# Patient Record
Sex: Female | Born: 1955
Health system: Southern US, Community
[De-identification: ages and names within clinical notes are randomized; demographics above are authoritative.]

## PROBLEM LIST (undated history)

## (undated) ENCOUNTER — Emergency Department (HOSPITAL_BASED_OUTPATIENT_CLINIC_OR_DEPARTMENT_OTHER): Admission: EM | Payer: Medicare Other | Source: Home / Self Care

## (undated) DIAGNOSIS — F32A Depression, unspecified: Secondary | ICD-10-CM

## (undated) DIAGNOSIS — F329 Major depressive disorder, single episode, unspecified: Secondary | ICD-10-CM

## (undated) DIAGNOSIS — E785 Hyperlipidemia, unspecified: Secondary | ICD-10-CM

## (undated) DIAGNOSIS — I509 Heart failure, unspecified: Secondary | ICD-10-CM

## (undated) DIAGNOSIS — I219 Acute myocardial infarction, unspecified: Secondary | ICD-10-CM

## (undated) HISTORY — DX: Acute myocardial infarction, unspecified: I21.9

## (undated) HISTORY — DX: Hyperlipidemia, unspecified: E78.5

## (undated) HISTORY — DX: Major depressive disorder, single episode, unspecified: F32.9

## (undated) HISTORY — DX: Depression, unspecified: F32.A

---

## 2002-03-24 ENCOUNTER — Inpatient Hospital Stay (HOSPITAL_COMMUNITY): Admission: AD | Admit: 2002-03-24 | Discharge: 2002-04-07 | Payer: Self-pay | Admitting: *Deleted

## 2002-03-24 ENCOUNTER — Encounter: Payer: Self-pay | Admitting: *Deleted

## 2002-03-25 ENCOUNTER — Encounter: Payer: Self-pay | Admitting: Cardiothoracic Surgery

## 2002-03-28 ENCOUNTER — Encounter: Payer: Self-pay | Admitting: Cardiothoracic Surgery

## 2002-03-29 ENCOUNTER — Encounter: Payer: Self-pay | Admitting: Cardiothoracic Surgery

## 2002-03-30 ENCOUNTER — Encounter: Payer: Self-pay | Admitting: Cardiothoracic Surgery

## 2002-03-31 ENCOUNTER — Encounter: Payer: Self-pay | Admitting: Cardiothoracic Surgery

## 2002-04-01 ENCOUNTER — Encounter: Payer: Self-pay | Admitting: Cardiothoracic Surgery

## 2002-04-03 ENCOUNTER — Encounter: Payer: Self-pay | Admitting: Cardiothoracic Surgery

## 2002-04-04 ENCOUNTER — Encounter: Payer: Self-pay | Admitting: Pulmonary Disease

## 2002-04-05 ENCOUNTER — Encounter: Payer: Self-pay | Admitting: Cardiothoracic Surgery

## 2002-05-12 ENCOUNTER — Encounter: Admission: RE | Admit: 2002-05-12 | Discharge: 2002-05-12 | Payer: Self-pay | Admitting: Cardiothoracic Surgery

## 2002-05-12 ENCOUNTER — Encounter: Payer: Self-pay | Admitting: Cardiothoracic Surgery

## 2002-08-22 ENCOUNTER — Emergency Department (HOSPITAL_COMMUNITY): Admission: EM | Admit: 2002-08-22 | Discharge: 2002-08-22 | Payer: Self-pay | Admitting: Emergency Medicine

## 2011-12-24 DIAGNOSIS — F322 Major depressive disorder, single episode, severe without psychotic features: Secondary | ICD-10-CM | POA: Diagnosis not present

## 2012-02-04 DIAGNOSIS — H1030 Unspecified acute conjunctivitis, unspecified eye: Secondary | ICD-10-CM | POA: Diagnosis not present

## 2012-03-08 DIAGNOSIS — Z954 Presence of other heart-valve replacement: Secondary | ICD-10-CM | POA: Diagnosis not present

## 2012-03-08 DIAGNOSIS — E785 Hyperlipidemia, unspecified: Secondary | ICD-10-CM | POA: Diagnosis not present

## 2012-03-08 DIAGNOSIS — J209 Acute bronchitis, unspecified: Secondary | ICD-10-CM | POA: Diagnosis not present

## 2012-03-08 DIAGNOSIS — M549 Dorsalgia, unspecified: Secondary | ICD-10-CM | POA: Diagnosis not present

## 2012-03-17 DIAGNOSIS — F322 Major depressive disorder, single episode, severe without psychotic features: Secondary | ICD-10-CM | POA: Diagnosis not present

## 2012-03-28 DIAGNOSIS — Z954 Presence of other heart-valve replacement: Secondary | ICD-10-CM | POA: Diagnosis not present

## 2012-03-28 DIAGNOSIS — I2510 Atherosclerotic heart disease of native coronary artery without angina pectoris: Secondary | ICD-10-CM | POA: Diagnosis not present

## 2012-03-28 DIAGNOSIS — J449 Chronic obstructive pulmonary disease, unspecified: Secondary | ICD-10-CM | POA: Diagnosis not present

## 2012-03-28 DIAGNOSIS — Z951 Presence of aortocoronary bypass graft: Secondary | ICD-10-CM | POA: Diagnosis not present

## 2012-03-28 DIAGNOSIS — Z9861 Coronary angioplasty status: Secondary | ICD-10-CM | POA: Diagnosis not present

## 2012-06-22 DIAGNOSIS — E785 Hyperlipidemia, unspecified: Secondary | ICD-10-CM | POA: Diagnosis not present

## 2012-06-22 DIAGNOSIS — Z954 Presence of other heart-valve replacement: Secondary | ICD-10-CM | POA: Diagnosis not present

## 2012-06-22 DIAGNOSIS — I2510 Atherosclerotic heart disease of native coronary artery without angina pectoris: Secondary | ICD-10-CM | POA: Diagnosis not present

## 2012-06-22 DIAGNOSIS — Z7901 Long term (current) use of anticoagulants: Secondary | ICD-10-CM | POA: Diagnosis not present

## 2012-06-22 DIAGNOSIS — I4891 Unspecified atrial fibrillation: Secondary | ICD-10-CM | POA: Diagnosis not present

## 2012-06-22 LAB — CBC AND DIFFERENTIAL
Platelets: 204 10*3/uL (ref 150–399)
WBC: 6.6 10^3/mL

## 2012-06-22 LAB — HEPATIC FUNCTION PANEL: AST: 29 U/L (ref 13–35)

## 2012-06-22 LAB — LIPID PANEL: Cholesterol: 149 mg/dL (ref 0–200)

## 2012-08-12 DIAGNOSIS — Z7901 Long term (current) use of anticoagulants: Secondary | ICD-10-CM | POA: Diagnosis not present

## 2012-08-12 DIAGNOSIS — Z954 Presence of other heart-valve replacement: Secondary | ICD-10-CM | POA: Diagnosis not present

## 2012-08-26 DIAGNOSIS — J069 Acute upper respiratory infection, unspecified: Secondary | ICD-10-CM | POA: Diagnosis not present

## 2012-08-29 DIAGNOSIS — Z124 Encounter for screening for malignant neoplasm of cervix: Secondary | ICD-10-CM | POA: Diagnosis not present

## 2012-08-29 DIAGNOSIS — Z01419 Encounter for gynecological examination (general) (routine) without abnormal findings: Secondary | ICD-10-CM | POA: Diagnosis not present

## 2012-09-08 DIAGNOSIS — F322 Major depressive disorder, single episode, severe without psychotic features: Secondary | ICD-10-CM | POA: Diagnosis not present

## 2012-09-21 DIAGNOSIS — Z1231 Encounter for screening mammogram for malignant neoplasm of breast: Secondary | ICD-10-CM | POA: Diagnosis not present

## 2012-10-12 DIAGNOSIS — Z7901 Long term (current) use of anticoagulants: Secondary | ICD-10-CM | POA: Diagnosis not present

## 2012-10-15 DIAGNOSIS — K219 Gastro-esophageal reflux disease without esophagitis: Secondary | ICD-10-CM | POA: Diagnosis not present

## 2012-10-15 DIAGNOSIS — F411 Generalized anxiety disorder: Secondary | ICD-10-CM | POA: Diagnosis not present

## 2012-10-15 DIAGNOSIS — Z7901 Long term (current) use of anticoagulants: Secondary | ICD-10-CM | POA: Diagnosis not present

## 2012-10-15 DIAGNOSIS — Z79899 Other long term (current) drug therapy: Secondary | ICD-10-CM | POA: Diagnosis not present

## 2012-10-15 DIAGNOSIS — R112 Nausea with vomiting, unspecified: Secondary | ICD-10-CM | POA: Diagnosis not present

## 2012-10-15 DIAGNOSIS — I25810 Atherosclerosis of coronary artery bypass graft(s) without angina pectoris: Secondary | ICD-10-CM | POA: Diagnosis not present

## 2012-10-15 DIAGNOSIS — R109 Unspecified abdominal pain: Secondary | ICD-10-CM | POA: Diagnosis not present

## 2012-10-15 DIAGNOSIS — K5289 Other specified noninfective gastroenteritis and colitis: Secondary | ICD-10-CM | POA: Diagnosis not present

## 2012-10-15 DIAGNOSIS — F17200 Nicotine dependence, unspecified, uncomplicated: Secondary | ICD-10-CM | POA: Diagnosis not present

## 2012-10-15 DIAGNOSIS — R1084 Generalized abdominal pain: Secondary | ICD-10-CM | POA: Diagnosis not present

## 2012-10-15 DIAGNOSIS — Z885 Allergy status to narcotic agent status: Secondary | ICD-10-CM | POA: Diagnosis not present

## 2012-10-15 DIAGNOSIS — Z881 Allergy status to other antibiotic agents status: Secondary | ICD-10-CM | POA: Diagnosis not present

## 2012-10-15 DIAGNOSIS — F329 Major depressive disorder, single episode, unspecified: Secondary | ICD-10-CM | POA: Diagnosis not present

## 2012-10-15 DIAGNOSIS — R197 Diarrhea, unspecified: Secondary | ICD-10-CM | POA: Diagnosis not present

## 2012-10-15 DIAGNOSIS — K2990 Gastroduodenitis, unspecified, without bleeding: Secondary | ICD-10-CM | POA: Diagnosis not present

## 2012-10-15 DIAGNOSIS — K2970 Gastritis, unspecified, without bleeding: Secondary | ICD-10-CM | POA: Diagnosis not present

## 2012-10-26 DIAGNOSIS — M549 Dorsalgia, unspecified: Secondary | ICD-10-CM | POA: Diagnosis not present

## 2012-10-26 DIAGNOSIS — R110 Nausea: Secondary | ICD-10-CM | POA: Diagnosis not present

## 2012-11-04 DIAGNOSIS — Z902 Acquired absence of lung [part of]: Secondary | ICD-10-CM | POA: Diagnosis not present

## 2012-11-04 DIAGNOSIS — E876 Hypokalemia: Secondary | ICD-10-CM | POA: Diagnosis not present

## 2012-11-04 DIAGNOSIS — I079 Rheumatic tricuspid valve disease, unspecified: Secondary | ICD-10-CM | POA: Diagnosis present

## 2012-11-04 DIAGNOSIS — Z9089 Acquired absence of other organs: Secondary | ICD-10-CM | POA: Diagnosis not present

## 2012-11-04 DIAGNOSIS — R110 Nausea: Secondary | ICD-10-CM | POA: Diagnosis not present

## 2012-11-04 DIAGNOSIS — R1084 Generalized abdominal pain: Secondary | ICD-10-CM | POA: Diagnosis not present

## 2012-11-04 DIAGNOSIS — J45909 Unspecified asthma, uncomplicated: Secondary | ICD-10-CM | POA: Diagnosis present

## 2012-11-04 DIAGNOSIS — F329 Major depressive disorder, single episode, unspecified: Secondary | ICD-10-CM | POA: Diagnosis present

## 2012-11-04 DIAGNOSIS — I10 Essential (primary) hypertension: Secondary | ICD-10-CM | POA: Diagnosis present

## 2012-11-04 DIAGNOSIS — R791 Abnormal coagulation profile: Secondary | ICD-10-CM | POA: Diagnosis present

## 2012-11-04 DIAGNOSIS — F411 Generalized anxiety disorder: Secondary | ICD-10-CM | POA: Diagnosis present

## 2012-11-04 DIAGNOSIS — M2550 Pain in unspecified joint: Secondary | ICD-10-CM | POA: Diagnosis not present

## 2012-11-04 DIAGNOSIS — E785 Hyperlipidemia, unspecified: Secondary | ICD-10-CM | POA: Diagnosis present

## 2012-11-04 DIAGNOSIS — M6789 Other specified disorders of synovium and tendon, multiple sites: Secondary | ICD-10-CM | POA: Diagnosis not present

## 2012-11-04 DIAGNOSIS — R112 Nausea with vomiting, unspecified: Secondary | ICD-10-CM | POA: Diagnosis not present

## 2012-11-04 DIAGNOSIS — Z7982 Long term (current) use of aspirin: Secondary | ICD-10-CM | POA: Diagnosis not present

## 2012-11-04 DIAGNOSIS — Z951 Presence of aortocoronary bypass graft: Secondary | ICD-10-CM | POA: Diagnosis not present

## 2012-11-04 DIAGNOSIS — J438 Other emphysema: Secondary | ICD-10-CM | POA: Diagnosis present

## 2012-11-04 DIAGNOSIS — R109 Unspecified abdominal pain: Secondary | ICD-10-CM | POA: Diagnosis not present

## 2012-11-04 DIAGNOSIS — I2789 Other specified pulmonary heart diseases: Secondary | ICD-10-CM | POA: Diagnosis not present

## 2012-11-04 DIAGNOSIS — T45511A Poisoning by anticoagulants, accidental (unintentional), initial encounter: Secondary | ICD-10-CM | POA: Diagnosis not present

## 2012-11-04 DIAGNOSIS — R197 Diarrhea, unspecified: Secondary | ICD-10-CM | POA: Diagnosis not present

## 2012-11-04 DIAGNOSIS — I2510 Atherosclerotic heart disease of native coronary artery without angina pectoris: Secondary | ICD-10-CM | POA: Diagnosis present

## 2012-11-04 DIAGNOSIS — Z952 Presence of prosthetic heart valve: Secondary | ICD-10-CM | POA: Diagnosis not present

## 2012-11-04 DIAGNOSIS — I2589 Other forms of chronic ischemic heart disease: Secondary | ICD-10-CM | POA: Diagnosis present

## 2012-11-04 DIAGNOSIS — R82998 Other abnormal findings in urine: Secondary | ICD-10-CM | POA: Diagnosis not present

## 2012-11-04 DIAGNOSIS — R233 Spontaneous ecchymoses: Secondary | ICD-10-CM | POA: Diagnosis not present

## 2012-11-04 DIAGNOSIS — M76899 Other specified enthesopathies of unspecified lower limb, excluding foot: Secondary | ICD-10-CM | POA: Diagnosis present

## 2012-11-04 DIAGNOSIS — I059 Rheumatic mitral valve disease, unspecified: Secondary | ICD-10-CM | POA: Diagnosis present

## 2012-11-04 DIAGNOSIS — N10 Acute tubulo-interstitial nephritis: Secondary | ICD-10-CM | POA: Diagnosis not present

## 2012-11-04 DIAGNOSIS — M25559 Pain in unspecified hip: Secondary | ICD-10-CM | POA: Diagnosis not present

## 2012-11-04 DIAGNOSIS — R935 Abnormal findings on diagnostic imaging of other abdominal regions, including retroperitoneum: Secondary | ICD-10-CM | POA: Diagnosis not present

## 2012-11-04 DIAGNOSIS — F17200 Nicotine dependence, unspecified, uncomplicated: Secondary | ICD-10-CM | POA: Diagnosis not present

## 2012-11-04 DIAGNOSIS — N12 Tubulo-interstitial nephritis, not specified as acute or chronic: Secondary | ICD-10-CM | POA: Diagnosis not present

## 2012-11-04 DIAGNOSIS — R310 Gross hematuria: Secondary | ICD-10-CM | POA: Diagnosis present

## 2012-11-04 DIAGNOSIS — R1032 Left lower quadrant pain: Secondary | ICD-10-CM | POA: Diagnosis not present

## 2012-11-04 DIAGNOSIS — N23 Unspecified renal colic: Secondary | ICD-10-CM | POA: Diagnosis not present

## 2012-11-04 DIAGNOSIS — R1110 Vomiting, unspecified: Secondary | ICD-10-CM | POA: Diagnosis not present

## 2012-11-14 DIAGNOSIS — R319 Hematuria, unspecified: Secondary | ICD-10-CM | POA: Diagnosis not present

## 2012-11-14 DIAGNOSIS — Z954 Presence of other heart-valve replacement: Secondary | ICD-10-CM | POA: Diagnosis not present

## 2012-11-14 DIAGNOSIS — Z7901 Long term (current) use of anticoagulants: Secondary | ICD-10-CM | POA: Diagnosis not present

## 2012-11-21 DIAGNOSIS — Z954 Presence of other heart-valve replacement: Secondary | ICD-10-CM | POA: Diagnosis not present

## 2012-11-21 DIAGNOSIS — Z7901 Long term (current) use of anticoagulants: Secondary | ICD-10-CM | POA: Diagnosis not present

## 2012-12-08 DIAGNOSIS — Z954 Presence of other heart-valve replacement: Secondary | ICD-10-CM | POA: Diagnosis not present

## 2012-12-08 DIAGNOSIS — Z7901 Long term (current) use of anticoagulants: Secondary | ICD-10-CM | POA: Diagnosis not present

## 2012-12-09 DIAGNOSIS — R109 Unspecified abdominal pain: Secondary | ICD-10-CM | POA: Diagnosis not present

## 2012-12-09 DIAGNOSIS — R110 Nausea: Secondary | ICD-10-CM | POA: Diagnosis not present

## 2012-12-23 DIAGNOSIS — K209 Esophagitis, unspecified without bleeding: Secondary | ICD-10-CM | POA: Diagnosis not present

## 2012-12-23 DIAGNOSIS — R1013 Epigastric pain: Secondary | ICD-10-CM | POA: Diagnosis not present

## 2012-12-23 DIAGNOSIS — K259 Gastric ulcer, unspecified as acute or chronic, without hemorrhage or perforation: Secondary | ICD-10-CM | POA: Diagnosis not present

## 2012-12-23 DIAGNOSIS — R110 Nausea: Secondary | ICD-10-CM | POA: Diagnosis not present

## 2012-12-23 DIAGNOSIS — K3189 Other diseases of stomach and duodenum: Secondary | ICD-10-CM | POA: Diagnosis not present

## 2012-12-23 DIAGNOSIS — K2960 Other gastritis without bleeding: Secondary | ICD-10-CM | POA: Diagnosis not present

## 2012-12-27 DIAGNOSIS — Z7901 Long term (current) use of anticoagulants: Secondary | ICD-10-CM | POA: Diagnosis not present

## 2012-12-27 DIAGNOSIS — Z954 Presence of other heart-valve replacement: Secondary | ICD-10-CM | POA: Diagnosis not present

## 2013-01-16 DIAGNOSIS — Z7901 Long term (current) use of anticoagulants: Secondary | ICD-10-CM | POA: Diagnosis not present

## 2013-01-16 DIAGNOSIS — Z954 Presence of other heart-valve replacement: Secondary | ICD-10-CM | POA: Diagnosis not present

## 2013-01-18 DIAGNOSIS — R319 Hematuria, unspecified: Secondary | ICD-10-CM | POA: Diagnosis not present

## 2013-01-18 DIAGNOSIS — N329 Bladder disorder, unspecified: Secondary | ICD-10-CM | POA: Diagnosis not present

## 2013-01-24 DIAGNOSIS — N281 Cyst of kidney, acquired: Secondary | ICD-10-CM | POA: Diagnosis not present

## 2013-01-24 DIAGNOSIS — R319 Hematuria, unspecified: Secondary | ICD-10-CM | POA: Diagnosis not present

## 2013-01-31 ENCOUNTER — Encounter: Payer: Self-pay | Admitting: Family Medicine

## 2013-01-31 ENCOUNTER — Ambulatory Visit (INDEPENDENT_AMBULATORY_CARE_PROVIDER_SITE_OTHER): Payer: Self-pay | Admitting: Family Medicine

## 2013-01-31 VITALS — BP 113/64 | HR 71 | Ht 63.25 in | Wt 164.0 lb

## 2013-01-31 DIAGNOSIS — E876 Hypokalemia: Secondary | ICD-10-CM | POA: Diagnosis not present

## 2013-01-31 DIAGNOSIS — K219 Gastro-esophageal reflux disease without esophagitis: Secondary | ICD-10-CM | POA: Insufficient documentation

## 2013-01-31 DIAGNOSIS — M549 Dorsalgia, unspecified: Secondary | ICD-10-CM | POA: Insufficient documentation

## 2013-01-31 DIAGNOSIS — E785 Hyperlipidemia, unspecified: Secondary | ICD-10-CM

## 2013-01-31 DIAGNOSIS — Z9889 Other specified postprocedural states: Secondary | ICD-10-CM

## 2013-01-31 DIAGNOSIS — Z7901 Long term (current) use of anticoagulants: Secondary | ICD-10-CM

## 2013-01-31 DIAGNOSIS — F419 Anxiety disorder, unspecified: Secondary | ICD-10-CM

## 2013-01-31 DIAGNOSIS — Z954 Presence of other heart-valve replacement: Secondary | ICD-10-CM

## 2013-01-31 DIAGNOSIS — Z952 Presence of prosthetic heart valve: Secondary | ICD-10-CM | POA: Insufficient documentation

## 2013-01-31 DIAGNOSIS — Z131 Encounter for screening for diabetes mellitus: Secondary | ICD-10-CM | POA: Diagnosis not present

## 2013-01-31 DIAGNOSIS — M25569 Pain in unspecified knee: Secondary | ICD-10-CM

## 2013-01-31 DIAGNOSIS — F17200 Nicotine dependence, unspecified, uncomplicated: Secondary | ICD-10-CM

## 2013-01-31 DIAGNOSIS — Z951 Presence of aortocoronary bypass graft: Secondary | ICD-10-CM | POA: Insufficient documentation

## 2013-01-31 DIAGNOSIS — G8929 Other chronic pain: Secondary | ICD-10-CM | POA: Insufficient documentation

## 2013-01-31 DIAGNOSIS — F32A Depression, unspecified: Secondary | ICD-10-CM

## 2013-01-31 DIAGNOSIS — M25561 Pain in right knee: Secondary | ICD-10-CM | POA: Insufficient documentation

## 2013-01-31 DIAGNOSIS — F341 Dysthymic disorder: Secondary | ICD-10-CM

## 2013-01-31 DIAGNOSIS — K589 Irritable bowel syndrome without diarrhea: Secondary | ICD-10-CM | POA: Insufficient documentation

## 2013-01-31 DIAGNOSIS — F329 Major depressive disorder, single episode, unspecified: Secondary | ICD-10-CM

## 2013-01-31 DIAGNOSIS — I2510 Atherosclerotic heart disease of native coronary artery without angina pectoris: Secondary | ICD-10-CM

## 2013-01-31 LAB — POCT INR: INR: 3.1

## 2013-01-31 NOTE — Progress Notes (Signed)
CC: Janet Joyce is a 57 y.o. female is here for Establish Care   Subjective: HPI:  Patient presents to establish care with no acute complaints  Chronic back pain: She describes chronic axial low back pain that she's been receiving hydrocodone and oxycodone from her prior provider. She tells me she has a pain management clinic appointment in early March oh prefer to have a PCP managing her narcotics. She denies change in the severity or character of the pain over the last year. She denies motor sensory disturbances, bowel or bladder dysfunction, nor saddle paresthesia. She denies a chronic knee and shoulder pain. She describes the pain as moderate in severity and worse with inactivity, slightly improves with activity. Denies weakness. Admits to remote history of giving way.  Reports a history of coronary artery disease currently seeing Dr. Leeann Must. She states she has angina on a weekly basis and has not changed in character or severity. She tells me that she sees Dr. Leeann Must frequently and they're currently managing this medically. She is taking and/or and a beta blocker. She takes aspirin daily. She is on statin   History of hypercholesterolemia: She is unsure what her most recent LDL was. She's pretty sure that prior lipid panels were not done fasting. She is taking Crestor without right upper quadrant pain, mild as, nor skin or scleral discoloration. She denies limb claudication  History of aortic valve replacement. She's requesting Coumadin management to be performed by our office. She tells me that about a month ago her INR was 20 and she was bleeding from the eyes. She was hospitalized but is unsure what happened during this hospitalization. Most recent INR was 4.9 on the third of this month. She has been taking 2.5 mg of Coumadin on a daily basis except for Sunday for the past week and a half. She denies bleeding or bruisability for the past 2 weeks.  History of anxiety and depression  which she states is fantastic and well controlled currently being managed by her psychiatrist. Denies mental disturbance currently  History of hypokalemia: She is taking potassium on a daily basis but only taking Lasix intermittently for peripheral edema. She's unsure when her potassium level was checked last. She denies regular heartbeat, also spasm, muscle cramps, weakness, nor confusion.  Review of Systems - General ROS: negative for - chills, fever, night sweats, weight gain or weight loss Ophthalmic ROS: negative for - decreased vision Psychological ROS: negative for - worsening anxiety or depression ENT ROS: negative for - hearing change, nasal congestion, tinnitus or allergies Hematological and Lymphatic ROS: negative for - bleeding problems, bruising or swollen lymph nodes other than that described above Breast ROS: negative Respiratory ROS: no cough, shortness of breath, or wheezing Cardiovascular ROS: No worsening angina Gastrointestinal ROS: no abdominal pain, change in bowel habits, or black or bloody stools Genito-Urinary ROS: negative for - genital discharge, genital ulcers, incontinence or abnormal bleeding from genitals Musculoskeletal ROS: negative for - joint pain or muscle pain other than that described above Neurological ROS: negative for - headaches or memory loss Dermatological ROS: negative for lumps, mole changes, rash and skin lesion changes  Past Medical History  Diagnosis Date  . Heart attack   . Hyperlipidemia   . Depression      Family History  Problem Relation Age of Onset  . Diabetes Father     other than father with history of type 2 diabetes  History  Substance Use Topics  . Smoking status: Current Every Day  Smoker -- 0.50 packs/day    Types: Cigarettes  . Smokeless tobacco: Not on file  . Alcohol Use: No     Objective: Filed Vitals:   01/31/13 1401  BP: 113/64  Pulse: 71    General: Alert and Oriented, No Acute Distress HEENT: Pupils  equal, round, reactive to light. Conjunctivae clear.  External ears unremarkable, canals clear with intact TMs with appropriate landmarks.  Middle ear appears open without effusion. Pink inferior turbinates.  Moist mucous membranes, pharynx without inflammation nor lesions.  Neck supple without palpable lymphadenopathy nor abnormal masses. Lungs: Clear to auscultation bilaterally, no wheezing/ronchi/rales.  Comfortable work of breathing. Good air movement. Cardiac: Regular rate and rhythm.  Hyperplastic S1 normal S2.  No murmurs, rubs, nor gallops.   Abdomen: Normal bowel sounds, soft and non tender without palpable masses. Extremities: No peripheral edema.  Strong peripheral pulses.  Mental Status: No depression, anxiety, nor agitation. Skin: Warm and dry.  Assessment & Plan: Deborha was seen today for establish care.  Diagnoses and associated orders for this visit:  H/O aortic valve replacement - POCT INR  CAD (coronary artery disease)  Hyperlipidemia LDL goal <70 - Lipid panel - COMPLETE METABOLIC PANEL WITH GFR  Anxiety and depression  Hypokalemia - COMPLETE METABOLIC PANEL WITH GFR  Diabetes mellitus screening - COMPLETE METABOLIC PANEL WITH GFR  Long-term (current) use of anticoagulants - CBC  Status post lung surgery  Tobacco use disorder  History of aortic valve replacement: Stable: INR today 3.1 at goal. Continue Coumadin 2.5 mg return in one week for recheck. Coronary artery disease: Stable, agree with medication regimen of beta blocker, statin, aspirin, nitrates, continue care with Dr. Leeann Must. Hyperlipidemia: Obtaining fasting lipid panel tomorrow Anxiety and depression: Stable, we'll defer care to her psychiatrist Hypokalemia: Will check potassium since she does not take potassium only with Lasix. Diabetes screening due Chronic back pain: Discussed that I prefer axial back pain to be managed by pain management clinic I be happy to help her with nonnarcotic  interventions Bilateral knee pain: Asked her to return 1 week and we can consider giving steroid injection to one of the knees.  45 minutes spent face-to-face during visit today of which at least 50% was counseling or coordinating care regarding chronic back pain: Bilateral knee pain, hypokalemia, anxiety depression, hyperlipidemia, coronary artery disease, history of aortic valve replacement requiring anticoagulation.    Return in about 1 week (around 02/07/2013).

## 2013-02-03 DIAGNOSIS — E785 Hyperlipidemia, unspecified: Secondary | ICD-10-CM | POA: Diagnosis not present

## 2013-02-03 LAB — COMPLETE METABOLIC PANEL WITHOUT GFR
ALT: 18 U/L (ref 0–35)
AST: 29 U/L (ref 0–37)
Albumin: 4.1 g/dL (ref 3.5–5.2)
Alkaline Phosphatase: 71 U/L (ref 39–117)
BUN: 9 mg/dL (ref 6–23)
CO2: 26 meq/L (ref 19–32)
Calcium: 9.4 mg/dL (ref 8.4–10.5)
Chloride: 105 meq/L (ref 96–112)
Creat: 0.75 mg/dL (ref 0.50–1.10)
GFR, Est African American: >89 mL/min
GFR, Est Non African American: >89 mL/min
Glucose, Bld: 104 mg/dL — ABNORMAL HIGH (ref 70–99)
Potassium: 4.2 meq/L (ref 3.5–5.3)
Sodium: 139 meq/L (ref 135–145)
Total Bilirubin: 0.4 mg/dL (ref 0.3–1.2)
Total Protein: 6.5 g/dL (ref 6.0–8.3)

## 2013-02-03 LAB — CBC
HCT: 43.8 % (ref 36.0–46.0)
Hemoglobin: 14.2 g/dL (ref 12.0–15.0)
MCH: 26.3 pg (ref 26.0–34.0)
RBC: 5.39 MIL/uL — ABNORMAL HIGH (ref 3.87–5.11)

## 2013-02-03 LAB — LIPID PANEL
Cholesterol: 146 mg/dL (ref 0–200)
HDL: 37 mg/dL — ABNORMAL LOW
LDL Cholesterol: 62 mg/dL (ref 0–99)
Total CHOL/HDL Ratio: 3.9 ratio
Triglycerides: 234 mg/dL — ABNORMAL HIGH
VLDL: 47 mg/dL — ABNORMAL HIGH (ref 0–40)

## 2013-02-06 ENCOUNTER — Encounter: Payer: Self-pay | Admitting: Family Medicine

## 2013-02-06 ENCOUNTER — Telehealth: Payer: Self-pay | Admitting: Family Medicine

## 2013-02-06 MED ORDER — NIACIN ER (ANTIHYPERLIPIDEMIC) 1000 MG PO TBCR: 1000.0000 mg | EXTENDED_RELEASE_TABLET | Freq: Every day | ORAL | Status: DC

## 2013-02-06 NOTE — Telephone Encounter (Signed)
Left message on vm

## 2013-02-06 NOTE — Telephone Encounter (Signed)
Sue Lush, Will you please let mrs. Cronic know that her LDL-cholesterol looks fantastic, well below a goal of 70.  Her triglycerides are elevated but she's likely getting some benefit from the cholesterol medication she's currently on.  I'd like her to adjust her niacin to an extended release formulation with a little higher dose to help with the triglycerides.  I have sent an Rx to her [facility], this new rx will take the place of the niacin she's been taking.

## 2013-02-07 ENCOUNTER — Ambulatory Visit (INDEPENDENT_AMBULATORY_CARE_PROVIDER_SITE_OTHER): Payer: Medicare Other | Admitting: Family Medicine

## 2013-02-07 ENCOUNTER — Encounter: Payer: Self-pay | Admitting: Family Medicine

## 2013-02-07 VITALS — BP 132/74 | HR 67 | Wt 163.0 lb

## 2013-02-07 DIAGNOSIS — M76899 Other specified enthesopathies of unspecified lower limb, excluding foot: Secondary | ICD-10-CM | POA: Diagnosis not present

## 2013-02-07 DIAGNOSIS — M7061 Trochanteric bursitis, right hip: Secondary | ICD-10-CM

## 2013-02-07 DIAGNOSIS — Z952 Presence of prosthetic heart valve: Secondary | ICD-10-CM

## 2013-02-07 DIAGNOSIS — E781 Pure hyperglyceridemia: Secondary | ICD-10-CM

## 2013-02-07 DIAGNOSIS — R7301 Impaired fasting glucose: Secondary | ICD-10-CM | POA: Diagnosis not present

## 2013-02-07 MED ORDER — WARFARIN SODIUM 2 MG PO TABS: ORAL_TABLET | ORAL | Status: DC

## 2013-02-07 NOTE — Patient Instructions (Addendum)
Current Coumadin Dosing Instructions: Alternate Coumadin 2.5mg  dose with 2.0mg  dose (called in to [facility]) every other day.  Repeat INR in 1 week with nurse visit.  New triglyceride medication: Niacin 1000mg  CR taken every evening before bed.  If this causes flushing take your daily aspirin along with this medication.

## 2013-02-07 NOTE — Progress Notes (Signed)
CC: Janet Joyce is a 57 y.o. female is here for Knee Pain and Coagulation Disorder   Subjective: HPI:  Aortic Valve Replacement: INR 3.1 last week, today 3.6 while taking Coumadin 2.5mg  daily. Denies missed doses, bleeding, bruising, nor new motor or sensory disturbances. Denies shortness of breath or chest pain.  At her last visit she was found to have triglycerides in the mid 200s while on statin, her LDL is below goal of 70. Med list reflected that she was on niacin however during discussion today she is pretty certain that she is never taken this medication before. She tries to watch her she eats, no formal exercise program. No recent elevated LFTs nor known history of fatty liver disease. Denies epigastric pain nor right upper quadrant pain, no skin or scleral discoloration.  At her last visit fasting glucose was just above 100. She does not believe that her glucoses never been elevated before. Family history significant for father with type 2 diabetes. She denies polyphagia polyuria nor polydipsia. She denies poorly healing wounds nor tingling or numbness of the extremities.  She complains of bilateral hip pain. Moderate in severity, worse after walking long distances or immediately when rolling over in her sleep. Pain is localized on the lateral aspects of her hips bilaterally the upper femur and she denies any groin pain or buttock pain. Pain often radiates down the lateral side of her femur. She admits that she does have a snapping sensation occasionally with the pain while walking. This pain has been present for months to years and she believes she may have had a steroid injection, she's not sure, but is sure it was over a year ago. She denies motor or sensory disturbances in the lower extremities. She denies any recent or remote trauma to the lower extremities or pelvis.  Review Of Systems Outlined In HPI  Past Medical History  Diagnosis Date  . Heart attack   . Hyperlipidemia   .  Depression      Family History  Problem Relation Age of Onset  . Diabetes Father      History  Substance Use Topics  . Smoking status: Current Every Day Smoker -- 0.50 packs/day    Types: Cigarettes  . Smokeless tobacco: Not on file  . Alcohol Use: No     Objective: Filed Vitals:   02/07/13 1142  BP: 132/74  Pulse: 67    General: Alert and Oriented, No Acute Distress HEENT: Pupils equal, round, reactive to light. Conjunctivae clear.  Moist mucous membranes, pharynx clear Lungs: Clear to auscultation bilaterally, no wheezing/ronchi/rales.  Comfortable work of breathing. Good air movement. Cardiac: Regular rate and rhythm. Mechanical S2.  No murmurs, rubs, nor gallops.   Abdomen: Obese soft nontender Extremities: No peripheral edema.  Strong peripheral pulses. Right lower extremity: FABER and FADIR negative, Straight leg raise negative, no pain with internal or external rotation of the femur, she has full range of motion strength in the right lower extremity with symmetric internal and external rotation range of motion laterally, L4-S1 DTRs in the right lower extremities two over four. Pain is reproduced with palpation of greater trochanter on the right. Mental Status: No depression, anxiety, nor agitation. Skin: Warm and dry.  Assessment & Plan: Janet Joyce was seen today for knee pain and coagulation disorder.  Diagnoses and associated orders for this visit:  H/O aortic valve replacement - warfarin (COUMADIN) 2 MG tablet; Alternate one 2mg  tab with one 2.5mg  dose every other day.  Hypertriglyceridemia  Elevated  fasting blood sugar - HgB A1c  Greater trochanteric bursitis, right    Anticoagulation for aortic valve replacement: Supratherapeutic at 3.6 with goal INR 2.5-3.5. Lowering Coumadin regimen to 2 mg and 2.5 mg alternating every other day. Repeat INR one week Hypertriglyceridemia: Uncontrolled, Start nightly niacin Elevated fasting blood sugar: Uncontrolled,  Obtaining hemoglobin A1c to rule out type 2 diabetes Right greater trochanteric bursitis: Patient is open to the idea of steroid injection, please see procedure note below.    Return in about 1 week (around 02/14/2013).   Bursa Aspiration with Injection Procedure Note  Pre-operative Diagnosis: right Trochanteric bursitis  Post-operative Diagnosis: same  Indications: Diagnosis and treatment of symptomatic bursal effusion  Anesthesia: cold topical spray   Procedure Details   After a discussion of the risks and benefits with the patient (including the possibility that any manipulation of the bursa could introduce infection, worsening the current situation significantly), verbal consent was obtained for the procedure. The joint was prepped with alcohol and aaerosol cold spray was applied. An 21 gauge needle was introduced into the fluid collection and 1 ml of serosanguinous fluid was removed and discarded. After the fluid was removed, 2 ml of triamcinolone (KENALOG) 40mg /ml was injected into the bursa with 3mL 2% Lidocaine. The injection site was cleansed with topical isopropyl alcohol and a dressing was applied.  Complications:  None; patient tolerated the procedure well.

## 2013-02-13 ENCOUNTER — Telehealth: Payer: Self-pay | Admitting: *Deleted

## 2013-02-13 DIAGNOSIS — E781 Pure hyperglyceridemia: Secondary | ICD-10-CM

## 2013-02-13 MED ORDER — FENOFIBRATE 48 MG PO TABS: 48.0000 mg | ORAL_TABLET | Freq: Every day | ORAL | Status: DC

## 2013-02-13 NOTE — Telephone Encounter (Signed)
Pt.notified

## 2013-02-13 NOTE — Telephone Encounter (Signed)
Patient calls and states you put her on Niaspan and has been having night sweats, SOB, muscle pains, abdominal cramping ever since and can not take this. Please advise

## 2013-02-13 NOTE — Telephone Encounter (Signed)
Sue Lush, Will you please let Mrs. Jaskot know that she can stop the niaspan immediately if she'd like and I'm sorry it caused her such significant side effects.  I've sent in an alternative medication called Tricor/fenofibrate that works differently than Niaspan but will still address her triglycerides.

## 2013-02-15 ENCOUNTER — Telehealth: Payer: Self-pay | Admitting: *Deleted

## 2013-02-15 DIAGNOSIS — G894 Chronic pain syndrome: Secondary | ICD-10-CM | POA: Diagnosis not present

## 2013-02-15 DIAGNOSIS — R0789 Other chest pain: Secondary | ICD-10-CM | POA: Diagnosis not present

## 2013-02-15 NOTE — Telephone Encounter (Signed)
Patient calls and wanted to know why her Klonopin was denied. She states that when in for appt that you said you would refill it. She does see a psychiatrist but states that he does not prescribe this for her

## 2013-02-16 MED ORDER — CLONAZEPAM 0.5 MG PO TABS: ORAL_TABLET | ORAL | Status: DC

## 2013-02-16 NOTE — Telephone Encounter (Signed)
Pt.notified

## 2013-02-16 NOTE — Telephone Encounter (Signed)
Sue Lush, Will you let Mrs. Fanara know that I can only assume that our system assumed her psych was prescribing this and caused it to get blocked.  I've printed an rx and placed in your box to be sent or picke dup.

## 2013-02-20 ENCOUNTER — Encounter: Payer: Self-pay | Admitting: Family Medicine

## 2013-02-20 ENCOUNTER — Ambulatory Visit (INDEPENDENT_AMBULATORY_CARE_PROVIDER_SITE_OTHER): Payer: Medicare Other | Admitting: Family Medicine

## 2013-02-20 VITALS — BP 129/73 | HR 62 | Wt 158.0 lb

## 2013-02-20 DIAGNOSIS — E781 Pure hyperglyceridemia: Secondary | ICD-10-CM

## 2013-02-20 DIAGNOSIS — R7301 Impaired fasting glucose: Secondary | ICD-10-CM | POA: Diagnosis not present

## 2013-02-20 DIAGNOSIS — G8929 Other chronic pain: Secondary | ICD-10-CM

## 2013-02-20 DIAGNOSIS — Z7901 Long term (current) use of anticoagulants: Secondary | ICD-10-CM

## 2013-02-20 DIAGNOSIS — Z954 Presence of other heart-valve replacement: Secondary | ICD-10-CM

## 2013-02-20 DIAGNOSIS — G894 Chronic pain syndrome: Secondary | ICD-10-CM

## 2013-02-20 DIAGNOSIS — Z952 Presence of prosthetic heart valve: Secondary | ICD-10-CM

## 2013-02-20 DIAGNOSIS — I4891 Unspecified atrial fibrillation: Secondary | ICD-10-CM | POA: Insufficient documentation

## 2013-02-20 LAB — HEMOGLOBIN A1C: Mean Plasma Glucose: 126 mg/dL — ABNORMAL HIGH (ref <117)

## 2013-02-20 MED ORDER — WARFARIN SODIUM 5 MG PO TABS: 2.5000 mg | ORAL_TABLET | Freq: Every day | ORAL | Status: DC

## 2013-02-20 MED ORDER — OXYCODONE-ACETAMINOPHEN 10-325 MG PO TABS: 1.0000 | ORAL_TABLET | Freq: Four times a day (QID) | ORAL | Status: DC | PRN

## 2013-02-20 NOTE — Progress Notes (Signed)
CC: Janet Joyce is a 57 y.o. female is here for Coagulation Disorder and Follow-up   Subjective: HPI:  Followup hypertriglyceridemia: Patient reports niacin caused flushing and nausea which was intolerable. She has been on fenofibrate for 4 days now without any side effects. She denies right upper quadrant pain, skin or scleral discoloration, nor myalgias.  Followup hyperglycemia: She has yet to have an A1c drawn. Reviewed outside records revealing glucose values in the mid 100s however uncertain whether or not she was fasting for these episodes. She denies polyuria polyphasia or polydipsia. Denies vision loss, poorly healing wounds, nor motor or sensory disturbances.  Followup history of aortic valve replacement: She has been taking 2 mg and 2.5 mg of Coumadin on alternating every other day basis. No other medications have been adjusted other than starting fenofibrate. She denies dietary changes. Denies missed doses. Denies confusion, nor motor or sensory disturbances ear denies bleeding or easy bruisability.  Followup chronic pain: She reports being seen at a local pain management clinic and per her report she was told that they would not be able to help her until she "fixed" her pain medication regimen. She is pretty confused as to what exactly they expected from her prior to her next visit. She plans on discontinuing going to their office. She reports continued diffuse joint pain has not changed in character or severity in years. She's been out of opiates since the weekend.     Review Of Systems Outlined In HPI  Past Medical History  Diagnosis Date  . Heart attack   . Hyperlipidemia   . Depression      Family History  Problem Relation Age of Onset  . Diabetes Father      History  Substance Use Topics  . Smoking status: Current Every Day Smoker -- 0.50 packs/day    Types: Cigarettes  . Smokeless tobacco: Not on file  . Alcohol Use: No     Objective: Filed Vitals:   02/20/13 1123  BP: 129/73  Pulse: 62    General: Alert and Oriented, No Acute Distress HEENT: Pupils equal, round, reactive to light. Conjunctivae clear.  Moist membranes Lungs: Clear to auscultation bilaterally, no wheezing/ronchi/rales.  Comfortable work of breathing. Good air movement. Cardiac: Regular rate and rhythm. Mechanical S2.  No murmurs, rubs, nor gallops.   Extremities: No peripheral edema.  Strong peripheral pulses. Neuro: Cranial nerves II through XII grossly intact  Mental Status: No depression, anxiety, nor agitation. Skin: Warm and dry.  Assessment & Plan: Janet Joyce was seen today for coagulation disorder and follow-up.  Diagnoses and associated orders for this visit:  H/O aortic valve replacement - POCT INR - warfarin (COUMADIN) 5 MG tablet; Take 0.5 tablets (2.5 mg total) by mouth daily.  Elevated fasting blood sugar  Chronic pain syndrome  Hypertriglyceridemia  Other Orders - oxyCODONE-acetaminophen (PERCOCET) 10-325 MG per tablet; Take 1 tablet by mouth every 6 (six) hours as needed for pain.    History aortic valve replacement: Uncontrolled anticoagulation,Subtherapeutic INR today therefore increasing daily Coumadin to 2.5 mg. Repeat INR one week Elevated fasting blood sugar: She will have an A1c obtained today to determine if she has type 2 diabetes Chronic pain syndrome: Controlled and stable, Discussed my disagreement with her prior providers regimen of hydrocodone and oxycodone. We'll be willing to help wean her down using oxycodone/acetaminophen. Hypertriglyceridemia: Clinically stable, continue fenofibrate will recheck lipids 2 months.   Return in about 1 week (around 02/27/2013) for INR.

## 2013-02-21 ENCOUNTER — Encounter: Payer: Self-pay | Admitting: Family Medicine

## 2013-02-21 DIAGNOSIS — R7303 Prediabetes: Secondary | ICD-10-CM | POA: Insufficient documentation

## 2013-02-24 ENCOUNTER — Encounter: Payer: Self-pay | Admitting: *Deleted

## 2013-02-27 ENCOUNTER — Ambulatory Visit: Payer: Medicare Other

## 2013-03-01 ENCOUNTER — Telehealth: Payer: Self-pay | Admitting: *Deleted

## 2013-03-01 ENCOUNTER — Ambulatory Visit (INDEPENDENT_AMBULATORY_CARE_PROVIDER_SITE_OTHER): Payer: Medicare Other | Admitting: Family Medicine

## 2013-03-01 VITALS — BP 92/65 | HR 69

## 2013-03-01 DIAGNOSIS — I4891 Unspecified atrial fibrillation: Secondary | ICD-10-CM | POA: Diagnosis not present

## 2013-03-01 MED ORDER — WARFARIN SODIUM 3 MG PO TABS: 3.0000 mg | ORAL_TABLET | Freq: Every day | ORAL | Status: DC

## 2013-03-01 MED ORDER — CARVEDILOL 6.25 MG PO TABS: 3.1250 mg | ORAL_TABLET | Freq: Two times a day (BID) | ORAL | Status: DC

## 2013-03-01 NOTE — Progress Notes (Signed)
  Subjective:    Patient ID: Janet Joyce, female    DOB: 11/28/1956, 57 y.o.   MRN: [id number]  HPI    Review of Systems     Objective:   Physical Exam        Assessment & Plan:  Patient states she has had a H/A x 4 days and has dizziness upon standing

## 2013-03-01 NOTE — Patient Instructions (Signed)
Increase coumadin regimen to 3.0mg  daily.

## 2013-03-01 NOTE — Telephone Encounter (Signed)
Pt was notified about INR results and new medication instructions

## 2013-03-01 NOTE — Progress Notes (Signed)
Sue Lush, Will you please let Janet Joyce know that her INR was 1.8 compared to 1.7 last week.  I would encouarge her to increase her daily coumadin regimen to 3mg  daily and have sent in a new Rx to her [facility], a recheck will be needed in one week. Also, her blood pressure was quite low today which may explain her headaches and dizziness.  I would like her to cut her coreg/carvedilol in half, taking half a tablet twice a day.  Her [facility] was updated about this adjustment. F/U with me in one week.

## 2013-03-02 DIAGNOSIS — F322 Major depressive disorder, single episode, severe without psychotic features: Secondary | ICD-10-CM | POA: Diagnosis not present

## 2013-03-10 ENCOUNTER — Ambulatory Visit (INDEPENDENT_AMBULATORY_CARE_PROVIDER_SITE_OTHER): Payer: Medicare Other | Admitting: Family Medicine

## 2013-03-10 ENCOUNTER — Encounter: Payer: Self-pay | Admitting: Family Medicine

## 2013-03-10 VITALS — BP 125/73 | HR 65 | Wt 160.0 lb

## 2013-03-10 DIAGNOSIS — Z954 Presence of other heart-valve replacement: Secondary | ICD-10-CM

## 2013-03-10 DIAGNOSIS — Z7901 Long term (current) use of anticoagulants: Secondary | ICD-10-CM

## 2013-03-10 DIAGNOSIS — Z952 Presence of prosthetic heart valve: Secondary | ICD-10-CM

## 2013-03-10 DIAGNOSIS — Z7185 Encounter for immunization safety counseling: Secondary | ICD-10-CM

## 2013-03-10 DIAGNOSIS — Z7189 Other specified counseling: Secondary | ICD-10-CM

## 2013-03-10 LAB — POCT INR: INR: 3.1

## 2013-03-10 NOTE — Progress Notes (Signed)
CC: Janet Joyce is a 57 y.o. female is here for Coagulation Disorder   Subjective: HPI:  Followup Coumadin monitoring for history of aortic valve replacement. Patient reports taking 3 mg Coumadin daily basis without easy bruisability, nor bleeding issues. Denies chest pain, motor or sensory disturbances, shortness of breath, orthopnea, peripheral edema, hematuria, or bloody stools.   Review Of Systems Outlined In HPI  Past Medical History  Diagnosis Date  . Heart attack   . Hyperlipidemia   . Depression      Family History  Problem Relation Age of Onset  . Diabetes Father      History  Substance Use Topics  . Smoking status: Current Every Day Smoker -- 0.50 packs/day    Types: Cigarettes  . Smokeless tobacco: Not on file  . Alcohol Use: No     Objective: Filed Vitals:   03/10/13 1131  BP: 125/73  Pulse: 65    Vital signs reviewed. General: Alert and Oriented, No Acute Distress HEENT: Pupils equal, round, reactive to light. Conjunctivae clear.  External ears unremarkable.  Moist mucous membranes. Lungs: Clear and comfortable work of breathing, speaking in full sentences without accessory muscle use. Cardiac: Regular rate and rhythm. Mechanical S2 Neuro: CN II-XII grossly intact, gait normal. Extremities: No peripheral edema.  Strong peripheral pulses.  Mental Status: No depression, anxiety, nor agitation. Logical though process. Skin: Warm and dry.  Assessment & Plan: Janet Joyce was seen today for coagulation disorder.  Diagnoses and associated orders for this visit:  H/O aortic valve replacement - POCT INR  Immunization counseling  Encounter for monitoring coumadin therapy    Coumadin therapy monitoring: INR of 3.1 is at goal between 2.5-3.5. Continue 3 mg Coumadin daily and repeat INR one week We discussed benefits of colonoscopy for colon cancer screening patient declines We discussed benefits of tetanus booster as she does not know when her last one  was, she declines today.  Return in about 1 week (around 03/17/2013).

## 2013-03-17 DIAGNOSIS — N329 Bladder disorder, unspecified: Secondary | ICD-10-CM | POA: Diagnosis not present

## 2013-03-17 DIAGNOSIS — R319 Hematuria, unspecified: Secondary | ICD-10-CM | POA: Diagnosis not present

## 2013-03-24 ENCOUNTER — Ambulatory Visit (INDEPENDENT_AMBULATORY_CARE_PROVIDER_SITE_OTHER): Payer: Medicare Other | Admitting: Family Medicine

## 2013-03-24 ENCOUNTER — Encounter: Payer: Self-pay | Admitting: Family Medicine

## 2013-03-24 VITALS — BP 112/69 | HR 61 | Ht 63.25 in | Wt 160.0 lb

## 2013-03-24 DIAGNOSIS — G8929 Other chronic pain: Secondary | ICD-10-CM

## 2013-03-24 DIAGNOSIS — M549 Dorsalgia, unspecified: Secondary | ICD-10-CM

## 2013-03-24 DIAGNOSIS — R51 Headache: Secondary | ICD-10-CM | POA: Diagnosis not present

## 2013-03-24 DIAGNOSIS — Z952 Presence of prosthetic heart valve: Secondary | ICD-10-CM

## 2013-03-24 DIAGNOSIS — I4891 Unspecified atrial fibrillation: Secondary | ICD-10-CM

## 2013-03-24 LAB — POCT INR: INR: 3.2

## 2013-03-24 MED ORDER — OXYCODONE-ACETAMINOPHEN 10-325 MG PO TABS: 1.0000 | ORAL_TABLET | Freq: Four times a day (QID) | ORAL | Status: DC | PRN

## 2013-03-24 NOTE — Patient Instructions (Signed)
In decreasing order of effectiveness for sinus allergies: Zyrtec (generic), loratadine, fexofenadine

## 2013-03-24 NOTE — Progress Notes (Signed)
CC: Janet Joyce is a 57 y.o. female is here for Coagulation Disorder and medication managment   Subjective: HPI:  Followup aortic valve replacement with history of atrial fibrillation: Has been taking Coumadin 3 mg daily without missed doses. Denies bleeding abnormalities, blood in stool, tar-like stool, bruising issues. Denies motor or sensory disturbances, shortness of breath, palpitations, orthopnea, irregular heartbeat, nor chest pain.  Followup chronic back pain: Has been taking Percocet 4 times daily. Denies deterioration in pain severity or character or frequency since last visit. States the pain is not currently interfering with quality of life on current Percocet regimen. Denies motor or sensory disturbances in the extremities, bowel or bladder dysfunction, nor saddle paresthesia.  Patient complains of a headache. Localized on the front of her face, has been present for a little over a month, occurred soon after starting TriCor. Nothing seems to make better or worse. It is mild in severity, lasts for hours, occurs 3 or 4 times a week. Pain often radiates to the back of the head and neck. She's never had this before. Denies worse headache of life, not waking from sleep. Denies confusion, coordination difficulty or memory loss. Admits to stuffy nose and nasal congestion  Review Of Systems Outlined In HPI  Past Medical History  Diagnosis Date  . Heart attack   . Hyperlipidemia   . Depression      Family History  Problem Relation Age of Onset  . Diabetes Father      History  Substance Use Topics  . Smoking status: Current Every Day Smoker -- 0.50 packs/day    Types: Cigarettes  . Smokeless tobacco: Not on file  . Alcohol Use: No     Objective: Filed Vitals:   03/24/13 1125  BP: 112/69  Pulse: 61    General: Alert and Oriented, No Acute Distress HEENT: Pupils equal, round, reactive to light. Conjunctivae clear.  External ears unremarkable, canals clear with intact  TMs with appropriate landmarks.  Middle ear appears open without effusion. Boggy erythematous inferior turbinates with moderate mucoid discharge.  Moist mucous membranes, pharynx without inflammation nor lesions.  Neck supple without palpable lymphadenopathy nor abnormal masses. Lungs: Clear to auscultation bilaterally, no wheezing/ronchi/rales.  Comfortable work of breathing. Good air movement. Cardiac: Regular rate and rhythm. Mechanical S2.  No murmurs, rubs, nor gallops.   Extremities: No peripheral edema.  Strong peripheral pulses.  Mental Status: No depression, anxiety, nor agitation. Skin: Warm and dry.  Assessment & Plan: Bernette was seen today for coagulation disorder and medication managment.  Diagnoses and associated orders for this visit:  H/O aortic valve replacement - POCT INR  Chronic back pain - oxyCODONE-acetaminophen (PERCOCET) 10-325 MG per tablet; Take 1 tablet by mouth every 6 (six) hours as needed for pain.  Atrial fibrillation  Headache    History ordered valve replacement with atrial fibrillation: Controlled, regular rate and rhythm today, INR therapeutic 2 consecutive weeks therefore will space out for monthly. Chronic back pain: Controlled, continue Percocet regimen Headache: Suspicion of sinus headache due to allergies, start one of 3 options antihistamines written patient instructions section, if not improved in one to 2 weeks may consider stopping TriCor temporarily.  Return in about 4 weeks (around 04/21/2013) for INR Check.

## 2013-04-21 ENCOUNTER — Ambulatory Visit (INDEPENDENT_AMBULATORY_CARE_PROVIDER_SITE_OTHER): Payer: Medicare Other | Admitting: Family Medicine

## 2013-04-21 ENCOUNTER — Encounter: Payer: Self-pay | Admitting: Family Medicine

## 2013-04-21 VITALS — BP 98/63 | HR 68 | Wt 160.0 lb

## 2013-04-21 DIAGNOSIS — N318 Other neuromuscular dysfunction of bladder: Secondary | ICD-10-CM

## 2013-04-21 DIAGNOSIS — G8929 Other chronic pain: Secondary | ICD-10-CM | POA: Diagnosis not present

## 2013-04-21 DIAGNOSIS — Z7901 Long term (current) use of anticoagulants: Secondary | ICD-10-CM | POA: Diagnosis not present

## 2013-04-21 DIAGNOSIS — R32 Unspecified urinary incontinence: Secondary | ICD-10-CM | POA: Insufficient documentation

## 2013-04-21 DIAGNOSIS — Z954 Presence of other heart-valve replacement: Secondary | ICD-10-CM | POA: Diagnosis not present

## 2013-04-21 DIAGNOSIS — M549 Dorsalgia, unspecified: Secondary | ICD-10-CM | POA: Diagnosis not present

## 2013-04-21 DIAGNOSIS — Z952 Presence of prosthetic heart valve: Secondary | ICD-10-CM

## 2013-04-21 DIAGNOSIS — N3281 Overactive bladder: Secondary | ICD-10-CM | POA: Insufficient documentation

## 2013-04-21 LAB — POCT INR: INR: 4.7

## 2013-04-21 MED ORDER — SOLIFENACIN SUCCINATE 5 MG PO TABS: 5.0000 mg | ORAL_TABLET | Freq: Every day | ORAL | Status: DC

## 2013-04-21 MED ORDER — OXYCODONE-ACETAMINOPHEN 10-325 MG PO TABS: 1.0000 | ORAL_TABLET | Freq: Four times a day (QID) | ORAL | Status: DC | PRN

## 2013-04-21 MED ORDER — WARFARIN SODIUM 2.5 MG PO TABS: 2.5000 mg | ORAL_TABLET | Freq: Every day | ORAL | Status: DC

## 2013-04-21 NOTE — Progress Notes (Signed)
CC: Janet Joyce is a 57 y.o. female is here for Coagulation Disorder and Medication Refill   Subjective: HPI:   Followup aortic valve replacement on chronic anticoagulation: Has been taking 3 mg Coumadin on daily basis denies changes to diet over-the-counter meds or supplements. Denies missed doses of Coumadin. Has noticed easy bruisability over the past week without blood in bowel movements nor bloody gums brushing teeth. Denies motor or sensory disturbances, chest pain, shortness of breath, nor limb claudication.   Followup chronic back pain: Denies change in character or severity of her back pain. Still gets good resolution of pain with current regimen of Percocet.  Tells me she's been seeing urology these past months for urinary incontinence. She's had a CT scan and cystoscopy with urodynamic studies and tells me she's been told everything looks normal. She still has daily urinary incontinence with sneezing or coughing but also at rest even when sleeping. She has never been on any medications for this.  Symptoms are moderate in severity nothing else makes better or worse than above. Denies dysuria, sensation of incomplete voiding, pelvic pain or flank pain   Review Of Systems Outlined In HPI  Past Medical History  Diagnosis Date  . Heart attack   . Hyperlipidemia   . Depression      Family History  Problem Relation Age of Onset  . Diabetes Father      History  Substance Use Topics  . Smoking status: Current Every Day Smoker -- 0.50 packs/day    Types: Cigarettes  . Smokeless tobacco: Not on file  . Alcohol Use: No     Objective: Filed Vitals:   04/21/13 1114  BP: 98/63  Pulse: 68    General: Alert and Oriented, No Acute Distress HEENT: Pupils equal, round, reactive to light. Conjunctivae clear.  Moist mucous membranes Lungs: Clear to auscultation bilaterally, no wheezing/ronchi/rales.  Comfortable work of breathing. Good air movement. Cardiac: Regular rate and  rhythm. Mechanical S2.  No murmurs, rubs, nor gallops.   Abdomen: soft nontender Extremities: No peripheral edema.  Strong peripheral pulses.  Mental Status: No depression, anxiety, nor agitation. Skin: Warm and dry.  Assessment & Plan: Emeli was seen today for coagulation disorder and medication refill.  Diagnoses and associated orders for this visit:  H/O aortic valve replacement - POCT INR - Cancel: Protime-INR - warfarin (COUMADIN) 2.5 MG tablet; Take 1 tablet (2.5 mg total) by mouth daily. - INR/PT  Chronic back pain - oxyCODONE-acetaminophen (PERCOCET) 10-325 MG per tablet; Take 1 tablet by mouth every 6 (six) hours as needed for pain.  Urinary incontinence - solifenacin (VESICARE) 5 MG tablet; Take 1 tablet (5 mg total) by mouth daily.  Overactive bladder    Aortic valve replacement chronic anticoagulation: INR 4.7 supratherapeutic therefore do not take tomorrow's dose of Coumadin and start on Sunday with 2.5 mg daily repeat on Friday this coming week. Chronic back pain: Controlled, continue Percocet regimen Overactive bladder with urinary incontinence: Uncontrolled, given samples 28 days given for trial  Return in about 1 week (around 04/28/2013).

## 2013-04-28 ENCOUNTER — Ambulatory Visit (INDEPENDENT_AMBULATORY_CARE_PROVIDER_SITE_OTHER): Payer: Medicare Other | Admitting: Family Medicine

## 2013-04-28 ENCOUNTER — Encounter: Payer: Self-pay | Admitting: Family Medicine

## 2013-04-28 VITALS — BP 111/68 | HR 73 | Wt 159.0 lb

## 2013-04-28 DIAGNOSIS — Z954 Presence of other heart-valve replacement: Secondary | ICD-10-CM | POA: Diagnosis not present

## 2013-04-28 DIAGNOSIS — Z7901 Long term (current) use of anticoagulants: Secondary | ICD-10-CM

## 2013-04-28 DIAGNOSIS — J309 Allergic rhinitis, unspecified: Secondary | ICD-10-CM | POA: Diagnosis not present

## 2013-04-28 DIAGNOSIS — Z952 Presence of prosthetic heart valve: Secondary | ICD-10-CM

## 2013-04-28 DIAGNOSIS — J302 Other seasonal allergic rhinitis: Secondary | ICD-10-CM

## 2013-04-28 LAB — POCT INR: INR: 2.3

## 2013-04-28 MED ORDER — MOMETASONE FUROATE 50 MCG/ACT NA SUSP: NASAL | Status: DC

## 2013-04-28 MED ORDER — WARFARIN SODIUM 3 MG PO TABS: 3.0000 mg | ORAL_TABLET | Freq: Every day | ORAL | Status: DC

## 2013-04-28 NOTE — Progress Notes (Signed)
CC: Janet Joyce is a 57 y.o. female is here for Coagulation Disorder   Subjective: HPI:  Follow Coumadin monitoring: Was supratherapeutic last visit, new regimen 2.5 mg Coumadin everyday without missed doses. Denies bleeding, bruising, nor motor or sensory disturbances.    Complains of nasal congestion ear congestion and sore throat. Present for one week. No interventions as of yet. Mild to moderate severity. Nothing particularly makes better or worse. Present all hours of the day. Denies fevers, chills, chest pain, shortness of breath, cough, wheezing   Review Of Systems Outlined In HPI  Past Medical History  Diagnosis Date  . Heart attack   . Hyperlipidemia   . Depression      Family History  Problem Relation Age of Onset  . Diabetes Father      History  Substance Use Topics  . Smoking status: Current Every Day Smoker -- 0.50 packs/day    Types: Cigarettes  . Smokeless tobacco: Not on file  . Alcohol Use: No     Objective: Filed Vitals:   04/28/13 1347  BP: 111/68  Pulse: 73    General: Alert and Oriented, No Acute Distress HEENT: Pupils equal, round, reactive to light. Conjunctivae clear.  External ears unremarkable, canals clear with intact TMs with appropriate landmarks.  Middle ear appears open without effusion. Pink inferior turbinates.  Moist mucous membranes, pharynx without inflammation nor lesions.  Neck supple without palpable lymphadenopathy nor abnormal masses. Lungs: Clear to auscultation bilaterally, no wheezing/ronchi/rales.  Comfortable work of breathing. Good air movement. Cardiac: Regular rate and rhythm. Mechanical S2.  No murmurs, rubs, nor gallops.   Extremities: No peripheral edema.  Strong peripheral pulses.  Mental Status: No depression, anxiety, nor agitation. Skin: Warm and dry.  Assessment & Plan: Janet Joyce was seen today for coagulation disorder.  Diagnoses and associated orders for this visit:  H/O aortic valve replacement - POCT  INR - warfarin (COUMADIN) 3 MG tablet; Take 1 tablet (3 mg total) by mouth daily.  Seasonal allergies - mometasone (NASONEX) 50 MCG/ACT nasal spray; Two sprays each nostril daily.    Aortic valve replacement on chronic Coumadin: Uncontrolled, INR 2.3 subtherapeutic, start new regimen of Coumadin Coumadin 2.5mg  Monday, Wednesday, Friday Coumadin 3.0mg  Saturday, Sunday, Tuesday, Thursday. Seasonal allergies: Start Nasonex, call if good response and would like formal prescription sample given today  Return 1 week for INR only   Return in about 1 week (around 05/05/2013).

## 2013-04-28 NOTE — Patient Instructions (Addendum)
Coumadin 2.5mg  Monday, Wednesday, Friday Coumadin 3.0mg  Saturday, Sunday, Tuesday, Thursday

## 2013-05-05 ENCOUNTER — Other Ambulatory Visit: Payer: Self-pay | Admitting: *Deleted

## 2013-05-05 MED ORDER — TRAMADOL HCL 50 MG PO TABS: 50.0000 mg | ORAL_TABLET | Freq: Three times a day (TID) | ORAL | Status: DC | PRN

## 2013-05-09 ENCOUNTER — Telehealth: Payer: Self-pay | Admitting: *Deleted

## 2013-05-09 ENCOUNTER — Ambulatory Visit (INDEPENDENT_AMBULATORY_CARE_PROVIDER_SITE_OTHER): Payer: Medicare Other | Admitting: Family Medicine

## 2013-05-09 VITALS — BP 125/68 | HR 66 | Wt 159.0 lb

## 2013-05-09 DIAGNOSIS — I4891 Unspecified atrial fibrillation: Secondary | ICD-10-CM

## 2013-05-09 LAB — POCT INR: INR: 2.2

## 2013-05-09 NOTE — Progress Notes (Signed)
  Subjective:    Patient ID: Janet Joyce, female    DOB: 1956/02/11, 57 y.o.   MRN: [id number] Patient's INR today is 2.2.  She states that she takes 2.5mg  & 3mg , alternating every other day. HPI    Review of Systems     Objective:   Physical Exam        Assessment & Plan:

## 2013-05-09 NOTE — Progress Notes (Signed)
Sue Lush, Will you please let Mrs. Castello know about the new coumadin regimen I've proposed.  Thank you.

## 2013-05-09 NOTE — Telephone Encounter (Signed)
Pt notified of coumadin instructions.

## 2013-05-12 ENCOUNTER — Telehealth: Payer: Self-pay | Admitting: Family Medicine

## 2013-05-12 DIAGNOSIS — E876 Hypokalemia: Secondary | ICD-10-CM

## 2013-05-12 MED ORDER — POTASSIUM CHLORIDE CRYS ER 20 MEQ PO TBCR: 40.0000 meq | EXTENDED_RELEASE_TABLET | Freq: Every day | ORAL | Status: DC

## 2013-05-12 NOTE — Telephone Encounter (Signed)
Potassium refill

## 2013-05-15 ENCOUNTER — Telehealth: Payer: Self-pay | Admitting: Family Medicine

## 2013-05-15 DIAGNOSIS — M545 Low back pain, unspecified: Secondary | ICD-10-CM

## 2013-05-15 NOTE — Telephone Encounter (Signed)
Janet Joyce, At you rconvienece will you please ask Janet Joyce if she truly is requesting a Lumbar Back Brace through SMS.  We often get fake requests like this targeting medicare patients for things they never actually requested.  If she did request this, I'd be happy to fill out the paperwork.

## 2013-05-16 ENCOUNTER — Ambulatory Visit (INDEPENDENT_AMBULATORY_CARE_PROVIDER_SITE_OTHER): Payer: Medicare Other | Admitting: Family Medicine

## 2013-05-16 VITALS — BP 112/64 | HR 62 | Wt 159.0 lb

## 2013-05-16 DIAGNOSIS — I4891 Unspecified atrial fibrillation: Secondary | ICD-10-CM

## 2013-05-16 DIAGNOSIS — M545 Low back pain, unspecified: Secondary | ICD-10-CM | POA: Insufficient documentation

## 2013-05-16 NOTE — Progress Notes (Signed)
  Subjective:    Patient ID: Janet Joyce, female    DOB: 1956-08-27, 57 y.o.   MRN: [id number]  HPI    Review of Systems     Objective:   Physical Exam        Assessment & Plan:  Pt notified of new Coumadin instructions per Dr. Ivan Anchors. Verbalized understanding. Barry Dienes, LPN

## 2013-05-16 NOTE — Telephone Encounter (Signed)
[facility] paperwork request completed, chronic back pain and Lumbago

## 2013-05-16 NOTE — Telephone Encounter (Signed)
Left message on vm

## 2013-05-16 NOTE — Progress Notes (Signed)
Janet Joyce, Will you please let [name] know the new regimen outlined in anticoag track.

## 2013-05-23 ENCOUNTER — Encounter: Payer: Self-pay | Admitting: Family Medicine

## 2013-05-23 ENCOUNTER — Ambulatory Visit (INDEPENDENT_AMBULATORY_CARE_PROVIDER_SITE_OTHER): Payer: Medicare Other | Admitting: Family Medicine

## 2013-05-23 VITALS — BP 106/68 | HR 66 | Wt 158.0 lb

## 2013-05-23 DIAGNOSIS — Z79899 Other long term (current) drug therapy: Secondary | ICD-10-CM | POA: Diagnosis not present

## 2013-05-23 DIAGNOSIS — M545 Low back pain, unspecified: Secondary | ICD-10-CM | POA: Diagnosis not present

## 2013-05-23 DIAGNOSIS — Z5181 Encounter for therapeutic drug level monitoring: Secondary | ICD-10-CM

## 2013-05-23 DIAGNOSIS — R0609 Other forms of dyspnea: Secondary | ICD-10-CM

## 2013-05-23 DIAGNOSIS — Z954 Presence of other heart-valve replacement: Secondary | ICD-10-CM

## 2013-05-23 DIAGNOSIS — R7309 Other abnormal glucose: Secondary | ICD-10-CM

## 2013-05-23 DIAGNOSIS — R0989 Other specified symptoms and signs involving the circulatory and respiratory systems: Secondary | ICD-10-CM

## 2013-05-23 DIAGNOSIS — Z952 Presence of prosthetic heart valve: Secondary | ICD-10-CM

## 2013-05-23 DIAGNOSIS — G8929 Other chronic pain: Secondary | ICD-10-CM

## 2013-05-23 DIAGNOSIS — E781 Pure hyperglyceridemia: Secondary | ICD-10-CM | POA: Diagnosis not present

## 2013-05-23 DIAGNOSIS — R0600 Dyspnea, unspecified: Secondary | ICD-10-CM

## 2013-05-23 DIAGNOSIS — R7303 Prediabetes: Secondary | ICD-10-CM

## 2013-05-23 LAB — POCT INR: INR: 5.6

## 2013-05-23 MED ORDER — WARFARIN SODIUM 2.5 MG PO TABS: 2.5000 mg | ORAL_TABLET | Freq: Every day | ORAL | Status: DC

## 2013-05-23 MED ORDER — OXYCODONE-ACETAMINOPHEN 10-325 MG PO TABS: 1.0000 | ORAL_TABLET | Freq: Four times a day (QID) | ORAL | Status: DC | PRN

## 2013-05-23 NOTE — Progress Notes (Signed)
CC: Janet Joyce is a 57 y.o. female is here for Coagulation Disorder   Subjective: HPI:  Followup hypertriglyceridemia: 3 months ago triglycerides were elevated with perfect LDL. She started TriCor has been taking it daily without side effects. She has cut out processed sugars switching for fruit no other dietary or exercise interventions. Denies chest pain, right upper quadrant pain, epigastric pain  Followup prediabetes: Interventions above no antihyperglycemic medication. She denies polyuria polyphagia polydipsia poorly healing wounds, motor or sensory disturbances. She's lost about 2-3 pounds of weight intentionally. Denies vision loss  Followup aortic valve replacement on chronic Coumadin therapy: Subtherapeutic last INR increased total weekly dose of Coumadin by 1 mg she's been taking 3 mg on a daily basis she reports some easy bruisability nothing abnormal subjectively. Denies blood in stool or bleeding gums nor bleeding elsewhere.   She complains of approximately one week of shortness of breath after falling asleep and lying down flat. This occurs one to 2 times every night she feels that dyspnea on exertion maybe getting more noticeable, now to the point where she is walking across a store. She denies cough, wheezing, chest pain, dizziness, lightheadedness.   Review Of Systems Outlined In HPI  Past Medical History  Diagnosis Date  . Heart attack   . Hyperlipidemia   . Depression      Family History  Problem Relation Age of Onset  . Diabetes Father      History  Substance Use Topics  . Smoking status: Current Every Day Smoker -- 0.50 packs/day    Types: Cigarettes  . Smokeless tobacco: Not on file  . Alcohol Use: No     Objective: Filed Vitals:   05/23/13 1310  BP: 106/68  Pulse: 66    General: Alert and Oriented, No Acute Distress HEENT: Pupils equal, round, reactive to light. Conjunctivae clear.   Moist mucous membranes, pharynx without inflammation nor  lesions.  Neck supple without palpable lymphadenopathy nor abnormal masses. Lungs: Clear to auscultation bilaterally, no wheezing/ronchi/rales.  Comfortable work of breathing. Good air movement. Cardiac: Regular rate and rhythm. Normal mechanical S2.  No murmurs, rubs, nor gallops.   Abdomen: Obese soft nontender Extremities: No peripheral edema.  Strong peripheral pulses.  Mental Status: No depression, anxiety, nor agitation. Skin: Warm and dry.  Assessment & Plan: Janet Joyce was seen today for coagulation disorder.  Diagnoses and associated orders for this visit:  H/O aortic valve replacement - INR/PT - warfarin (COUMADIN) 2.5 MG tablet; Take 1 tablet (2.5 mg total) by mouth daily. Take total 5mg  every Sunday - POCT INR  Hypertriglyceridemia - Lipid panel  Prediabetes - Hemoglobin A1c - warfarin (COUMADIN) 2.5 MG tablet; Take 1 tablet (2.5 mg total) by mouth daily. Take total 5mg  every Sunday  Chronic back pain - oxyCODONE-acetaminophen (PERCOCET) 10-325 MG per tablet; Take 1 tablet by mouth every 6 (six) hours as needed for pain.  Lumbago  Encounter for monitoring statin therapy - COMPLETE METABOLIC PANEL WITH GFR  PND (paroxysmal nocturnal dyspnea) - 2D Echocardiogram without contrast; Future    Hypertriglyceridemia: Due for triglyceride recheck and liver enzymes given Crestor use Prediabetes: Clinically controlled, due for A1c congratulated her fruit intervention Chronic back pain: Stable, due for monthly refill Percocet PND: She has an appointment with Dr. Leeann Joyce in 2 weeks we will order an echocardiogram for my review and for his review to look into congestive heart failure. Chronic anticoagulation: Supratherapeutic do not feel she needs vitamin K at this time, she reports success with a  regimen of Coumadin 5 mg Sunday 2.5 mg all other days which will be 1 mg less than her current weekly regimen, needs repeat one week  Return in about 1 week (around 05/30/2013) for INR  only check.

## 2013-05-24 LAB — LIPID PANEL
Cholesterol: 151 mg/dL (ref 0–200)
Total CHOL/HDL Ratio: 4.1 Ratio
Triglycerides: 205 mg/dL — ABNORMAL HIGH (ref <150)

## 2013-05-24 LAB — HEMOGLOBIN A1C: Hgb A1c MFr Bld: 5.7 % — ABNORMAL HIGH (ref <5.7)

## 2013-05-24 LAB — PROTIME-INR
INR: 4.69 — ABNORMAL HIGH (ref <1.50)
Prothrombin Time: 41.5 seconds — ABNORMAL HIGH (ref 11.6–15.2)

## 2013-05-24 LAB — COMPLETE METABOLIC PANEL WITH GFR
Albumin: 3.8 g/dL (ref 3.5–5.2)
BUN: 10 mg/dL (ref 6–23)
Calcium: 9.2 mg/dL (ref 8.4–10.5)
Chloride: 103 mEq/L (ref 96–112)
GFR, Est Non African American: 71 mL/min
Glucose, Bld: 95 mg/dL (ref 70–99)
Potassium: 4 mEq/L (ref 3.5–5.3)

## 2013-06-02 ENCOUNTER — Encounter: Payer: Self-pay | Admitting: Family Medicine

## 2013-06-02 ENCOUNTER — Ambulatory Visit (INDEPENDENT_AMBULATORY_CARE_PROVIDER_SITE_OTHER): Payer: Medicare Other | Admitting: Family Medicine

## 2013-06-02 VITALS — BP 120/72 | HR 59 | Wt 158.0 lb

## 2013-06-02 DIAGNOSIS — E781 Pure hyperglyceridemia: Secondary | ICD-10-CM

## 2013-06-02 DIAGNOSIS — I4891 Unspecified atrial fibrillation: Secondary | ICD-10-CM | POA: Diagnosis not present

## 2013-06-02 DIAGNOSIS — R7309 Other abnormal glucose: Secondary | ICD-10-CM

## 2013-06-02 DIAGNOSIS — Z952 Presence of prosthetic heart valve: Secondary | ICD-10-CM

## 2013-06-02 DIAGNOSIS — I951 Orthostatic hypotension: Secondary | ICD-10-CM | POA: Diagnosis not present

## 2013-06-02 DIAGNOSIS — R7303 Prediabetes: Secondary | ICD-10-CM

## 2013-06-02 NOTE — Progress Notes (Signed)
CC: Janet Joyce is a 57 y.o. female is here for discuss lab results and Coagulation Disorder   Subjective: HPI:  Followup anticoagulation: The last visit she was supratherapeutic, she's been taking Coumadin 2.5 mg daily and 5 mg on Sunday. She denies bleeding, bruising, nor motor or sensory disturbances. Denies shortness of breath or chest pain. She denies change in diet with respect to vitamin K intake  She has questions regarding labs obtained at last visit. Specifically A1c which is improving but still in a prediabetic range, hypertriglyceridemia, and what the values of metabolic panel are significant  She complains of lightheadedness upon standing that has been present for the last week. It is mild in severity it lasts for 2-3 seconds it is improved greatly with no intervention. This only occurs when she stands up quickly. It can occur anytime of the day it has not been getting better or worse since onset. She denies headache, concentration difficulty, coordination difficulty nor memory loss   Review Of Systems Outlined In HPI  Past Medical History  Diagnosis Date  . Heart attack   . Hyperlipidemia   . Depression      Family History  Problem Relation Age of Onset  . Diabetes Father      History  Substance Use Topics  . Smoking status: Current Every Day Smoker -- 0.50 packs/day    Types: Cigarettes  . Smokeless tobacco: Not on file  . Alcohol Use: No     Objective: Filed Vitals:   06/02/13 1323  BP: 120/72  Pulse: 59    General: Alert and Oriented, No Acute Distress HEENT: Pupils equal, round, reactive to light. Conjunctivae clear.  Moist mucous membranes pharynx unremarkable Lungs: Clear to auscultation bilaterally, no wheezing/ronchi/rales.  Comfortable work of breathing. Good air movement. Cardiac: Regular rate and rhythm. Normal S1 mechanical S2.  No murmurs, rubs, nor gallops.   Extremities: No peripheral edema.  Strong peripheral pulses.  Mental Status: No  depression, anxiety, nor agitation. Skin: Warm and dry.  Assessment & Plan: Janet Joyce was seen today for discuss lab results and coagulation disorder.  Diagnoses and associated orders for this visit:  H/O aortic valve replacement - POCT INR - INR/PT - warfarin (COUMADIN) 2.5 MG tablet; Take 1 tablet (2.5 mg total) by mouth daily.  Atrial fibrillation - INR/PT  Orthostatic hypotension  Prediabetes - warfarin (COUMADIN) 2.5 MG tablet; Take 1 tablet (2.5 mg total) by mouth daily.  Hypertriglyceridemia    Anticoagulation for aortic valve: Supratherapeutic no indication for vitamin K currently, decreased daily Coumadin dose to 2.5 mg on a daily basis. Orthostatic hypotension: Discussed slowly getting up in the future and may add a mild amount of salt in her diet. We cannot stop her beta blocker or Imdur because any decrease of this will cause return of chest pain Time was spent addressing her labs and answering all of her questions providing feedback for the following Prediabetes: Controlled chronic condition, continue diet and exercise interventions Hypertriglyceridemia: Uncontrolled chronic condition, she would like to work on diet and exercise interventions instead of medications  Return in about 1 week (around 06/09/2013).

## 2013-06-06 DIAGNOSIS — J449 Chronic obstructive pulmonary disease, unspecified: Secondary | ICD-10-CM | POA: Diagnosis not present

## 2013-06-06 DIAGNOSIS — I2510 Atherosclerotic heart disease of native coronary artery without angina pectoris: Secondary | ICD-10-CM | POA: Diagnosis not present

## 2013-06-06 DIAGNOSIS — Z954 Presence of other heart-valve replacement: Secondary | ICD-10-CM | POA: Diagnosis not present

## 2013-06-06 DIAGNOSIS — J4489 Other specified chronic obstructive pulmonary disease: Secondary | ICD-10-CM | POA: Diagnosis not present

## 2013-06-06 DIAGNOSIS — Z9861 Coronary angioplasty status: Secondary | ICD-10-CM | POA: Diagnosis not present

## 2013-06-06 DIAGNOSIS — Z951 Presence of aortocoronary bypass graft: Secondary | ICD-10-CM | POA: Diagnosis not present

## 2013-06-06 DIAGNOSIS — R079 Chest pain, unspecified: Secondary | ICD-10-CM | POA: Diagnosis not present

## 2013-06-09 ENCOUNTER — Ambulatory Visit (INDEPENDENT_AMBULATORY_CARE_PROVIDER_SITE_OTHER): Payer: Medicare Other | Admitting: Family Medicine

## 2013-06-09 ENCOUNTER — Encounter: Payer: Self-pay | Admitting: *Deleted

## 2013-06-09 VITALS — BP 111/72 | HR 64

## 2013-06-09 DIAGNOSIS — Z954 Presence of other heart-valve replacement: Secondary | ICD-10-CM | POA: Diagnosis not present

## 2013-06-09 DIAGNOSIS — I4891 Unspecified atrial fibrillation: Secondary | ICD-10-CM | POA: Diagnosis not present

## 2013-06-09 DIAGNOSIS — Z952 Presence of prosthetic heart valve: Secondary | ICD-10-CM

## 2013-06-09 MED ORDER — WARFARIN SODIUM 2 MG PO TABS: 2.0000 mg | ORAL_TABLET | Freq: Every day | ORAL | Status: DC

## 2013-06-09 NOTE — Progress Notes (Signed)
Selena Batten has left a detailed message on VM stating holding coumadin for two days then start new reigimen of 2mg  daily repeat ~1 week.

## 2013-06-13 ENCOUNTER — Telehealth: Payer: Self-pay | Admitting: Family Medicine

## 2013-06-13 MED ORDER — CHLORDIAZEPOXIDE-AMITRIPTYLINE 5-12.5 MG PO TABS: ORAL_TABLET | ORAL | Status: DC

## 2013-06-13 NOTE — Telephone Encounter (Signed)
Pt request

## 2013-06-19 ENCOUNTER — Ambulatory Visit (INDEPENDENT_AMBULATORY_CARE_PROVIDER_SITE_OTHER): Payer: Medicare Other | Admitting: Sports Medicine

## 2013-06-19 VITALS — BP 118/78 | HR 61 | Wt 157.0 lb

## 2013-06-19 DIAGNOSIS — I4891 Unspecified atrial fibrillation: Secondary | ICD-10-CM | POA: Diagnosis not present

## 2013-06-19 LAB — POCT INR: INR: 4.7

## 2013-06-19 NOTE — Progress Notes (Signed)
I was present for all essential parts of this visit and procedure.  Thomas J. Thekkekandam, M.D.   

## 2013-06-19 NOTE — Progress Notes (Signed)
No missed doses, or changes in diet. No bleeding or bruising.Janet Joyce [place]

## 2013-06-22 ENCOUNTER — Other Ambulatory Visit: Payer: Self-pay | Admitting: *Deleted

## 2013-06-22 DIAGNOSIS — M549 Dorsalgia, unspecified: Secondary | ICD-10-CM

## 2013-06-22 DIAGNOSIS — G8929 Other chronic pain: Secondary | ICD-10-CM

## 2013-06-22 MED ORDER — OXYCODONE-ACETAMINOPHEN 10-325 MG PO TABS: 1.0000 | ORAL_TABLET | Freq: Four times a day (QID) | ORAL | Status: DC | PRN

## 2013-06-27 ENCOUNTER — Ambulatory Visit: Payer: Medicare Other

## 2013-06-27 ENCOUNTER — Ambulatory Visit (INDEPENDENT_AMBULATORY_CARE_PROVIDER_SITE_OTHER): Payer: Medicare Other | Admitting: Family Medicine

## 2013-06-27 DIAGNOSIS — I4891 Unspecified atrial fibrillation: Secondary | ICD-10-CM

## 2013-06-27 LAB — POCT INR: INR: 2.9

## 2013-06-27 NOTE — Progress Notes (Signed)
Pt.notified

## 2013-06-27 NOTE — Progress Notes (Signed)
Sue Lush, Will you please relay she's therapeutic, f/u in 1 week and if again therapeutic can f/u for INRs monthly.

## 2013-06-29 ENCOUNTER — Telehealth: Payer: Self-pay | Admitting: *Deleted

## 2013-06-29 NOTE — Telephone Encounter (Signed)
Pt called and states she has been dizzy for 3 weeks every time she stands she feels tingly in her head. Pt states when she gets up she feel " like she wants to walk to the right." I asked pt if she has weakness on the right side and pt states she does. Pt did schedule appt for tomorrow but I advised her to go to the ER.Pt voiced understanding

## 2013-06-30 ENCOUNTER — Ambulatory Visit: Payer: Medicare Other | Admitting: Family Medicine

## 2013-06-30 DIAGNOSIS — Z0289 Encounter for other administrative examinations: Secondary | ICD-10-CM

## 2013-07-04 ENCOUNTER — Encounter: Payer: Self-pay | Admitting: Family Medicine

## 2013-07-04 ENCOUNTER — Ambulatory Visit (INDEPENDENT_AMBULATORY_CARE_PROVIDER_SITE_OTHER): Payer: Medicare Other | Admitting: Family Medicine

## 2013-07-04 VITALS — BP 123/71 | HR 62 | Wt 152.0 lb

## 2013-07-04 DIAGNOSIS — H547 Unspecified visual loss: Secondary | ICD-10-CM | POA: Diagnosis not present

## 2013-07-04 DIAGNOSIS — H60399 Other infective otitis externa, unspecified ear: Secondary | ICD-10-CM

## 2013-07-04 DIAGNOSIS — Z952 Presence of prosthetic heart valve: Secondary | ICD-10-CM

## 2013-07-04 DIAGNOSIS — I2510 Atherosclerotic heart disease of native coronary artery without angina pectoris: Secondary | ICD-10-CM | POA: Diagnosis not present

## 2013-07-04 DIAGNOSIS — H6091 Unspecified otitis externa, right ear: Secondary | ICD-10-CM

## 2013-07-04 DIAGNOSIS — Z954 Presence of other heart-valve replacement: Secondary | ICD-10-CM | POA: Diagnosis not present

## 2013-07-04 DIAGNOSIS — I951 Orthostatic hypotension: Secondary | ICD-10-CM | POA: Diagnosis not present

## 2013-07-04 MED ORDER — WARFARIN SODIUM 2 MG PO TABS: 2.0000 mg | ORAL_TABLET | Freq: Every day | ORAL | Status: DC

## 2013-07-04 MED ORDER — CARVEDILOL 3.125 MG PO TABS: 3.1250 mg | ORAL_TABLET | Freq: Two times a day (BID) | ORAL | Status: DC

## 2013-07-04 MED ORDER — NEOMYCIN-POLYMYXIN-HC 3.5-10000-1 OT SOLN: 3.0000 [drp] | Freq: Three times a day (TID) | OTIC | Status: AC

## 2013-07-04 NOTE — Progress Notes (Signed)
CC: Janet Joyce is a 57 y.o. female is here for Dizziness and Coagulation Disorder   Subjective: HPI:  Followup anticoagulation for aortic valve replacement. She's been taking Coumadin 2 mg daily without missed doses no dietary changes recently with respect to vitamin K consumption. She denies chest pain, shortness of breath, motor or sensory disturbances(other than that described below), bleeding or easy bruisability. She denies orthopnea nor peripheral edema.  Patient expresses frustration with dizziness this has been present off and on for the past 5 months. Over the past 2 weeks she's noticed worsening severity now moderate in severity present most days of the week. It is reproducible and only occurs when changing from a sitting to standing position it lasts about 2 minutes but will resolve within seconds after sitting. She has good days and bad days without anything else making better or worse. She describes is only has dizziness and lightheadedness without accompanying vision loss, hearing loss, motor sensory disturbances, no coordination difficulty.  At her last visit she had similar symptoms I've encouraged her to increase salt in her diet she admits she has had trouble doing this.    Patient does believe that her vision loss overall has been declining over the past 6 months it has been gradual it is described as mild in severity is slightly improved when wearing glasses, it seems to be most bothersome with distant vision equal in both eyes without double vision, ocular pain, facial pain, eye discharge.   Review Of Systems Outlined In HPI  Past Medical History  Diagnosis Date  . Heart attack   . Hyperlipidemia   . Depression      Family History  Problem Relation Age of Onset  . Diabetes Father      History  Substance Use Topics  . Smoking status: Current Every Day Smoker -- 0.50 packs/day    Types: Cigarettes  . Smokeless tobacco: Not on file  . Alcohol Use: No      Objective: Filed Vitals:   07/04/13 1320  BP: 123/71  Pulse: 62    General: Alert and Oriented, No Acute Distress HEENT: Pupils equal, round, reactive to light. Conjunctivae clear.  External ears unremarkable, right canal with moderate edema and mild erythema left canal unremarkable.  Middle ear appears open without effusion. Pink inferior turbinates.  Moist mucous membranes, pharynx without inflammation nor lesions.  Neck supple without palpable lymphadenopathy nor abnormal masses. Lungs: Clear to auscultation bilaterally, no wheezing/ronchi/rales.  Comfortable work of breathing. Good air movement. Cardiac: Regular rate and rhythm. Normal S1 mechanical S2.  No murmurs, rubs, nor gallops.   Extremities: No peripheral edema.  Strong peripheral pulses.  Mental Status: No depression, anxiety, nor agitation. Skin: Warm and dry.  Assessment & Plan: Janet Joyce was seen today for dizziness and coagulation disorder.  Diagnoses and associated orders for this visit:  H/O aortic valve replacement - POCT INR  CAD (coronary artery disease) - carvedilol (COREG) 3.125 MG tablet; Take 1 tablet (3.125 mg total) by mouth 2 (two) times daily.  Aortic valve replaced - warfarin (COUMADIN) 2 MG tablet; Take 1 tablet (2 mg total) by mouth daily.  Otitis externa, right - neomycin-polymyxin-hydrocortisone (CORTISPORIN) otic solution; Place 3 drops into the right ear 3 (three) times daily. Keep in for 5 minutes.  Total of 10 days.  Orthostatic hypotension  Vision loss    Aortic valve replacement Coumadin management: INR 2.6 therapeutic second consecutive therapeutic therefore continue 2 mg daily return 4 weeks. Coronary artery disease: Stable however  I believe she is having orthostatic hypotension, unfortunately she never got word of recommendations months ago about cutting Coreg in half she is okay with the idea of taking only 3 mg twice a day, she'll return if symptoms persist despite change or chest  pain returns. Vision loss: I've encouraged her to check with her optometrist it appears she is due for a prescription change Right otitis externa start Cortisporin  Return in about 4 weeks (around 08/01/2013).

## 2013-07-21 ENCOUNTER — Telehealth: Payer: Self-pay | Admitting: *Deleted

## 2013-07-21 DIAGNOSIS — M549 Dorsalgia, unspecified: Secondary | ICD-10-CM

## 2013-07-21 DIAGNOSIS — G8929 Other chronic pain: Secondary | ICD-10-CM

## 2013-07-21 MED ORDER — OXYCODONE-ACETAMINOPHEN 10-325 MG PO TABS: 1.0000 | ORAL_TABLET | Freq: Four times a day (QID) | ORAL | Status: DC | PRN

## 2013-07-21 NOTE — Telephone Encounter (Signed)
Pt calls and request a refill on her pain meds and can pick up on Monday. Barry Dienes, LPN

## 2013-07-21 NOTE — Telephone Encounter (Signed)
rx placed up front

## 2013-07-21 NOTE — Telephone Encounter (Signed)
Janet Joyce, Rx placed in in-box ready for pickup/faxing.  

## 2013-08-04 ENCOUNTER — Encounter: Payer: Self-pay | Admitting: Family Medicine

## 2013-08-04 ENCOUNTER — Ambulatory Visit (INDEPENDENT_AMBULATORY_CARE_PROVIDER_SITE_OTHER): Payer: Medicare Other | Admitting: Family Medicine

## 2013-08-04 VITALS — BP 115/73 | HR 71 | Wt 155.0 lb

## 2013-08-04 DIAGNOSIS — Z7901 Long term (current) use of anticoagulants: Secondary | ICD-10-CM | POA: Diagnosis not present

## 2013-08-04 DIAGNOSIS — R05 Cough: Secondary | ICD-10-CM | POA: Diagnosis not present

## 2013-08-04 DIAGNOSIS — Z954 Presence of other heart-valve replacement: Secondary | ICD-10-CM | POA: Diagnosis not present

## 2013-08-04 DIAGNOSIS — I10 Essential (primary) hypertension: Secondary | ICD-10-CM | POA: Diagnosis not present

## 2013-08-04 DIAGNOSIS — R059 Cough, unspecified: Secondary | ICD-10-CM | POA: Diagnosis not present

## 2013-08-04 DIAGNOSIS — Z952 Presence of prosthetic heart valve: Secondary | ICD-10-CM

## 2013-08-04 MED ORDER — SULFAMETHOXAZOLE-TRIMETHOPRIM 800-160 MG PO TABS: ORAL_TABLET | ORAL | Status: AC

## 2013-08-04 MED ORDER — GUAIFENESIN-CODEINE 100-10 MG/5ML PO SYRP: 10.0000 mL | ORAL_SOLUTION | Freq: Every evening | ORAL | Status: DC | PRN

## 2013-08-04 MED ORDER — WARFARIN SODIUM 2 MG PO TABS: 2.0000 mg | ORAL_TABLET | Freq: Every day | ORAL | Status: DC

## 2013-08-04 NOTE — Progress Notes (Signed)
CC: Janet Joyce is a 57 y.o. female is here for Hypertension and Coagulation Disorder   Subjective: HPI:  Followup coagulation disorder: History aortic valve replacement: Taking Coumadin 2 mg a daily basis no missed doses. She was therapeutic about a month ago. She denies bruising or bleeding. Denies any motor sensory disturbances chest pain or irregular heartbeat.  Follow essential hypertension: At last visit she was experiencing orthostatic hypotension symptoms. This has improved since decreasing her carvedilol. No outside blood pressures to report. Denies peripheral edema, orthopnea nor PND.   Patient complains of cough has been present for 2 weeks seems to be worsening a weekly basis to moderate severity is described as productive brown sputum without blood. Feels like chest congestion but denies chest pain per se. Shortness of breath with exertion which is atypical for her. She felt somewhat feverish yesterday.  Review Of Systems Outlined In HPI  Past Medical History  Diagnosis Date  . Heart attack   . Hyperlipidemia   . Depression      Family History  Problem Relation Age of Onset  . Diabetes Father      History  Substance Use Topics  . Smoking status: Current Every Day Smoker -- 0.50 packs/day    Types: Cigarettes  . Smokeless tobacco: Not on file  . Alcohol Use: No     Objective: Filed Vitals:   08/04/13 1316  BP: 115/73  Pulse: 71    General: Alert and Oriented, No Acute Distress HEENT: Pupils equal, round, reactive to light. Conjunctivae clear.  Moist mucous membranes Lungs: comfortable work of breathing, moderate ronchi in all lung fields without wheezing nor rales nor signs of consolidation Cardiac: Regular rate and rhythm. Mechanical S2.  No murmurs, rubs, nor gallops.   Extremities: No peripheral edema.  Strong peripheral pulses.  Mental Status: No depression, anxiety, nor agitation. Skin: Warm and dry.  Assessment & Plan: Diani was seen today for  hypertension and coagulation disorder.  Diagnoses and associated orders for this visit:  H/O aortic valve replacement - POCT INR  Cough - guaiFENesin-codeine (ROBITUSSIN AC) 100-10 MG/5ML syrup; Take 10 mLs by mouth at bedtime as needed for cough. - sulfamethoxazole-trimethoprim (SEPTRA DS) 800-160 MG per tablet; One by mouth twice a day for ten days.  Aortic valve replaced - warfarin (COUMADIN) 2 MG tablet; Take 1 tablet (2 mg total) by mouth daily.  Essential hypertension, benign    history of aortic valve replacement: Supratherapeutic INR she will cut down to 1 mg Coumadin Mondays and Fridays 2 mg all other days. Return for INR in one week Essential hypertension: Controlled still normotensive despite decrease in carvedilol, we'll stick with 3 mg twice a day and less orthostatic hypotension returns Cough: Suspect acute bronchitis start Bactrim, cough syrup above as needed  Return in about 1 week (around 08/11/2013).

## 2013-08-07 ENCOUNTER — Telehealth: Payer: Self-pay

## 2013-08-07 ENCOUNTER — Telehealth: Payer: Self-pay | Admitting: *Deleted

## 2013-08-07 NOTE — Telephone Encounter (Signed)
Pharmacist notified of instructions. Barry Dienes, LPN

## 2013-08-07 NOTE — Telephone Encounter (Signed)
Error

## 2013-08-07 NOTE — Telephone Encounter (Signed)
Sue Lush, Will you please let the pharmacy know that they can go ahead and fill the bactrim, please ask sheelah to only take 1mg  of coumadin while on bactrim, then go back to 1mg  on Monday and Friday with full 2mg  all other days.

## 2013-08-07 NOTE — Telephone Encounter (Signed)
Pharmacist Dan calls and states there is a major interaction with the Bactrim and warfarin that pt is on and just wanted to make sure you were aware of this and if still want it filled. Barry Dienes, LPN

## 2013-08-11 ENCOUNTER — Ambulatory Visit: Payer: Medicare Other | Admitting: Family Medicine

## 2013-08-15 ENCOUNTER — Ambulatory Visit: Payer: Medicare Other | Admitting: Family Medicine

## 2013-08-15 VITALS — BP 126/79 | HR 67

## 2013-08-15 DIAGNOSIS — Z954 Presence of other heart-valve replacement: Secondary | ICD-10-CM | POA: Diagnosis not present

## 2013-08-15 DIAGNOSIS — I4891 Unspecified atrial fibrillation: Secondary | ICD-10-CM

## 2013-08-15 DIAGNOSIS — Z952 Presence of prosthetic heart valve: Secondary | ICD-10-CM

## 2013-08-15 LAB — POCT INR: INR: 6.1

## 2013-08-15 NOTE — Progress Notes (Signed)
  Subjective:    Patient ID: Janet Joyce, female    DOB: 06-14-1956, 57 y.o.   MRN: [id number]  HPI   Pt is here for INR Review of Systems     Objective:   Physical Exam        Assessment & Plan:  INR 6.1 today. Instructed to go to lab downstairs to confirm and we will call her later today

## 2013-08-16 ENCOUNTER — Telehealth: Payer: Self-pay | Admitting: Family Medicine

## 2013-08-16 ENCOUNTER — Ambulatory Visit: Payer: Self-pay | Admitting: Family Medicine

## 2013-08-16 ENCOUNTER — Ambulatory Visit: Payer: Medicare Other | Admitting: Family Medicine

## 2013-08-16 LAB — PROTIME-INR: INR: 4.77 — ABNORMAL HIGH (ref <1.50)

## 2013-08-16 MED ORDER — TRAMADOL HCL 50 MG PO TABS: 50.0000 mg | ORAL_TABLET | Freq: Three times a day (TID) | ORAL | Status: DC | PRN

## 2013-08-16 NOTE — Telephone Encounter (Signed)
Pt request

## 2013-08-16 NOTE — Progress Notes (Signed)
Pt notified and advised to f/u in 1 week with hommel

## 2013-08-23 ENCOUNTER — Encounter: Payer: Self-pay | Admitting: Family Medicine

## 2013-08-23 ENCOUNTER — Ambulatory Visit (INDEPENDENT_AMBULATORY_CARE_PROVIDER_SITE_OTHER): Payer: Medicare Other | Admitting: Family Medicine

## 2013-08-23 VITALS — BP 117/72 | HR 72 | Wt 154.0 lb

## 2013-08-23 DIAGNOSIS — M549 Dorsalgia, unspecified: Secondary | ICD-10-CM

## 2013-08-23 DIAGNOSIS — I4891 Unspecified atrial fibrillation: Secondary | ICD-10-CM | POA: Diagnosis not present

## 2013-08-23 DIAGNOSIS — G8929 Other chronic pain: Secondary | ICD-10-CM

## 2013-08-23 DIAGNOSIS — R05 Cough: Secondary | ICD-10-CM | POA: Diagnosis not present

## 2013-08-23 DIAGNOSIS — R059 Cough, unspecified: Secondary | ICD-10-CM

## 2013-08-23 DIAGNOSIS — Z952 Presence of prosthetic heart valve: Secondary | ICD-10-CM

## 2013-08-23 DIAGNOSIS — Z954 Presence of other heart-valve replacement: Secondary | ICD-10-CM | POA: Diagnosis not present

## 2013-08-23 MED ORDER — OXYCODONE-ACETAMINOPHEN 10-325 MG PO TABS: 1.0000 | ORAL_TABLET | Freq: Four times a day (QID) | ORAL | Status: DC | PRN

## 2013-08-23 NOTE — Progress Notes (Signed)
CC: Janet Joyce is a 57 y.o. female is here for Coagulation Disorder   Subjective: HPI:   Followup anticoagulation: Last week INR was 4.1. Decreased dose to half of a 2 mg tab on Tuesday, Thursday and Saturday. A 2 mg tablet all other days. She denies change in diet, bleeding, bruising, nor motor or sensory disturbances. Denies chest pain shortness of breath  Patient continues to complain of cough that has been present since 2 weeks ago symptoms are now mild from moderate after Bactrim for 10 days. Cough is nonproductive present most hours of the day nothing particularly makes worse, Hycodan helped significantly at bedtime. She denies wheezing, chest pain, dyspnea, orthopnea fevers or chills  Patient is requesting refill of oxycodone. States chronic back pain is still same frequency character and severity, currently moderate in severity absent soon after taking oxycodone. Denies any new joint or muscle discomfort. Pain is currently localized in the midline of the thoracic or lumbar region. Improves with physical activity.    Review Of Systems Outlined In HPI  Past Medical History  Diagnosis Date  . Heart attack   . Hyperlipidemia   . Depression      Family History  Problem Relation Age of Onset  . Diabetes Father      History  Substance Use Topics  . Smoking status: Current Every Day Smoker -- 0.50 packs/day    Types: Cigarettes  . Smokeless tobacco: Not on file  . Alcohol Use: No     Objective: Filed Vitals:   08/23/13 1310  BP: 117/72  Pulse: 72    General: Alert and Oriented, No Acute Distress HEENT: Pupils equal, round, reactive to light. Conjunctivae clear.  Moist mucous membranes pharynx unremarkable Lungs: Clear to auscultation bilaterally, no wheezing/ronchi/rales.  Comfortable work of breathing. Good air movement. Cardiac: Regular rate and rhythm. Normal S1/S2.  No murmurs, rubs, nor gallops.   Back: No reproducible paraspinal muscular pain with palpation  nor midline spinous process tenderness in his thoracic lumbar region Extremities: No peripheral edema.  Strong peripheral pulses.  Mental Status: No depression, anxiety, nor agitation. Skin: Warm and dry.  Assessment & Plan: Janet Joyce was seen today for coagulation disorder.  Diagnoses and associated orders for this visit:  Atrial fibrillation - INR/PT - POCT INR  H/O aortic valve replacement - INR/PT - POCT INR  Chronic back pain - oxyCODONE-acetaminophen (PERCOCET) 10-325 MG per tablet; Take 1 tablet by mouth every 6 (six) hours as needed for pain.  Cough    History atrial fibrillation and aortic valve replacement: INR is supratherapeutic at 4.5 on fingerstick checking with venipuncture prior to adjusting Coumadin. Chronic back pain: Controlled continue oxycodone as needed  Cough: Provided reassurance exam today may take up to 2-3 weeks for cough to completely reside if worsening or persistence of next week we'll consider antibiotic/steroid  Return in about 1 week (around 08/30/2013).

## 2013-08-24 ENCOUNTER — Telehealth: Payer: Self-pay | Admitting: Family Medicine

## 2013-08-24 DIAGNOSIS — Z952 Presence of prosthetic heart valve: Secondary | ICD-10-CM

## 2013-08-24 NOTE — Telephone Encounter (Signed)
Andrea/Coverage, Will you please let Janet Joyce know that her INR was 3.7 with a goal between 2.5-3.5.  I'd encourage a small adjustment to her coumadin regimen. Now encouraged to use her 2mg  tablets as follows: Full tablet on M,W,F. Half tablet Tu,Th,Sat,Sun Recheck INR mid to late next week.

## 2013-08-24 NOTE — Telephone Encounter (Signed)
Pt informed.  Yaresly Menzel, LPN  

## 2013-08-31 DIAGNOSIS — F322 Major depressive disorder, single episode, severe without psychotic features: Secondary | ICD-10-CM | POA: Diagnosis not present

## 2013-09-01 ENCOUNTER — Encounter: Payer: Self-pay | Admitting: *Deleted

## 2013-09-01 ENCOUNTER — Ambulatory Visit (INDEPENDENT_AMBULATORY_CARE_PROVIDER_SITE_OTHER): Payer: Medicare Other | Admitting: Family Medicine

## 2013-09-01 VITALS — BP 127/74 | HR 68 | Wt 154.0 lb

## 2013-09-01 DIAGNOSIS — Z23 Encounter for immunization: Secondary | ICD-10-CM | POA: Diagnosis not present

## 2013-09-01 DIAGNOSIS — I4891 Unspecified atrial fibrillation: Secondary | ICD-10-CM | POA: Diagnosis not present

## 2013-09-01 LAB — POCT INR: INR: 2.1

## 2013-09-01 NOTE — Patient Instructions (Signed)
INR now 2.1 compared to 4.1 last week.  No change today, continue half of a 2 mg tab on Tuesday, Thursday and Saturday. A 2 mg tablet all other days. Repeat in one week to see where INR has stabilized.   

## 2013-09-01 NOTE — Progress Notes (Signed)
Pt.notified

## 2013-09-01 NOTE — Progress Notes (Signed)
INR now 2.1 compared to 4.1 last week.  No change today, continue half of a 2 mg tab on Tuesday, Thursday and Saturday. A 2 mg tablet all other days. Repeat in one week to see where INR has stabilized.

## 2013-09-08 ENCOUNTER — Encounter: Payer: Self-pay | Admitting: *Deleted

## 2013-09-08 ENCOUNTER — Ambulatory Visit (INDEPENDENT_AMBULATORY_CARE_PROVIDER_SITE_OTHER): Payer: Medicare Other | Admitting: Family Medicine

## 2013-09-08 VITALS — BP 112/70 | HR 77

## 2013-09-08 DIAGNOSIS — I4891 Unspecified atrial fibrillation: Secondary | ICD-10-CM | POA: Diagnosis not present

## 2013-09-08 NOTE — Progress Notes (Signed)
Andrea, Will you please let Lynsey know that the viral culture shows that her rash is from the herpes virus, I'd encourage her to continue on acyclovir, she can stop taking the sulfamethoxazole-trimethoprim but do not throw it away in case we need it in the near future.  

## 2013-09-08 NOTE — Progress Notes (Signed)
Pt notified. Told her that if her INR is at where it should be next week then her nurse appt can be space to every 3-4 weeks per Dr. Ivan Anchors. Pt voiced understanding

## 2013-09-15 ENCOUNTER — Ambulatory Visit (INDEPENDENT_AMBULATORY_CARE_PROVIDER_SITE_OTHER): Payer: Medicare Other | Admitting: Family Medicine

## 2013-09-15 DIAGNOSIS — I4891 Unspecified atrial fibrillation: Secondary | ICD-10-CM | POA: Diagnosis not present

## 2013-09-15 NOTE — Progress Notes (Signed)
Pt notified and voiced understanding 

## 2013-09-15 NOTE — Progress Notes (Signed)
Sue Lush, Will you please let mrs. Devers know: INR now 1.5 compared to 2.9 last week.  Increase Saturday's dose to 2mg . Now half of a 2 mg tab on Tuesday, Thursday. A 2 mg tablet all other days. Repeat in one week to see where INR has stabilized.

## 2013-09-22 ENCOUNTER — Encounter: Payer: Self-pay | Admitting: Family Medicine

## 2013-09-22 ENCOUNTER — Ambulatory Visit (INDEPENDENT_AMBULATORY_CARE_PROVIDER_SITE_OTHER): Payer: Medicare Other | Admitting: Family Medicine

## 2013-09-22 VITALS — BP 106/74 | HR 73 | Wt 148.0 lb

## 2013-09-22 DIAGNOSIS — M549 Dorsalgia, unspecified: Secondary | ICD-10-CM | POA: Diagnosis not present

## 2013-09-22 DIAGNOSIS — R059 Cough, unspecified: Secondary | ICD-10-CM

## 2013-09-22 DIAGNOSIS — Z954 Presence of other heart-valve replacement: Secondary | ICD-10-CM

## 2013-09-22 DIAGNOSIS — R05 Cough: Secondary | ICD-10-CM

## 2013-09-22 DIAGNOSIS — B379 Candidiasis, unspecified: Secondary | ICD-10-CM

## 2013-09-22 DIAGNOSIS — I4891 Unspecified atrial fibrillation: Secondary | ICD-10-CM

## 2013-09-22 DIAGNOSIS — Z952 Presence of prosthetic heart valve: Secondary | ICD-10-CM

## 2013-09-22 DIAGNOSIS — G8929 Other chronic pain: Secondary | ICD-10-CM

## 2013-09-22 MED ORDER — OXYCODONE-ACETAMINOPHEN 10-325 MG PO TABS: 1.0000 | ORAL_TABLET | Freq: Four times a day (QID) | ORAL | Status: DC | PRN

## 2013-09-22 MED ORDER — CLOTRIMAZOLE 1 % EX CREA: TOPICAL_CREAM | CUTANEOUS | Status: DC

## 2013-09-22 MED ORDER — PREDNISONE 20 MG PO TABS: ORAL_TABLET | ORAL | Status: AC

## 2013-09-22 MED ORDER — CLOTRIMAZOLE 1 % EX CREA: TOPICAL_CREAM | CUTANEOUS | Status: AC

## 2013-09-22 NOTE — Progress Notes (Signed)
CC: Janet Joyce is a 57 y.o. female is here for Coagulation Disorder   Subjective: HPI:  Followup anticoagulation for atrial fibrillation with history of aortic valve replacement: Coumadin regimen is Now half of a 2 mg tab on Tuesday, Thursday. A 2 mg tablet all other days. She was subtherapeutic last week at 1.5. She reports no missed doses or change in diet. Denies shortness of breath, irregular heartbeat, chest pain, orthopnea nor peripheral edema. Denies bleeding or bruising  Requesting refill of oxycodone for chronic back pain following lung biopsy. Pain has not changed in character or frequency since onset years ago, she gets complete resolution of pain with oxycodone. Pain is described only as a deep pain in the midline of the back radiating to the right posterior rib cage.  Patient complains of cough that has been present for 1-1/2 months now. Slightly improved when started on antibiotics however nonproductive cough has persisted. She denies coughing up blood nor wheezing. It is present all hours of the day but does not interfere sleep much. Nothing particularly makes it better or worse denies fevers, chills, nausea  Complains of rash under both breasts that has been present for months has not improving with moisturizing creams. Described as itching and burning. She denies rashes elsewhere    Review Of Systems Outlined In HPI  Past Medical History  Diagnosis Date  . Heart attack   . Hyperlipidemia   . Depression      Family History  Problem Relation Age of Onset  . Diabetes Father      History  Substance Use Topics  . Smoking status: Current Every Day Smoker -- 0.50 packs/day    Types: Cigarettes  . Smokeless tobacco: Not on file  . Alcohol Use: No     Objective: Filed Vitals:   09/22/13 1308  BP: 106/74  Pulse: 73    General: Alert and Oriented, No Acute Distress HEENT: Pupils equal, round, reactive to light. Conjunctivae clear.  External ears unremarkable,  canals clear with intact TMs with appropriate landmarks.  Middle ear appears open without effusion. Pink inferior turbinates.  Moist mucous membranes, pharynx without inflammation nor lesions.  Neck supple without palpable lymphadenopathy nor abnormal masses. Lungs: Clear to auscultation bilaterally, no wheezing/ronchi/rales.  Comfortable work of breathing. Good air movement. Cardiac: Irregularly irregular rhythm with mechanical S2  Extremities: No peripheral edema.  Strong peripheral pulses.  Mental Status: No depression, anxiety, nor agitation. Skin: Warm and dry. Erythema with satellite lesions underneath left breast and right breast  Assessment & Plan: Dulse was seen today for coagulation disorder.  Diagnoses and associated orders for this visit:  Atrial fibrillation - POCT INR  H/O aortic valve replacement  Chronic back pain - oxyCODONE-acetaminophen (PERCOCET) 10-325 MG per tablet; Take 1 tablet by mouth every 6 (six) hours as needed for pain.  Cough  Candida infection  Other Orders - Discontinue: clotrimazole (LOTRIMIN) 1 % cream; Apply to affected areas twice a day for up to four weeks, applying up to two weeks after resolution of symptoms. - predniSONE (DELTASONE) 20 MG tablet; Three tabs daily days 1-3, two tabs daily days 4-6, one tab daily days 7-9, half tab daily days 10-13. - clotrimazole (LOTRIMIN) 1 % cream; Apply to affected areas twice a day for up to four weeks, applying up to two weeks after resolution of symptoms.    Atrial fibrillation in the setting of aortic valve replacement: INR 2.2 subtherapeutic given her fluctuating INRs I would like her to continue her  current regimen with repeat INR next week. Chronic back pain: Controlled continue oxycodone Candida infection: Start clotrimazole under the breasts Cough: Prednisone taper, if not improved by next visit will strongly consider x-ray  25 minutes spent face-to-face during visit today of which at least  50% was counseling or coordinating care regarding atrial fibrillation, anticoagulation, aortic valve replacement, cough, chronic back pain, Candida infection.   Return in about 1 week (around 09/29/2013).

## 2013-09-29 ENCOUNTER — Ambulatory Visit (INDEPENDENT_AMBULATORY_CARE_PROVIDER_SITE_OTHER): Payer: Medicare Other | Admitting: Family Medicine

## 2013-09-29 ENCOUNTER — Telehealth: Payer: Self-pay | Admitting: *Deleted

## 2013-09-29 ENCOUNTER — Encounter: Payer: Self-pay | Admitting: *Deleted

## 2013-09-29 VITALS — BP 123/81 | HR 73 | Wt 153.0 lb

## 2013-09-29 DIAGNOSIS — I4891 Unspecified atrial fibrillation: Secondary | ICD-10-CM

## 2013-09-29 NOTE — Telephone Encounter (Signed)
Called pt and ask her to go to lab to have PT/INR done and pt wants to wait until Monday to have it done. Informed Dr. Ivan Anchors.  Meyer Cory, LPN

## 2013-10-02 DIAGNOSIS — I4891 Unspecified atrial fibrillation: Secondary | ICD-10-CM | POA: Diagnosis not present

## 2013-10-13 ENCOUNTER — Encounter: Payer: Self-pay | Admitting: *Deleted

## 2013-10-13 ENCOUNTER — Ambulatory Visit (INDEPENDENT_AMBULATORY_CARE_PROVIDER_SITE_OTHER): Payer: Medicare Other | Admitting: Family Medicine

## 2013-10-13 VITALS — BP 113/66 | HR 63

## 2013-10-13 DIAGNOSIS — Z951 Presence of aortocoronary bypass graft: Secondary | ICD-10-CM

## 2013-10-13 DIAGNOSIS — Z952 Presence of prosthetic heart valve: Secondary | ICD-10-CM

## 2013-10-13 DIAGNOSIS — Z954 Presence of other heart-valve replacement: Secondary | ICD-10-CM | POA: Diagnosis not present

## 2013-10-13 DIAGNOSIS — I4891 Unspecified atrial fibrillation: Secondary | ICD-10-CM

## 2013-10-13 LAB — PROTIME-INR
INR: 3.18 — ABNORMAL HIGH (ref <1.50)
Prothrombin Time: 31.1 seconds — ABNORMAL HIGH (ref 11.6–15.2)

## 2013-10-13 LAB — POCT INR: INR: 4.3

## 2013-10-13 NOTE — Progress Notes (Signed)
  Subjective:    Patient ID: Janet Joyce, female    DOB: April 02, 1956, 57 y.o.   MRN: [id number]  HPI  Patient is here for a INR. The value was above 4.0. She was sent to the lab for repeat INR.   Review of Systems     Objective:   Physical Exam        Assessment & Plan:

## 2013-10-16 ENCOUNTER — Telehealth: Payer: Self-pay | Admitting: Family Medicine

## 2013-10-16 DIAGNOSIS — I4891 Unspecified atrial fibrillation: Secondary | ICD-10-CM

## 2013-10-16 NOTE — Telephone Encounter (Signed)
Sue Lush, Will you please let Mrs. Amalfitano know that her INR from the lab was perfect at 3.2, no need to change her coumadin regimen.  I'd still like her to have her INR rechecked on Thurs or Fri this week however let's do use the lab so we can make sure she gets an accurate value, just stop by the clinic to pick up an INR order that I've placed up front.

## 2013-10-16 NOTE — Telephone Encounter (Signed)
Pt.notified

## 2013-10-19 ENCOUNTER — Telehealth: Payer: Self-pay | Admitting: *Deleted

## 2013-10-19 DIAGNOSIS — G8929 Other chronic pain: Secondary | ICD-10-CM

## 2013-10-19 MED ORDER — OXYCODONE-ACETAMINOPHEN 10-325 MG PO TABS: 1.0000 | ORAL_TABLET | Freq: Four times a day (QID) | ORAL | Status: DC | PRN

## 2013-10-19 NOTE — Telephone Encounter (Signed)
Pt called and wanted to know if she could pick up a refill on her pain medication tomorrow. It is due on the 10th but pt states she wont be able to come by the office on monday

## 2013-10-20 ENCOUNTER — Other Ambulatory Visit: Payer: Self-pay | Admitting: Family Medicine

## 2013-10-20 DIAGNOSIS — Z954 Presence of other heart-valve replacement: Secondary | ICD-10-CM | POA: Diagnosis not present

## 2013-10-20 DIAGNOSIS — Z951 Presence of aortocoronary bypass graft: Secondary | ICD-10-CM | POA: Diagnosis not present

## 2013-10-20 DIAGNOSIS — I4891 Unspecified atrial fibrillation: Secondary | ICD-10-CM | POA: Diagnosis not present

## 2013-10-20 MED ORDER — OXYCODONE-ACETAMINOPHEN 10-325 MG PO TABS: 1.0000 | ORAL_TABLET | Freq: Four times a day (QID) | ORAL | Status: DC | PRN

## 2013-10-20 NOTE — Telephone Encounter (Signed)
Janet Joyce, Rx placed in in-box ready for pickup/faxing.  

## 2013-10-20 NOTE — Telephone Encounter (Signed)
rx placed up front and left message on pt's vm

## 2013-10-23 NOTE — Telephone Encounter (Signed)
Sue Lush, Will you please let Janet Joyce know that her INR was 4.8, higher than optimal.  Last I knew she was taking coumadin 2mg  daily except for 1mg  on Tues and Thurs.  I'd encourage her to now take 2mg  on Monday, Wednesday, Friday and 1mg  all other days.  Repeat INR late this week.

## 2013-10-23 NOTE — Telephone Encounter (Signed)
Pt.notified

## 2013-10-27 ENCOUNTER — Ambulatory Visit: Payer: Medicare Other | Admitting: *Deleted

## 2013-10-30 ENCOUNTER — Telehealth: Payer: Self-pay | Admitting: *Deleted

## 2013-10-30 ENCOUNTER — Ambulatory Visit (INDEPENDENT_AMBULATORY_CARE_PROVIDER_SITE_OTHER): Payer: Medicare Other | Admitting: Sports Medicine

## 2013-10-30 VITALS — BP 105/68 | HR 73

## 2013-10-30 DIAGNOSIS — Z5181 Encounter for therapeutic drug level monitoring: Secondary | ICD-10-CM

## 2013-10-30 DIAGNOSIS — I4891 Unspecified atrial fibrillation: Secondary | ICD-10-CM

## 2013-10-30 DIAGNOSIS — Z7901 Long term (current) use of anticoagulants: Secondary | ICD-10-CM

## 2013-10-30 DIAGNOSIS — Z951 Presence of aortocoronary bypass graft: Secondary | ICD-10-CM

## 2013-10-30 DIAGNOSIS — Z952 Presence of prosthetic heart valve: Secondary | ICD-10-CM

## 2013-10-30 LAB — PROTIME-INR
INR: 6.73 (ref <1.50)
Prothrombin Time: 55.3 seconds — ABNORMAL HIGH (ref 11.6–15.2)

## 2013-10-30 LAB — POCT INR: INR: >8

## 2013-10-30 NOTE — Assessment & Plan Note (Signed)
Currently supratherapeutic. No signs of mucosal bleeding. Holding Coumadin until recheck in confirmation with lab. If less than 9 we will continue to hold Coumadin and recheck in one week. 9 or greater I will call in oral vitamin K.

## 2013-10-30 NOTE — Progress Notes (Signed)
  Subjective:    Patient ID: Janet Joyce, female    DOB: [birth year], 57 y.o.   MRN: [id number]  HPI   Pt here for INR. Pt states she has had some bruising. Pt states she is taking 2mg  on M,W,Fri, Sat, and half tablet on Tues and Thurs. (this is not how is was last rx'ed) Review of Systems     Objective:   Physical Exam        Assessment & Plan:  Sent pt to the lab to confirm and will call her today with results and instructions

## 2013-10-30 NOTE — Telephone Encounter (Signed)
Noted and agreed.

## 2013-10-30 NOTE — Telephone Encounter (Signed)
Per Dr. Benjamin Stain pt is to hold all coumadin and recheck INR next week.Pt notified and voiced understanding

## 2013-11-03 ENCOUNTER — Other Ambulatory Visit: Payer: Self-pay | Admitting: Family Medicine

## 2013-11-06 ENCOUNTER — Encounter: Payer: Self-pay | Admitting: Family Medicine

## 2013-11-06 ENCOUNTER — Ambulatory Visit (INDEPENDENT_AMBULATORY_CARE_PROVIDER_SITE_OTHER): Payer: Medicare Other | Admitting: Family Medicine

## 2013-11-06 VITALS — BP 121/72 | HR 67 | Wt 151.0 lb

## 2013-11-06 DIAGNOSIS — M629 Disorder of muscle, unspecified: Secondary | ICD-10-CM | POA: Diagnosis not present

## 2013-11-06 DIAGNOSIS — F4321 Adjustment disorder with depressed mood: Secondary | ICD-10-CM

## 2013-11-06 DIAGNOSIS — M7630 Iliotibial band syndrome, unspecified leg: Secondary | ICD-10-CM | POA: Insufficient documentation

## 2013-11-06 DIAGNOSIS — M7631 Iliotibial band syndrome, right leg: Secondary | ICD-10-CM

## 2013-11-06 DIAGNOSIS — Z952 Presence of prosthetic heart valve: Secondary | ICD-10-CM

## 2013-11-06 DIAGNOSIS — Z7901 Long term (current) use of anticoagulants: Secondary | ICD-10-CM

## 2013-11-06 LAB — POCT INR: INR: 1.9

## 2013-11-06 MED ORDER — WARFARIN SODIUM 2 MG PO TABS: ORAL_TABLET | ORAL | Status: DC
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
## 2013-11-15 ENCOUNTER — Ambulatory Visit (INDEPENDENT_AMBULATORY_CARE_PROVIDER_SITE_OTHER): Payer: Medicare Other | Admitting: Family Medicine

## 2013-11-15 ENCOUNTER — Encounter: Payer: Self-pay | Admitting: *Deleted

## 2013-11-15 VITALS — BP 100/62 | HR 68 | Temp 97.1°F

## 2013-11-15 DIAGNOSIS — I4891 Unspecified atrial fibrillation: Secondary | ICD-10-CM

## 2013-11-15 DIAGNOSIS — Z951 Presence of aortocoronary bypass graft: Secondary | ICD-10-CM

## 2013-11-15 DIAGNOSIS — Z952 Presence of prosthetic heart valve: Secondary | ICD-10-CM

## 2013-11-15 NOTE — Progress Notes (Signed)
Pt notified and voiced understanding 

## 2013-11-15 NOTE — Patient Instructions (Signed)
Please relay to patient that INR is perfect today, continue 1mg  coumadin daily and repeat INR one week, if perfect again can space out checks.

## 2013-11-15 NOTE — Progress Notes (Signed)
Pt has not missed any doses, no dietary changes, SOB, or Chest pain.

## 2013-11-15 NOTE — Progress Notes (Signed)
Please relay to patient that INR is perfect today, continue 1mg coumadin daily and repeat INR one week, if perfect again can space out checks. 

## 2013-11-21 ENCOUNTER — Other Ambulatory Visit: Payer: Self-pay | Admitting: *Deleted

## 2013-11-21 ENCOUNTER — Telehealth: Payer: Self-pay | Admitting: Family Medicine

## 2013-11-21 DIAGNOSIS — G8929 Other chronic pain: Secondary | ICD-10-CM

## 2013-11-21 MED ORDER — OXYCODONE-ACETAMINOPHEN 10-325 MG PO TABS: 1.0000 | ORAL_TABLET | Freq: Four times a day (QID) | ORAL | Status: DC | PRN

## 2013-11-21 NOTE — Telephone Encounter (Signed)
rx printed and placed up front.

## 2013-11-21 NOTE — Telephone Encounter (Signed)
Patient called she scheduled a nurse visit for tomorrow at 1:15 but she needs her percocet refilled. Thanks

## 2013-11-22 ENCOUNTER — Ambulatory Visit (INDEPENDENT_AMBULATORY_CARE_PROVIDER_SITE_OTHER): Payer: Medicare Other | Admitting: Family Medicine

## 2013-11-22 ENCOUNTER — Encounter: Payer: Self-pay | Admitting: *Deleted

## 2013-11-22 VITALS — BP 129/75 | HR 76 | Wt 150.0 lb

## 2013-11-22 DIAGNOSIS — Z952 Presence of prosthetic heart valve: Secondary | ICD-10-CM

## 2013-11-22 DIAGNOSIS — I4891 Unspecified atrial fibrillation: Secondary | ICD-10-CM | POA: Diagnosis not present

## 2013-11-22 DIAGNOSIS — Z951 Presence of aortocoronary bypass graft: Secondary | ICD-10-CM

## 2013-11-22 NOTE — Progress Notes (Signed)
Patient was in office for Coumadin check. INR was 3.5. Patient did not complain of anything abnormal. Janet Joyce,CMA

## 2013-11-22 NOTE — Progress Notes (Signed)
Please relay to patient that INR is perfect today, continue 1mg  coumadin daily and repeat INR one month.

## 2013-11-22 NOTE — Progress Notes (Signed)
Patient has been notified to continue 1 mg of coumadin daily and to recheck INR in 1 month. Rhonda Cunningham,CMA

## 2013-12-01 ENCOUNTER — Other Ambulatory Visit: Payer: Self-pay | Admitting: Sports Medicine

## 2013-12-22 ENCOUNTER — Ambulatory Visit (INDEPENDENT_AMBULATORY_CARE_PROVIDER_SITE_OTHER): Payer: Medicare Other | Admitting: Family Medicine

## 2013-12-22 ENCOUNTER — Encounter: Payer: Self-pay | Admitting: Family Medicine

## 2013-12-22 VITALS — BP 127/77 | HR 72 | Wt 144.0 lb

## 2013-12-22 DIAGNOSIS — G8929 Other chronic pain: Secondary | ICD-10-CM

## 2013-12-22 DIAGNOSIS — Z954 Presence of other heart-valve replacement: Secondary | ICD-10-CM

## 2013-12-22 DIAGNOSIS — M7630 Iliotibial band syndrome, unspecified leg: Secondary | ICD-10-CM

## 2013-12-22 DIAGNOSIS — E785 Hyperlipidemia, unspecified: Secondary | ICD-10-CM

## 2013-12-22 DIAGNOSIS — I10 Essential (primary) hypertension: Secondary | ICD-10-CM

## 2013-12-22 DIAGNOSIS — M2420 Disorder of ligament, unspecified site: Secondary | ICD-10-CM

## 2013-12-22 DIAGNOSIS — M549 Dorsalgia, unspecified: Secondary | ICD-10-CM | POA: Diagnosis not present

## 2013-12-22 DIAGNOSIS — Z952 Presence of prosthetic heart valve: Secondary | ICD-10-CM

## 2013-12-22 DIAGNOSIS — R7303 Prediabetes: Secondary | ICD-10-CM

## 2013-12-22 DIAGNOSIS — I4891 Unspecified atrial fibrillation: Secondary | ICD-10-CM

## 2013-12-22 DIAGNOSIS — R7309 Other abnormal glucose: Secondary | ICD-10-CM

## 2013-12-22 DIAGNOSIS — M25559 Pain in unspecified hip: Secondary | ICD-10-CM

## 2013-12-22 DIAGNOSIS — M629 Disorder of muscle, unspecified: Secondary | ICD-10-CM

## 2013-12-22 DIAGNOSIS — M25551 Pain in right hip: Secondary | ICD-10-CM

## 2013-12-22 LAB — POCT INR: INR: 3.2

## 2013-12-22 MED ORDER — OXYCODONE-ACETAMINOPHEN 10-325 MG PO TABS: 1.0000 | ORAL_TABLET | Freq: Four times a day (QID) | ORAL | Status: DC | PRN

## 2013-12-22 NOTE — Progress Notes (Signed)
CC: Janet Joyce is a 58 y.o. female is here for Coagulation Disorder   Subjective: HPI:  Followup anticoagulation: She continues on Coumadin 1 mg daily without missed doses she denies any bruising, bleeding, nor shortness of breath. Denies chest pain, motor sensory disturbances.  Follow chronic back pain: She denies any change in character, frequency, nor severity of her low to mid back pain. It is nonradiating and worse only with long activities really improved with oxycodone.  Complains of worsening right hip pain localized to the lateral aspect of the hip radiated into the right buttock is worse with standing for long periods of time or walking longer than the distance of a typical supermarket asile.  She denies weakness of either lower extremity nor recent or remote trauma to the right hip. She denies groin pain,, there have been no genitourinary complaints  Followup prediabetes: Last A1c was in a prediabetic range measured in June. Denies polyuria polyphasia or polydipsia nor poorly healing wounds  Essential hypertension: Continues on Imdur and carvedilol no outside blood pressures to report.     Review Of Systems Outlined In HPI  Past Medical History  Diagnosis Date  . Heart attack   . Hyperlipidemia   . Depression      Family History  Problem Relation Age of Onset  . Diabetes Father      History  Substance Use Topics  . Smoking status: Current Every Day Smoker -- 0.50 packs/day    Types: Cigarettes  . Smokeless tobacco: Not on file  . Alcohol Use: No     Objective: Filed Vitals:   12/22/13 1308  BP: 127/77  Pulse: 72    General: Alert and Oriented, No Acute Distress HEENT: Pupils equal, round, reactive to light. Conjunctivae clear.  Moist mucous membranes pharynx unremarkable Lungs: Clear to auscultation bilaterally, no wheezing/ronchi/rales.  Comfortable work of breathing. Good air movement. Cardiac: Regular rate and rhythm. Normal S1 mechanical S2.  No  murmurs, rubs, nor gallops.   Right hip exam: Log roll negative, straight leg raise negative, pain is reproduced with palpation of right greater trochanter, FABER/FADIR negative Extremities: No peripheral edema.  Strong peripheral pulses.  Back: No midline spinous process tenderness in the lumbar region she has full range of motion and strength and lumbar region Mental Status: No depression, anxiety, nor agitation. Skin: Warm and dry.  Assessment & Plan: Terril was seen today for coagulation disorder.  Diagnoses and associated orders for this visit:  Atrial fibrillation - POCT INR  H/O aortic valve replacement  Chronic back pain - oxyCODONE-acetaminophen (PERCOCET) 10-325 MG per tablet; Take 1 tablet by mouth every 6 (six) hours as needed for pain.  Hyperlipidemia LDL goal <70 - Lipid Profile  Prediabetes - BASIC METABOLIC PANEL WITH GFR - Hemoglobin A1c  Essential hypertension, benign - BASIC METABOLIC PANEL WITH GFR  Right hip pain - Ambulatory referral to Physical Therapy  Iliotibial band syndrome - Ambulatory referral to Physical Therapy    Atrial fibrillation with aortic valve replacement requiring anticoagulation: INR is therapeutic today continue 1 mg of Coumadin on a daily basis, rate is controlled today and it sounds like she is in sinus rhythm Chronic back pain: Controlled continue oxycodone Hyperlipidemia: Due for repeat lipid panel for the time being continue statin Essential hypertension: Controlled continue carvedilol and Imdur Right hip pain: Referral to physical therapy suspect large component of this is due to iliotibial band syndrome Return if symptoms worsen or fail to improve.

## 2014-01-02 DIAGNOSIS — R7309 Other abnormal glucose: Secondary | ICD-10-CM | POA: Diagnosis not present

## 2014-01-02 DIAGNOSIS — E785 Hyperlipidemia, unspecified: Secondary | ICD-10-CM | POA: Diagnosis not present

## 2014-01-02 DIAGNOSIS — I10 Essential (primary) hypertension: Secondary | ICD-10-CM | POA: Diagnosis not present

## 2014-01-03 LAB — BASIC METABOLIC PANEL WITH GFR
BUN: 8 mg/dL (ref 6–23)
CALCIUM: 9.5 mg/dL (ref 8.4–10.5)
CHLORIDE: 106 meq/L (ref 96–112)
CO2: 27 meq/L (ref 19–32)
Creat: 0.76 mg/dL (ref 0.50–1.10)
GFR, Est African American: >89 mL/min
GFR, Est Non African American: 89 mL/min
Glucose, Bld: 102 mg/dL — ABNORMAL HIGH (ref 70–99)
POTASSIUM: 4 meq/L (ref 3.5–5.3)
SODIUM: 141 meq/L (ref 135–145)

## 2014-01-03 LAB — LIPID PANEL
Cholesterol: 139 mg/dL (ref 0–200)
HDL: 33 mg/dL — AB (ref >39)
LDL Cholesterol: 53 mg/dL (ref 0–99)
Total CHOL/HDL Ratio: 4.2 Ratio
Triglycerides: 265 mg/dL — ABNORMAL HIGH (ref <150)
VLDL: 53 mg/dL — ABNORMAL HIGH (ref 0–40)

## 2014-01-03 LAB — HEMOGLOBIN A1C
Hgb A1c MFr Bld: 5.5 % (ref <5.7)
MEAN PLASMA GLUCOSE: 111 mg/dL (ref <117)

## 2014-01-05 ENCOUNTER — Other Ambulatory Visit: Payer: Self-pay | Admitting: Family Medicine

## 2014-01-08 NOTE — Telephone Encounter (Signed)
Andrea, Rx placed in in-box ready for pickup/faxing.  

## 2014-01-08 NOTE — Telephone Encounter (Signed)
She is due for this. Are you ok filling?

## 2014-01-19 ENCOUNTER — Ambulatory Visit: Payer: Medicare Other

## 2014-01-23 ENCOUNTER — Ambulatory Visit: Payer: Medicare Other

## 2014-01-23 ENCOUNTER — Ambulatory Visit (INDEPENDENT_AMBULATORY_CARE_PROVIDER_SITE_OTHER): Payer: Medicare Other | Admitting: Family Medicine

## 2014-01-23 ENCOUNTER — Encounter: Payer: Self-pay | Admitting: Family Medicine

## 2014-01-23 VITALS — BP 114/66 | HR 69 | Wt 142.0 lb

## 2014-01-23 DIAGNOSIS — G8929 Other chronic pain: Secondary | ICD-10-CM

## 2014-01-23 DIAGNOSIS — I4891 Unspecified atrial fibrillation: Secondary | ICD-10-CM | POA: Diagnosis not present

## 2014-01-23 DIAGNOSIS — Z954 Presence of other heart-valve replacement: Secondary | ICD-10-CM | POA: Diagnosis not present

## 2014-01-23 DIAGNOSIS — E781 Pure hyperglyceridemia: Secondary | ICD-10-CM

## 2014-01-23 DIAGNOSIS — M549 Dorsalgia, unspecified: Secondary | ICD-10-CM | POA: Diagnosis not present

## 2014-01-23 DIAGNOSIS — E785 Hyperlipidemia, unspecified: Secondary | ICD-10-CM

## 2014-01-23 DIAGNOSIS — Z952 Presence of prosthetic heart valve: Secondary | ICD-10-CM

## 2014-01-23 LAB — POCT INR: INR: 1.6

## 2014-01-23 MED ORDER — OXYCODONE-ACETAMINOPHEN 10-325 MG PO TABS: 1.0000 | ORAL_TABLET | Freq: Four times a day (QID) | ORAL | Status: DC | PRN

## 2014-01-23 MED ORDER — GEMFIBROZIL 600 MG PO TABS: 600.0000 mg | ORAL_TABLET | Freq: Two times a day (BID) | ORAL | Status: DC

## 2014-01-23 MED ORDER — ATORVASTATIN CALCIUM 40 MG PO TABS: 40.0000 mg | ORAL_TABLET | Freq: Every day | ORAL | Status: DC

## 2014-01-23 NOTE — Progress Notes (Signed)
CC: Janet Joyce is a 58 y.o. female is here for f/u labs and Coagulation Disorder   Subjective: HPI:  Followup aortic valve replacement on anticoagulation. She's taking 1 mg of Coumadin on a daily basis she denies easy bruisability or any bleeding issues nor any new motor or sensory disturbances. Denies shortness of breath chest pain or limb claudication denies orthopnea. She suffers from atrial fibrillation to believes her heart rate has been in a normal and regular rhythm and rate.  Followup hypertriglyceridemia: The last month her triglycerides remain above 150 she had stopped taking TriCor to see she still needed to be on it. She has been off of this for at least 2 months prior to lipid panel last month.  Denies right upper quadrant pain or epigastric discomfort. It has come to our attention of TriCor we'll no longer be covered by her insurance  Followup hyperlipidemia: She continues on Crestor a daily basis without myalgias nor right upper quadrant pain however her insurance company is no longer covering this medication  Followup chronic back pain: Denies any change in her central lumbar and thoracic back pain. Specifically denies any character change, frequency change, nor severity change. Pain is 100% alleviated with her current Percocet regimen 1 tablet every 6-8 hours.   Review Of Systems Outlined In HPI  Past Medical History  Diagnosis Date  . Heart attack   . Hyperlipidemia   . Depression     No past surgical history on file. Family History  Problem Relation Age of Onset  . Diabetes Father     History   Social History  . Marital Status: Unknown    Spouse Name: N/A    Number of Children: N/A  . Years of Education: N/A   Occupational History  . Not on file.   Social History Main Topics  . Smoking status: Current Every Day Smoker -- 0.50 packs/day    Types: Cigarettes  . Smokeless tobacco: Not on file  . Alcohol Use: No  . Drug Use: No  . Sexual Activity: No    Other Topics Concern  . Not on file   Social History Narrative  . No narrative on file     Objective: BP 114/66  Pulse 69  Wt 142 lb (64.411 kg)  General: Alert and Oriented, No Acute Distress HEENT: Pupils equal, round, reactive to light. Conjunctivae clear.   moist mucous membranes pharynx unremarkable  Lungs: Clear to auscultation bilaterally, no wheezing/ronchi/rales.  Comfortable work of breathing. Good air movement. Cardiac: Regular rate and rhythm. Normal  mechanical S2 normal S1.  No murmurs, rubs, nor gallops.   Abdomen: Normal bowel sounds, soft and non tender without palpable masses. Back: Full range of motion strength in the lumbar and thoracic spine  Extremities: No peripheral edema.  Strong peripheral pulses.  Mental Status: No depression, anxiety, nor agitation. Skin: Warm and dry.  Assessment & Plan: Janet Joyce was seen today for f/u labs and coagulation disorder.  Diagnoses and associated orders for this visit:  H/O aortic valve replacement - POCT INR  Atrial fibrillation - POCT INR  Hypertriglyceridemia - gemfibrozil (LOPID) 600 MG tablet; Take 1 tablet (600 mg total) by mouth 2 (two) times daily before a meal.  Hyperlipidemia LDL goal <70 - atorvastatin (LIPITOR) 40 MG tablet; Take 1 tablet (40 mg total) by mouth daily.  Chronic back pain - oxyCODONE-acetaminophen (PERCOCET) 10-325 MG per tablet; Take 1 tablet by mouth every 6 (six) hours as needed for pain.    atrial  fibrillation and history of aortic valve replacement: Atrial fibrillation is rate controlled at this point continue carvedilol, her INR is subtherapeutic we'll repeat this in one week since we are changing medications below that will certainly influence her INR   hypertriglyceridemia: Uncontrolled start gemfibrozil, this appears to be tier 2 on her insurance plan. We spent time during our visit looking at her formulary together Hyperlipidemia: At goal however will need to switch from  Crestor now taking Lipitor due to insurance plan Chronic back pain: Control continue Percocet as needed  25 minutes spent face-to-face during visit today of which at least 50% was counseling or coordinating care regarding: 1. H/O aortic valve replacement   2. Atrial fibrillation   3. Hypertriglyceridemia   4. Hyperlipidemia LDL goal <70   5. Chronic back pain       Return in about 1 week (around 01/30/2014) for INR.

## 2014-01-30 ENCOUNTER — Ambulatory Visit: Payer: Medicare Other

## 2014-02-02 ENCOUNTER — Ambulatory Visit (INDEPENDENT_AMBULATORY_CARE_PROVIDER_SITE_OTHER): Payer: Medicare Other | Admitting: Sports Medicine

## 2014-02-02 ENCOUNTER — Other Ambulatory Visit: Payer: Self-pay | Admitting: Family Medicine

## 2014-02-02 DIAGNOSIS — Z951 Presence of aortocoronary bypass graft: Secondary | ICD-10-CM

## 2014-02-02 DIAGNOSIS — I4891 Unspecified atrial fibrillation: Secondary | ICD-10-CM

## 2014-02-02 DIAGNOSIS — Z952 Presence of prosthetic heart valve: Secondary | ICD-10-CM

## 2014-02-02 LAB — POCT INR: INR: 1.9

## 2014-02-05 ENCOUNTER — Telehealth: Payer: Self-pay | Admitting: *Deleted

## 2014-02-05 ENCOUNTER — Other Ambulatory Visit: Payer: Self-pay | Admitting: Family Medicine

## 2014-02-05 NOTE — Telephone Encounter (Signed)
Pt is calling about her INR results. I looked back at her last anticoag instructions and gave pt instructions. Pt also notified that tramadol has been sent to pharm

## 2014-02-07 ENCOUNTER — Telehealth: Payer: Self-pay | Admitting: *Deleted

## 2014-02-07 NOTE — Telephone Encounter (Signed)
Pt left a vm that ever since she started the last two medications that were last rx'ed she has had sores in her mouth. Pt is unsure which medications were rx'ed last. (lopid? And lipitor?)

## 2014-02-09 NOTE — Telephone Encounter (Signed)
I'd recommend taking two weeks off of gemfibrozil but continue atorvastatin.  After these two weeks restart gemfibrozil and if any sores return we'll find an alternative.

## 2014-02-09 NOTE — Telephone Encounter (Signed)
Pt notified and voiced understanding 

## 2014-02-15 ENCOUNTER — Ambulatory Visit (INDEPENDENT_AMBULATORY_CARE_PROVIDER_SITE_OTHER): Payer: Medicare Other

## 2014-02-15 DIAGNOSIS — M2420 Disorder of ligament, unspecified site: Secondary | ICD-10-CM

## 2014-02-15 DIAGNOSIS — M629 Disorder of muscle, unspecified: Secondary | ICD-10-CM

## 2014-02-15 DIAGNOSIS — M25559 Pain in unspecified hip: Secondary | ICD-10-CM

## 2014-02-15 DIAGNOSIS — M6281 Muscle weakness (generalized): Secondary | ICD-10-CM

## 2014-02-15 DIAGNOSIS — M25659 Stiffness of unspecified hip, not elsewhere classified: Secondary | ICD-10-CM

## 2014-02-20 ENCOUNTER — Encounter: Payer: Self-pay | Admitting: Family Medicine

## 2014-02-20 ENCOUNTER — Ambulatory Visit (INDEPENDENT_AMBULATORY_CARE_PROVIDER_SITE_OTHER): Payer: Medicare Other | Admitting: Family Medicine

## 2014-02-20 VITALS — BP 115/71 | HR 74 | Wt 143.0 lb

## 2014-02-20 DIAGNOSIS — M549 Dorsalgia, unspecified: Secondary | ICD-10-CM

## 2014-02-20 DIAGNOSIS — Z954 Presence of other heart-valve replacement: Secondary | ICD-10-CM

## 2014-02-20 DIAGNOSIS — I4891 Unspecified atrial fibrillation: Secondary | ICD-10-CM

## 2014-02-20 DIAGNOSIS — E785 Hyperlipidemia, unspecified: Secondary | ICD-10-CM

## 2014-02-20 DIAGNOSIS — Z952 Presence of prosthetic heart valve: Secondary | ICD-10-CM

## 2014-02-20 DIAGNOSIS — E781 Pure hyperglyceridemia: Secondary | ICD-10-CM | POA: Diagnosis not present

## 2014-02-20 DIAGNOSIS — G8929 Other chronic pain: Secondary | ICD-10-CM

## 2014-02-20 MED ORDER — OXYCODONE-ACETAMINOPHEN 10-325 MG PO TABS: 1.0000 | ORAL_TABLET | Freq: Four times a day (QID) | ORAL | Status: DC | PRN

## 2014-02-20 MED ORDER — ROSUVASTATIN CALCIUM 40 MG PO TABS: 40.0000 mg | ORAL_TABLET | Freq: Every day | ORAL | Status: DC

## 2014-02-20 NOTE — Progress Notes (Signed)
CC: Janet Joyce is a 58 y.o. female is here for Coagulation Disorder   Subjective: HPI:  Followup atrial fibrillation with aortic valve replacement: Continues on Coumadin on a daily basis without missed doses. Denies bleeding or bruising. Denies motor or sensory services, chest pain, shortness of breath, nor peripheral edema. Denies racing heart  Hypertriglyceridemia-hyperlipidemia: She believes that either Lipitor or gemfibrozil was causing ulceration within her mouth symptoms began soon after she began this medicine she stopped taking it on Saturday and her complaints are starting to be alleviated without any other intervention. She would like to go back on Crestor if possible  Chronic back pain: Denies any worsening in her character, severity, nor frequency of back pain. It is 100% relieved on her current regimen of Percocet taken every 6 hours not interfering with quality of life denies side effects from medications   Review Of Systems Outlined In HPI  Past Medical History  Diagnosis Date  . Heart attack   . Hyperlipidemia   . Depression     No past surgical history on file. Family History  Problem Relation Age of Onset  . Diabetes Father     History   Social History  . Marital Status: Unknown    Spouse Name: N/A    Number of Children: N/A  . Years of Education: N/A   Occupational History  . Not on file.   Social History Main Topics  . Smoking status: Current Every Day Smoker -- 0.50 packs/day    Types: Cigarettes  . Smokeless tobacco: Not on file  . Alcohol Use: No  . Drug Use: No  . Sexual Activity: No   Other Topics Concern  . Not on file   Social History Narrative  . No narrative on file     Objective: BP 115/71  Pulse 74  Wt 143 lb (64.864 kg)  General: Alert and Oriented, No Acute Distress HEENT: Pupils equal, round, reactive to light. Conjunctivae clear.  Moist mucous membranes, pharynx without inflammation nor lesions.  After removal of  dentures there are no visualized mucosal abnormalities in the mouth or tongue abnormalities. Neck supple without palpable lymphadenopathy nor abnormal masses. Lungs: Clear to auscultation bilaterally, no wheezing/ronchi/rales.  Comfortable work of breathing. Good air movement. Cardiac: Regular rate and rhythm. Normal S1, mechanical S2.  No murmurs, rubs, nor gallops.   Back: No midline spinous process tenderness or paraspinal process tenderness in the lumbar or thoracic region Extremities: No peripheral edema.  Strong peripheral pulses.  Mental Status: No depression, anxiety, nor agitation. Skin: Warm and dry.  Assessment & Plan: Janet Joyce was seen today for coagulation disorder.  Diagnoses and associated orders for this visit:  Atrial fibrillation - INR/PT  H/O aortic valve replacement - INR/PT  Hypertriglyceridemia  Hyperlipidemia LDL goal <70  Chronic back pain - oxyCODONE-acetaminophen (PERCOCET) 10-325 MG per tablet; Take 1 tablet by mouth every 6 (six) hours as needed for pain.  Other Orders - rosuvastatin (CRESTOR) 40 MG tablet; Take 1 tablet (40 mg total) by mouth daily.    atrial fibrillation with aortic valve replacement: Currently rate controlled she is due for an INR will send her to the lab for this Hypertriglyceridemia and hyperlipidemia: Uncontrolled without statin therapy we will retry her Crestor prescription to see if this can past prior off for intolerance of generic Lipitor Chronic back pain: Control continue Percocet regimen   Return in about 4 weeks (around 03/20/2014).

## 2014-02-21 ENCOUNTER — Encounter (INDEPENDENT_AMBULATORY_CARE_PROVIDER_SITE_OTHER): Payer: Medicare Other

## 2014-02-21 DIAGNOSIS — M6281 Muscle weakness (generalized): Secondary | ICD-10-CM

## 2014-02-21 DIAGNOSIS — M629 Disorder of muscle, unspecified: Secondary | ICD-10-CM

## 2014-02-21 DIAGNOSIS — M25559 Pain in unspecified hip: Secondary | ICD-10-CM

## 2014-02-21 DIAGNOSIS — M25659 Stiffness of unspecified hip, not elsewhere classified: Secondary | ICD-10-CM

## 2014-02-21 LAB — PROTIME-INR
INR: 1.3 (ref <1.50)
Prothrombin Time: 16 seconds — ABNORMAL HIGH (ref 11.6–15.2)

## 2014-02-22 DIAGNOSIS — F322 Major depressive disorder, single episode, severe without psychotic features: Secondary | ICD-10-CM | POA: Diagnosis not present

## 2014-02-28 ENCOUNTER — Ambulatory Visit (INDEPENDENT_AMBULATORY_CARE_PROVIDER_SITE_OTHER): Payer: Medicare Other | Admitting: Family Medicine

## 2014-02-28 DIAGNOSIS — Z951 Presence of aortocoronary bypass graft: Secondary | ICD-10-CM

## 2014-02-28 DIAGNOSIS — Z954 Presence of other heart-valve replacement: Secondary | ICD-10-CM | POA: Diagnosis not present

## 2014-02-28 DIAGNOSIS — I4891 Unspecified atrial fibrillation: Secondary | ICD-10-CM | POA: Diagnosis not present

## 2014-02-28 DIAGNOSIS — Z952 Presence of prosthetic heart valve: Secondary | ICD-10-CM

## 2014-02-28 LAB — POCT INR: INR: 1.7

## 2014-02-28 NOTE — Patient Instructions (Signed)
Per Dr. Ileene Rubens pt is to 2mg  tablet on Mon, Wed, Fri and 1/2 tablet all other days

## 2014-03-01 ENCOUNTER — Telehealth: Payer: Self-pay | Admitting: Family Medicine

## 2014-03-01 DIAGNOSIS — Z952 Presence of prosthetic heart valve: Secondary | ICD-10-CM

## 2014-03-01 DIAGNOSIS — I4891 Unspecified atrial fibrillation: Secondary | ICD-10-CM

## 2014-03-01 NOTE — Telephone Encounter (Signed)
Pt notified and voiced understanding. She states she may go to the lab tomorrow. i will put order in

## 2014-03-01 NOTE — Telephone Encounter (Signed)
Pt called. She is concerned that her Coumadin was increased and her blood is already thin.

## 2014-03-01 NOTE — Telephone Encounter (Signed)
INR yesterday was 1.7 with a goal of 2.5-3.5. Coumadin was increased to increase INR and hopefully further "thin" out blood.  If she feels that her blood is already thinned out too much I'd strongly encourage her to go to the lab to have her INR done today or tomorrow.  Please provide order if she is interested in this.  (Note, patient refused to go to the lab yesterday even after she was told that it would be more accurate)

## 2014-03-06 ENCOUNTER — Telehealth: Payer: Self-pay | Admitting: Family Medicine

## 2014-03-06 MED ORDER — CHLORDIAZEPOXIDE-AMITRIPTYLINE 5-12.5 MG PO TABS: ORAL_TABLET | ORAL | Status: DC

## 2014-03-06 NOTE — Telephone Encounter (Signed)
Andrea, Rx placed in in-box ready for pickup/faxing.  

## 2014-03-06 NOTE — Telephone Encounter (Signed)
faxed

## 2014-03-07 DIAGNOSIS — I4891 Unspecified atrial fibrillation: Secondary | ICD-10-CM | POA: Diagnosis not present

## 2014-03-07 DIAGNOSIS — Z954 Presence of other heart-valve replacement: Secondary | ICD-10-CM | POA: Diagnosis not present

## 2014-03-07 LAB — PROTIME-INR
INR: 1.33 (ref <1.50)
Prothrombin Time: 16.3 seconds — ABNORMAL HIGH (ref 11.6–15.2)

## 2014-03-08 ENCOUNTER — Encounter: Payer: Self-pay | Admitting: Family Medicine

## 2014-03-14 ENCOUNTER — Ambulatory Visit (INDEPENDENT_AMBULATORY_CARE_PROVIDER_SITE_OTHER): Payer: Medicare Other | Admitting: Family Medicine

## 2014-03-14 ENCOUNTER — Encounter: Payer: Self-pay | Admitting: Family Medicine

## 2014-03-14 VITALS — BP 128/82 | HR 71 | Wt 139.0 lb

## 2014-03-14 DIAGNOSIS — I4891 Unspecified atrial fibrillation: Secondary | ICD-10-CM

## 2014-03-14 DIAGNOSIS — R5383 Other fatigue: Secondary | ICD-10-CM | POA: Diagnosis not present

## 2014-03-14 DIAGNOSIS — R5381 Other malaise: Secondary | ICD-10-CM

## 2014-03-14 DIAGNOSIS — Z954 Presence of other heart-valve replacement: Secondary | ICD-10-CM

## 2014-03-14 DIAGNOSIS — Z952 Presence of prosthetic heart valve: Secondary | ICD-10-CM

## 2014-03-14 LAB — CBC
HCT: 42 % (ref 36.0–46.0)
Hemoglobin: 13.7 g/dL (ref 12.0–15.0)
MCH: 28.4 pg (ref 26.0–34.0)
MCHC: 32.6 g/dL (ref 30.0–36.0)
MCV: 87.1 fL (ref 78.0–100.0)
Platelets: 232 10*3/uL (ref 150–400)
RBC: 4.82 MIL/uL (ref 3.87–5.11)
RDW: 14.8 % (ref 11.5–15.5)
WBC: 6.7 10*3/uL (ref 4.0–10.5)

## 2014-03-14 MED ORDER — WARFARIN SODIUM 2 MG PO TABS: ORAL_TABLET | ORAL | Status: DC

## 2014-03-14 NOTE — Patient Instructions (Signed)
Warfarin: What You Need to Know Warfarin is an anticoagulant. Anticoagulants help prevent the formation of blood clots. They also help stop the growth of blood clots. Warfarin is sometimes referred to as a "blood thinner."  Normally, when body tissues are cut or damaged, the blood clots in order to prevent blood loss. Sometimes clots form inside your blood vessels and obstruct the flow of blood through your circulatory system (thrombosis). These clots may travel through your bloodstream and become lodged in smaller blood vessels in your brain, which can cause a stroke, or your lungs (pulmonary embolism). WHO SHOULD USE WARFARIN? Warfarin is prescribed for people at risk of developing harmful blood clots:  People with surgically implanted mechanical heart valves, irregular heart rhythms called atrial fibrillation, and certain clotting disorders.  People who have developed harmful blood clotting in the past, including those who have had a stroke or a pulmonary embolism, or thrombosis in their legs (deep vein thrombosis [DVT]).  People with an existing blood clot such as a pulmonary embolism. WARFARIN DOSING Warfarin tablets come in different strengths. Each tablet strength is a different color, with the amount of warfarin (in milligrams) clearly printed on the tablet. If the color of your tablet is different than usual when you receive a new prescription, report it immediately to your pharmacist or health care provider. WARFARIN MONITORING The goal of warfarin therapy is to lessen the clotting tendency of blood but not to prevent clotting completely. Your health care provider will monitor the anticoagulation effect of warfarin closely and adjust your dose as needed. For your safety, blood tests called prothrombin time (PT) or international normalized ratio (INR) are used to measure the effects of warfarin. Both of these tests can be done with a finger stick or a blood draw. The longer it takes the blood  to clot, the higher the PT or INR. Your health care provider will inform you of your "target" PT or INR range. If, at any time, your PT or INR is above the target range, there is a risk of bleeding. If your PT or INR is below the target range, there is a risk of clotting. Whether you are started on warfarin while you are in the hospital, or in your health care provider's office, you will need to have your PT or INR checked within one week of starting the medicine. Initially, some people are asked to have their PT or INR checked as much as twice a week. Once you are on a stable maintenance dose, the PT or INR is checked less often, usually once every 2 to 4 weeks. The warfarin dose may be adjusted if the PT or INR is not within the target range. It is important to keep all laboratory and health care provider follow-up appointments.  WHAT ARE THE SIDE EFFECTS OF WARFARIN?  Too much warfarin can cause bleeding (hemorrhage) from any part of the body. This may include bleeding from the gums, blood in the urine, bloody or dark stools, a nosebleed that is not easily stopped, coughing up blood, or vomiting blood.  Too little warfarin can increase the risk of blood clots.  Too little or too much warfarin can also increase the risk of a stroke.  Warfarin use may cause a skin rash or irritation, an unusual fever, continual nausea or stomach upset, or severe pain in your joints or back. SPECIAL PRECAUTIONS WHILE TAKING WARFARIN Warfarin should be taken exactly as directed:  Take your medicine at the same time every day.  If you forget to take your dose, you can take it if it is within 6 hours of when it was due.  Do not change the dose of warfarin on your own to make up for missed or extra doses.  If you miss more than 2 doses in a row, you should contact your health care provider for advice. Avoid situations that cause bleeding. You may have a tendency to bleed more easily than usual while taking warfarin.  The following actions can limit bleeding:  Using a softer toothbrush.  Flossing with waxed floss rather than unwaxed floss.  Shaving with an electric razor rather than a blade.  Limiting the use of sharp objects.  Avoiding potentially harmful activities such as contact sports. Warfarin and Pregnancy or Breastfeeding  Warfarin is not advised during the first trimester of pregnancy due to an increased risk of birth defects. In certain situations, a woman may take warfarin after her first trimester of pregnancy. A woman who becomes pregnant or plans to become pregnant while taking warfarin should notify her health care provider immediately.  Although warfarin does not pass into breast milk, a woman who wishes to breastfeed while taking warfarin should also consult with her health care provider. Alcohol, Smoking, and Illicit Drug Use  Alcohol affects how warfarin works in the body. It is best to avoid alcoholic drinks or consume very small amounts while taking warfarin. In general, alcohol intake should be limited to 1 oz (30 mL) of liquor, 6 oz (180 mL) of wine, or 12 oz (360 mL) of beer each day. Notify your health care provider if you change your alcohol intake.  Smoking affects how warfarin works. It is best to avoid smoking while taking warfarin. Notify your health care provider if you change your smoking habits.  It is best to avoid all illicit drugs while taking warfarin since there are few studies that show how warfarin interacts with these drugs. Other Medicines and Dietary Supplements Many prescription and over-the-counter medicines can interfere with warfarin. Be sure all of your health care providers know you are taking warfarin. Notify your health care provider who prescribed warfarin for you before starting or stopping any new medicines, including over-the-counter vitamins, dietary supplements, and pain medicines. Your warfarin dose may need to be adjusted. Some common  over-the-counter medicines that may increase the risk of bleeding while taking warfarin include:   Acetaminophen.  Aspirin.  Nonsteroidal anti-inflammatory medicines such as ibuprofen or naproxen.  Vitamin E. Dietary Considerations  Foods that have moderate or high amounts of vitamin K can interfere with warfarin. Avoid major changes in your diet or notify your health care provider before changing your diet. Eat a consistent amount of foods that have moderate or high amounts of vitamin K.Eating less foods containing vitamin K can increase the risk of bleeding. Eating more foods containing vitamin K can increase the risk of blood clots. Additional questions about dietary considerations can be discussed with a dietitian. The serving size for foods containing moderate or high amounts of vitamin K are  cup cooked (120 mL or noted gram weight) or 1 cup raw (240 mL or noted gram weight), unless otherwise noted. These foods include: Proteins  Beef liver, 3.5 oz (100 g).  Pork liver, 3.5 oz (100 g). Legumes  Soybean oil.  Soybeans.  Garbanzo beans.  Green peas.  Black-eyed peas. Leafy green vegetables  Kale.  Spinach.  Nettle greens.  Swiss chard.  Watercress.  Endive.  Parsley, 1 tbsp (4 g).    3.5 oz (100 g).  · Pork liver, 3.5 oz (100 g).  Legumes  · Soybean oil.  · Soybeans.  · Garbanzo beans.  · Green peas.  · Black-eyed peas.  Leafy green vegetables  · Kale.  · Spinach.  · Nettle greens.  · Swiss chard.  · Watercress.  · Endive.  · Parsley, 1 tbsp (4 g).  · Turnip greens.  · Collard greens.  · Seaweed, limit 2 sheets.  · Beet greens.  · Dandelion greens.  · Mustard greens.  · Green Lead and Romaine lettuce.  Cruciferous vegetables  · Broccoli.  · Cabbage (green or Chinese).  · Brussels sprouts.  · Cauliflower.  · Asparagus.  Miscellaneous  · Onions, green onions, or spring onions.  · Green tea made with ½ oz (14 g) or more of dried tea.  · Herbal teas containing coumarin.  · Spinach noodles.  · Okra.  · Prunes.  · Pickles.  CALL YOUR CLINIC OR HEALTH CARE PROVIDER IF YOU:  · Plan to have any surgery or procedure.  · Feel sick, especially if you have diarrhea or vomiting.  · Experience or anticipate any major changes in your diet.  · Start or  stop a prescription or over-the-counter medicine.  · Become, plan to become, or think you may be pregnant.  · Are having heavier than usual menstrual periods.  · Have had a fall, accident, or any symptoms of bleeding or unusual bruising.  · An unusual fever.  CALL 911 IN THE [place] OR GO TO THE EMERGENCY DEPARTMENT IF YOU:   · Think you may be having an allergic reaction to warfarin. The signs of an allergic reaction could include itching, rash, hives, swelling, chest tightness, or trouble breathing.  · See signs of blood in your urine. The signs could include reddish, pinkish, or tea-colored urine.  · See signs of blood in your stools. The signs could include bright red or black stools.  · Vomit or cough up blood. In these instances, the blood could have either a bright red or a "coffee-grounds" appearance.  · Have bleeding that will not stop after applying pressure for 30 minutes such as cuts, nosebleeds, other injuries.  · Have severe pain in your joints or back.  · Have a new and severe headache.  · Have sudden weakness or numbness of your face, arm, or leg, especially on one side of your body.  · Have sudden confusion or trouble understanding.  · Have sudden trouble seeing in one or both eyes.  · Have sudden trouble walking, dizziness, loss of balance, or coordination.  · Have aphasia.  Document Released: 11/30/2005 Document Revised: 08/24/2012 Document Reviewed: 05/26/2013  [employer]® Patient Information ©2014 [employer].

## 2014-03-14 NOTE — Progress Notes (Signed)
CC: Janet Joyce is a 58 y.o. female is here for discuss coumadin   Subjective: HPI:  Patient expresses regarding recent Coumadin recommendations with recent INR values. She's worried about current recommendation of increasing Coumadin after her INR decreased from 1.7-1.3. She believes that her blood is currently "too thin" and she's worried that she is approaching the need for hospitalization when this happened years ago when her INR was apparently 20. She states that she is experiencing the sensation due to cold intolerance and fatigue. She denies any bleeding or increased bruisability. She is under the impression that "thin blood" is reflected by a low INR and that the more Coumadin in individual takes the thicker his or her blood becomes, the more Coumadin a individual takes the lower his or her INR.  She denies shortness of breath, chest pain, abdominal pain, motor sensory disturbances, nor irregular heartbeat. She's currently using Coumadin 2 mg tabs taking 1/2 tab daily but 1 full  on Monday.   Review Of Systems Outlined In HPI  Past Medical History  Diagnosis Date  . Heart attack   . Hyperlipidemia   . Depression     No past surgical history on file. Family History  Problem Relation Age of Onset  . Diabetes Father     History   Social History  . Marital Status: Unknown    Spouse Name: N/A    Number of Children: N/A  . Years of Education: N/A   Occupational History  . Not on file.   Social History Main Topics  . Smoking status: Current Every Day Smoker -- 0.50 packs/day    Types: Cigarettes  . Smokeless tobacco: Not on file  . Alcohol Use: No  . Drug Use: No  . Sexual Activity: No   Other Topics Concern  . Not on file   Social History Narrative  . No narrative on file     Objective: BP 128/82  Pulse 71  Wt 139 lb (63.05 kg)  General: Alert and Oriented, No Acute Distress HEENT: Pupils equal, round, reactive to light. Conjunctivae clear.  Moist  membranes pharynx unremarkable Lungs: Clear to auscultation bilaterally, no wheezing/ronchi/rales.  Comfortable work of breathing. Good air movement. Cardiac: Regular rate and rhythm. Normal S1/. mechanical S2    Extremities: No peripheral edema.  Strong peripheral pulses.  Mental Status: No depression, anxiety, nor agitation. Skin: Warm and dry.  Assessment & Plan: Milee was seen today for discuss coumadin.  Diagnoses and associated orders for this visit:  H/O aortic valve replacement - INR/PT  Atrial fibrillation - INR/PT  Aortic valve replaced - warfarin (COUMADIN) 2 MG tablet; Half a tablet daily.  Fatigue - CBC     atrial fibrillation and aortic valve replacement requiring chronic anticoagulation: Uncontrolled, subtherapeutic at last check. It appears that for the past years her understanding of how Coumadin impacts INR is the opposite of what I've been recommending.  She seems skeptical about my logic but agrees to follow recommendations however I strongly encouraged her to seek a second opinion with her cardiologist to help boost her confidence. I like to check an INR today to help [place] with Coumadin recommendations tomorrow morning  With regard to her fatigue checking hemoglobin  25 minutes spent face-to-face during visit today of which at least 50% was counseling or coordinating care regarding: 1. H/O aortic valve replacement   2. Atrial fibrillation   3. Aortic valve replaced   4. Fatigue      Return if symptoms worsen  or fail to improve.

## 2014-03-15 LAB — PROTIME-INR
INR: 1.72 — ABNORMAL HIGH (ref <1.50)
PROTHROMBIN TIME: 19.8 s — AB (ref 11.6–15.2)

## 2014-03-23 ENCOUNTER — Ambulatory Visit: Payer: Medicare Other | Admitting: *Deleted

## 2014-03-23 ENCOUNTER — Other Ambulatory Visit: Payer: Self-pay | Admitting: *Deleted

## 2014-03-23 ENCOUNTER — Telehealth: Payer: Self-pay | Admitting: *Deleted

## 2014-03-23 DIAGNOSIS — M549 Dorsalgia, unspecified: Principal | ICD-10-CM

## 2014-03-23 DIAGNOSIS — Z954 Presence of other heart-valve replacement: Secondary | ICD-10-CM | POA: Diagnosis not present

## 2014-03-23 DIAGNOSIS — G8929 Other chronic pain: Secondary | ICD-10-CM

## 2014-03-23 DIAGNOSIS — Z952 Presence of prosthetic heart valve: Secondary | ICD-10-CM

## 2014-03-23 MED ORDER — OXYCODONE-ACETAMINOPHEN 10-325 MG PO TABS: 1.0000 | ORAL_TABLET | Freq: Four times a day (QID) | ORAL | Status: DC | PRN

## 2014-03-23 NOTE — Telephone Encounter (Signed)
Labs entered.

## 2014-03-24 LAB — PROTIME-INR
INR: 1.75 — AB (ref <1.50)
PROTHROMBIN TIME: 20.1 s — AB (ref 11.6–15.2)

## 2014-03-28 ENCOUNTER — Telehealth: Payer: Self-pay | Admitting: *Deleted

## 2014-03-28 DIAGNOSIS — I4891 Unspecified atrial fibrillation: Secondary | ICD-10-CM

## 2014-03-28 NOTE — Telephone Encounter (Signed)
Order placed

## 2014-03-29 ENCOUNTER — Ambulatory Visit: Payer: Medicare Other

## 2014-03-29 LAB — PROTIME-INR
INR: 1.75 — AB (ref <1.50)
PROTHROMBIN TIME: 20.1 s — AB (ref 11.6–15.2)

## 2014-04-03 ENCOUNTER — Other Ambulatory Visit: Payer: Self-pay | Admitting: *Deleted

## 2014-04-03 ENCOUNTER — Telehealth: Payer: Self-pay | Admitting: Family Medicine

## 2014-04-03 DIAGNOSIS — E876 Hypokalemia: Secondary | ICD-10-CM

## 2014-04-03 MED ORDER — TRAMADOL HCL 50 MG PO TABS: ORAL_TABLET | ORAL | Status: DC

## 2014-04-03 MED ORDER — POTASSIUM CHLORIDE CRYS ER 20 MEQ PO TBCR: 40.0000 meq | EXTENDED_RELEASE_TABLET | Freq: Every day | ORAL | Status: DC

## 2014-04-03 NOTE — Telephone Encounter (Signed)
faxed

## 2014-04-03 NOTE — Telephone Encounter (Signed)
Janet Joyce, Rx placed in in-box ready for pickup/faxing.  

## 2014-04-04 ENCOUNTER — Telehealth: Payer: Self-pay | Admitting: *Deleted

## 2014-04-04 DIAGNOSIS — Z954 Presence of other heart-valve replacement: Secondary | ICD-10-CM | POA: Diagnosis not present

## 2014-04-04 DIAGNOSIS — Z952 Presence of prosthetic heart valve: Secondary | ICD-10-CM

## 2014-04-04 NOTE — Telephone Encounter (Signed)
labs

## 2014-04-06 LAB — PROTIME-INR
INR: 1.35 (ref <1.50)
Prothrombin Time: 16.5 seconds — ABNORMAL HIGH (ref 11.6–15.2)

## 2014-04-12 ENCOUNTER — Telehealth: Payer: Self-pay

## 2014-04-12 DIAGNOSIS — I4891 Unspecified atrial fibrillation: Secondary | ICD-10-CM | POA: Diagnosis not present

## 2014-04-12 NOTE — Telephone Encounter (Signed)
Patient request order for INR/PT. It was ordered and faxed downstairs to lab. Rhonda Cunningham,CMA

## 2014-04-13 ENCOUNTER — Telehealth: Payer: Self-pay | Admitting: Family Medicine

## 2014-04-13 LAB — PROTIME-INR
INR: 1.37 (ref <1.50)
PROTHROMBIN TIME: 16.7 s — AB (ref 11.6–15.2)

## 2014-04-13 MED ORDER — WARFARIN SODIUM 3 MG PO TABS: ORAL_TABLET | ORAL | Status: DC

## 2014-04-13 NOTE — Telephone Encounter (Signed)
Pt.notified

## 2014-04-13 NOTE — Telephone Encounter (Signed)
Janet Joyce, Will you please let patient know that INR was 1.37, no real change from last week.  In order to thin her blood and increase INR I'd recommend she alternate between taking a 2mg  and 3mg  coumadin tab every other day.  I will send an Rx for 3mg  tabs to her pharmacy.  Recheck one week.

## 2014-04-17 ENCOUNTER — Telehealth: Payer: Self-pay | Admitting: Family Medicine

## 2014-04-17 DIAGNOSIS — I4891 Unspecified atrial fibrillation: Secondary | ICD-10-CM

## 2014-04-17 NOTE — Telephone Encounter (Signed)
Janet Joyce, Order printed on printer tray.

## 2014-04-17 NOTE — Telephone Encounter (Signed)
faxed

## 2014-04-20 ENCOUNTER — Ambulatory Visit (INDEPENDENT_AMBULATORY_CARE_PROVIDER_SITE_OTHER): Payer: Medicare Other | Admitting: Family Medicine

## 2014-04-20 ENCOUNTER — Encounter: Payer: Self-pay | Admitting: Family Medicine

## 2014-04-20 VITALS — BP 104/69 | HR 76 | Wt 144.0 lb

## 2014-04-20 DIAGNOSIS — G8929 Other chronic pain: Secondary | ICD-10-CM

## 2014-04-20 DIAGNOSIS — I4891 Unspecified atrial fibrillation: Secondary | ICD-10-CM | POA: Diagnosis not present

## 2014-04-20 DIAGNOSIS — I10 Essential (primary) hypertension: Secondary | ICD-10-CM | POA: Diagnosis not present

## 2014-04-20 DIAGNOSIS — M549 Dorsalgia, unspecified: Secondary | ICD-10-CM

## 2014-04-20 DIAGNOSIS — Z952 Presence of prosthetic heart valve: Secondary | ICD-10-CM

## 2014-04-20 DIAGNOSIS — Z954 Presence of other heart-valve replacement: Secondary | ICD-10-CM | POA: Diagnosis not present

## 2014-04-20 MED ORDER — OXYCODONE-ACETAMINOPHEN 10-325 MG PO TABS: 1.0000 | ORAL_TABLET | Freq: Four times a day (QID) | ORAL | Status: DC | PRN

## 2014-04-20 NOTE — Progress Notes (Signed)
CC: Janet Joyce is a 58 y.o. female is here for refill pain meds   Subjective: HPI:  Shares the sad news that her mother passed yesterday.  Followup essential hypertension: Continues on carvedilol on a daily basis with no outside blood pressures to report. Denies chest pain shortness of breath orthopnea nor peripheral edema  Follow chronic back pain: Denies change in character, severity, frequency of chronic back pain localized in the low back. Worse when lifting large objects. Improves with lying down. She plans on getting back into her physical therapy now that she'll have more time not indicated for her mother. Denies saddle paresthesia nor bowel or bladder incontinence  Followup atrial fibrillation with chronic anticoagulation: Taking 2 mg and 3 mg of Coumadin alternating on every other day basis. Denies bleeding, bruising, nor motor or sensory disturbances.  Review Of Systems Outlined In HPI  Past Medical History  Diagnosis Date  . Heart attack   . Hyperlipidemia   . Depression     No past surgical history on file. Family History  Problem Relation Age of Onset  . Diabetes Father     History   Social History  . Marital Status: Unknown    Spouse Name: N/A    Number of Children: N/A  . Years of Education: N/A   Occupational History  . Not on file.   Social History Main Topics  . Smoking status: Current Every Day Smoker -- 0.50 packs/day    Types: Cigarettes  . Smokeless tobacco: Not on file  . Alcohol Use: No  . Drug Use: No  . Sexual Activity: No   Other Topics Concern  . Not on file   Social History Narrative  . No narrative on file     Objective: BP 104/69  Pulse 76  Wt 144 lb (65.318 kg)  General: Alert and Oriented, No Acute Distress HEENT: Pupils equal, round, reactive to light. Conjunctivae clear.  Moist membranes pharynx unremarkable Lungs: Clear to auscultation bilaterally, no wheezing/ronchi/rales.  Comfortable work of breathing. Good air  movement. Cardiac: Regular rate and rhythm. Normal S1/S2.  No murmurs, rubs, nor gallops.   Extremities: No peripheral edema.  Strong peripheral pulses.  Mental Status: No depression, anxiety, nor agitation. Skin: Warm and dry.  Assessment & Plan: Janet Joyce was seen today for refill pain meds.  Diagnoses and associated orders for this visit:  Chronic back pain - oxyCODONE-acetaminophen (PERCOCET) 10-325 MG per tablet; Take 1 tablet by mouth every 6 (six) hours as needed for pain.  Essential hypertension, benign  Atrial fibrillation  H/O aortic valve replacement    Chronic back pain: Continue as needed Percocet Essential hypertension: Controlled continue lisinopril Atrial fibrillation on chronic anticoagulation with aortic valve replacement: Stable however INR has been subtherapeutic and she is due for a repeat INR today  Return in about 1 week (around 04/27/2014).

## 2014-04-21 LAB — PROTIME-INR
INR: 2.38 — ABNORMAL HIGH (ref <1.50)
Prothrombin Time: 25.4 seconds — ABNORMAL HIGH (ref 11.6–15.2)

## 2014-04-24 ENCOUNTER — Telehealth: Payer: Self-pay | Admitting: Family Medicine

## 2014-04-24 DIAGNOSIS — I4891 Unspecified atrial fibrillation: Secondary | ICD-10-CM

## 2014-04-24 DIAGNOSIS — Z954 Presence of other heart-valve replacement: Secondary | ICD-10-CM | POA: Diagnosis not present

## 2014-04-24 DIAGNOSIS — Z952 Presence of prosthetic heart valve: Secondary | ICD-10-CM

## 2014-04-24 NOTE — Telephone Encounter (Signed)
Pt called. She would like Order sent down to lab on Thursday.  Thank you.

## 2014-04-24 NOTE — Telephone Encounter (Signed)
Im assuming a PT/INR?

## 2014-04-27 ENCOUNTER — Telehealth: Payer: Self-pay | Admitting: Family Medicine

## 2014-04-27 DIAGNOSIS — Z952 Presence of prosthetic heart valve: Secondary | ICD-10-CM

## 2014-04-27 DIAGNOSIS — I4891 Unspecified atrial fibrillation: Secondary | ICD-10-CM

## 2014-04-27 LAB — PROTIME-INR
INR: 1.34 (ref <1.50)
Prothrombin Time: 16.4 seconds — ABNORMAL HIGH (ref 11.6–15.2)

## 2014-04-27 NOTE — Telephone Encounter (Signed)
INR

## 2014-05-03 DIAGNOSIS — I4891 Unspecified atrial fibrillation: Secondary | ICD-10-CM | POA: Diagnosis not present

## 2014-05-03 DIAGNOSIS — Z954 Presence of other heart-valve replacement: Secondary | ICD-10-CM | POA: Diagnosis not present

## 2014-05-04 LAB — PROTIME-INR
INR: 2.64 — ABNORMAL HIGH (ref <1.50)
Prothrombin Time: 27.5 seconds — ABNORMAL HIGH (ref 11.6–15.2)

## 2014-05-10 ENCOUNTER — Telehealth: Payer: Self-pay | Admitting: *Deleted

## 2014-05-10 DIAGNOSIS — I4891 Unspecified atrial fibrillation: Secondary | ICD-10-CM

## 2014-05-10 NOTE — Telephone Encounter (Signed)
PT/INR ordered & faxed to [facility].

## 2014-05-11 ENCOUNTER — Telehealth: Payer: Self-pay | Admitting: Family Medicine

## 2014-05-11 LAB — PROTIME-INR
INR: 2.28 — AB (ref <1.50)
Prothrombin Time: 24.6 seconds — ABNORMAL HIGH (ref 11.6–15.2)

## 2014-05-11 NOTE — Telephone Encounter (Signed)
Pt called about results from bloodwork.  Thank you.

## 2014-05-13 NOTE — Telephone Encounter (Signed)
Patient aware.

## 2014-05-16 ENCOUNTER — Telehealth: Payer: Self-pay | Admitting: *Deleted

## 2014-05-16 DIAGNOSIS — I4891 Unspecified atrial fibrillation: Secondary | ICD-10-CM | POA: Diagnosis not present

## 2014-05-16 NOTE — Telephone Encounter (Signed)
Labs for INR

## 2014-05-18 ENCOUNTER — Telehealth: Payer: Self-pay | Admitting: Family Medicine

## 2014-05-18 DIAGNOSIS — I482 Chronic atrial fibrillation, unspecified: Secondary | ICD-10-CM

## 2014-05-18 LAB — PROTIME-INR
INR: 1.64 — AB (ref <1.50)
Prothrombin Time: 19.1 seconds — ABNORMAL HIGH (ref 11.6–15.2)

## 2014-05-18 MED ORDER — WARFARIN SODIUM 1 MG PO TABS: ORAL_TABLET | ORAL | Status: DC

## 2014-05-18 NOTE — Telephone Encounter (Signed)
Seth Bake, Will you please let patient know that her INR was 1.6 below our goal of 2.5-3.5.  I'd recommend she take coumadin 3mg  on a daily basis with an additional 1mg  tablet on Monday, Wednesday, and Friday with a repeat INR in one week.  1mg  Rx sent to her pharmacy.

## 2014-05-18 NOTE — Telephone Encounter (Signed)
Pt advised . Bonnie Turci CMA  

## 2014-05-21 ENCOUNTER — Other Ambulatory Visit: Payer: Self-pay | Admitting: *Deleted

## 2014-05-21 MED ORDER — TRAMADOL HCL 50 MG PO TABS: ORAL_TABLET | ORAL | Status: DC

## 2014-05-23 ENCOUNTER — Other Ambulatory Visit: Payer: Self-pay | Admitting: *Deleted

## 2014-05-23 ENCOUNTER — Telehealth: Payer: Self-pay | Admitting: Family Medicine

## 2014-05-23 DIAGNOSIS — G8929 Other chronic pain: Secondary | ICD-10-CM

## 2014-05-23 DIAGNOSIS — Z954 Presence of other heart-valve replacement: Secondary | ICD-10-CM | POA: Diagnosis not present

## 2014-05-23 DIAGNOSIS — Z952 Presence of prosthetic heart valve: Secondary | ICD-10-CM

## 2014-05-23 DIAGNOSIS — I4891 Unspecified atrial fibrillation: Secondary | ICD-10-CM

## 2014-05-23 DIAGNOSIS — M549 Dorsalgia, unspecified: Principal | ICD-10-CM

## 2014-05-23 MED ORDER — OXYCODONE-ACETAMINOPHEN 10-325 MG PO TABS
1.0000 | ORAL_TABLET | Freq: Four times a day (QID) | ORAL | Status: DC | PRN
Start: 2014-05-23 — End: 2014-06-21

## 2014-05-23 NOTE — Telephone Encounter (Signed)
Order placed

## 2014-05-23 NOTE — Telephone Encounter (Signed)
Pt called and would like Order faxed down today. She will be in at 1:00.  Thank you.

## 2014-05-24 LAB — PROTIME-INR
INR: 2.43 — ABNORMAL HIGH (ref <1.50)
PROTHROMBIN TIME: 25.8 s — AB (ref 11.6–15.2)

## 2014-05-31 DIAGNOSIS — I4891 Unspecified atrial fibrillation: Secondary | ICD-10-CM | POA: Diagnosis not present

## 2014-06-01 LAB — PROTIME-INR
INR: 2.13 — AB (ref <1.50)
Prothrombin Time: 23.3 seconds — ABNORMAL HIGH (ref 11.6–15.2)

## 2014-06-04 ENCOUNTER — Telehealth: Payer: Self-pay | Admitting: Family Medicine

## 2014-06-04 ENCOUNTER — Telehealth: Payer: Self-pay | Admitting: *Deleted

## 2014-06-04 DIAGNOSIS — Z952 Presence of prosthetic heart valve: Secondary | ICD-10-CM

## 2014-06-04 NOTE — Telephone Encounter (Signed)
Labs entered.entered standing labs

## 2014-06-04 NOTE — Telephone Encounter (Signed)
Pt called and wants Order sent down for tomorrow 6/23.  Thank you.

## 2014-06-07 ENCOUNTER — Other Ambulatory Visit: Payer: Self-pay

## 2014-06-07 DIAGNOSIS — Z952 Presence of prosthetic heart valve: Secondary | ICD-10-CM

## 2014-06-07 DIAGNOSIS — Z9861 Coronary angioplasty status: Secondary | ICD-10-CM | POA: Diagnosis not present

## 2014-06-07 DIAGNOSIS — Z954 Presence of other heart-valve replacement: Secondary | ICD-10-CM | POA: Diagnosis not present

## 2014-06-07 DIAGNOSIS — J449 Chronic obstructive pulmonary disease, unspecified: Secondary | ICD-10-CM | POA: Diagnosis not present

## 2014-06-07 DIAGNOSIS — Z951 Presence of aortocoronary bypass graft: Secondary | ICD-10-CM | POA: Diagnosis not present

## 2014-06-07 DIAGNOSIS — J4489 Other specified chronic obstructive pulmonary disease: Secondary | ICD-10-CM | POA: Diagnosis not present

## 2014-06-07 DIAGNOSIS — I2510 Atherosclerotic heart disease of native coronary artery without angina pectoris: Secondary | ICD-10-CM | POA: Diagnosis not present

## 2014-06-08 LAB — PROTIME-INR
INR: 2.85 — AB (ref <1.50)
PROTHROMBIN TIME: 29.9 s — AB (ref 11.6–15.2)

## 2014-06-14 DIAGNOSIS — Z954 Presence of other heart-valve replacement: Secondary | ICD-10-CM | POA: Diagnosis not present

## 2014-06-15 LAB — PROTIME-INR
INR: 2.61 — ABNORMAL HIGH (ref <1.50)
Prothrombin Time: 27.9 seconds — ABNORMAL HIGH (ref 11.6–15.2)

## 2014-06-21 ENCOUNTER — Telehealth: Payer: Self-pay

## 2014-06-21 DIAGNOSIS — G8929 Other chronic pain: Secondary | ICD-10-CM

## 2014-06-21 DIAGNOSIS — M549 Dorsalgia, unspecified: Principal | ICD-10-CM

## 2014-06-21 MED ORDER — OXYCODONE-ACETAMINOPHEN 10-325 MG PO TABS: 1.0000 | ORAL_TABLET | Freq: Four times a day (QID) | ORAL | Status: DC | PRN

## 2014-06-21 NOTE — Telephone Encounter (Signed)
Pt informed rx placed up front and ready for pick up./Gomer France,CMA

## 2014-06-21 NOTE — Telephone Encounter (Signed)
Pt called and stated that she would like a refill on her Percocet. She was last seen on 04/20/2014 for chronic back pain and it looks like you want to see her in 1 week for a follow up. Please advise if can refill./Sissi Padia,CMA

## 2014-06-21 NOTE — Telephone Encounter (Signed)
Janet Joyce, Rx placed in in-box ready for pickup/faxing.  (FU was for INR)

## 2014-07-03 NOTE — Telephone Encounter (Signed)
error 

## 2014-07-05 DIAGNOSIS — Z954 Presence of other heart-valve replacement: Secondary | ICD-10-CM | POA: Diagnosis not present

## 2014-07-06 LAB — PROTIME-INR
INR: 3.73 — ABNORMAL HIGH (ref <1.50)
Prothrombin Time: 36.9 seconds — ABNORMAL HIGH (ref 11.6–15.2)

## 2014-07-11 DIAGNOSIS — Z954 Presence of other heart-valve replacement: Secondary | ICD-10-CM | POA: Diagnosis not present

## 2014-07-12 LAB — PROTIME-INR
INR: 3.96 — ABNORMAL HIGH (ref <1.50)
Prothrombin Time: 38.7 seconds — ABNORMAL HIGH (ref 11.6–15.2)

## 2014-07-13 ENCOUNTER — Other Ambulatory Visit: Payer: Self-pay | Admitting: Family Medicine

## 2014-07-16 ENCOUNTER — Telehealth: Payer: Self-pay | Admitting: Family Medicine

## 2014-07-16 MED ORDER — TRAMADOL HCL 50 MG PO TABS: ORAL_TABLET | ORAL | Status: DC

## 2014-07-16 NOTE — Telephone Encounter (Signed)
Janet Joyce, Rx placed in in-box ready for pickup/faxing.  

## 2014-07-16 NOTE — Telephone Encounter (Signed)
faxed

## 2014-07-18 DIAGNOSIS — Z954 Presence of other heart-valve replacement: Secondary | ICD-10-CM | POA: Diagnosis not present

## 2014-07-19 LAB — PROTIME-INR
INR: 2.64 — AB (ref <1.50)
Prothrombin Time: 28.2 seconds — ABNORMAL HIGH (ref 11.6–15.2)

## 2014-07-23 ENCOUNTER — Telehealth: Payer: Self-pay

## 2014-07-23 ENCOUNTER — Telehealth: Payer: Self-pay | Admitting: *Deleted

## 2014-07-23 DIAGNOSIS — G8929 Other chronic pain: Secondary | ICD-10-CM

## 2014-07-23 DIAGNOSIS — M549 Dorsalgia, unspecified: Principal | ICD-10-CM

## 2014-07-23 MED ORDER — OXYCODONE-ACETAMINOPHEN 10-325 MG PO TABS: 1.0000 | ORAL_TABLET | Freq: Four times a day (QID) | ORAL | Status: DC | PRN

## 2014-07-23 NOTE — Telephone Encounter (Signed)
Patient notified that her script was at the front desk. Margette Fast, CMA

## 2014-07-23 NOTE — Telephone Encounter (Signed)
rx placed up front by [facility]

## 2014-07-23 NOTE — Telephone Encounter (Signed)
Ms. Roussin called wanting a refill on her Percocet 10/325 mg. Is it ok to refill it. Please advise. Josiah Lobo, CMA

## 2014-07-23 NOTE — Telephone Encounter (Signed)
Andrea, Rx placed in in-box ready for pickup/faxing.  

## 2014-07-26 DIAGNOSIS — Z954 Presence of other heart-valve replacement: Secondary | ICD-10-CM | POA: Diagnosis not present

## 2014-07-26 DIAGNOSIS — Z1231 Encounter for screening mammogram for malignant neoplasm of breast: Secondary | ICD-10-CM | POA: Diagnosis not present

## 2014-07-26 LAB — HM MAMMOGRAPHY

## 2014-07-27 LAB — PROTIME-INR
INR: 2.81 — ABNORMAL HIGH (ref <1.50)
PROTHROMBIN TIME: 29.6 s — AB (ref 11.6–15.2)

## 2014-08-06 ENCOUNTER — Other Ambulatory Visit: Payer: Self-pay | Admitting: Family Medicine

## 2014-08-17 ENCOUNTER — Other Ambulatory Visit: Payer: Self-pay | Admitting: Family Medicine

## 2014-08-17 ENCOUNTER — Encounter: Payer: Self-pay | Admitting: Family Medicine

## 2014-08-17 ENCOUNTER — Ambulatory Visit (INDEPENDENT_AMBULATORY_CARE_PROVIDER_SITE_OTHER): Payer: Medicare Other | Admitting: Family Medicine

## 2014-08-17 VITALS — BP 114/61 | HR 72 | Temp 97.7°F | Wt 156.0 lb

## 2014-08-17 DIAGNOSIS — J208 Acute bronchitis due to other specified organisms: Secondary | ICD-10-CM

## 2014-08-17 DIAGNOSIS — Z5181 Encounter for therapeutic drug level monitoring: Secondary | ICD-10-CM | POA: Diagnosis not present

## 2014-08-17 DIAGNOSIS — R7309 Other abnormal glucose: Secondary | ICD-10-CM | POA: Diagnosis not present

## 2014-08-17 DIAGNOSIS — E785 Hyperlipidemia, unspecified: Secondary | ICD-10-CM | POA: Diagnosis not present

## 2014-08-17 DIAGNOSIS — I4891 Unspecified atrial fibrillation: Secondary | ICD-10-CM

## 2014-08-17 DIAGNOSIS — Z79899 Other long term (current) drug therapy: Secondary | ICD-10-CM

## 2014-08-17 DIAGNOSIS — R7303 Prediabetes: Secondary | ICD-10-CM

## 2014-08-17 DIAGNOSIS — I482 Chronic atrial fibrillation, unspecified: Secondary | ICD-10-CM

## 2014-08-17 DIAGNOSIS — J209 Acute bronchitis, unspecified: Secondary | ICD-10-CM

## 2014-08-17 DIAGNOSIS — A499 Bacterial infection, unspecified: Secondary | ICD-10-CM

## 2014-08-17 DIAGNOSIS — B9689 Other specified bacterial agents as the cause of diseases classified elsewhere: Secondary | ICD-10-CM

## 2014-08-17 MED ORDER — AMOXICILLIN 500 MG PO CAPS: 500.0000 mg | ORAL_CAPSULE | Freq: Three times a day (TID) | ORAL | Status: DC

## 2014-08-17 MED ORDER — HYDROCODONE-HOMATROPINE 5-1.5 MG/5ML PO SYRP: 5.0000 mL | ORAL_SOLUTION | Freq: Three times a day (TID) | ORAL | Status: DC | PRN

## 2014-08-17 NOTE — Progress Notes (Signed)
CC: Janet Joyce is a 58 y.o. female is here for URI   Subjective: HPI:  Complains of productive cough with shortness of breath has been present for the past week worsening a daily basis. Originally accompanied by sinus pressure but that has now resolved. Symptoms are present all hours of the day but most problematic at night. Significantly interfere with sleep. Symptoms overall described as moderate in severity. No interventions as of yet. Accompanied by subjective fevers but no chills. Denies wheezing, chest pain, irregular heartbeat, motor sensory disturbances, confusion nor rashes.  Followup hyperlipidemia: Continues to take Lipitor on a daily basis without known side effects.  No right upper quadrant pain, limb claudication or myalgias  Followup for diabetes: Currently controlling with diet alone. No formal exercise routine. Denies polyuria polyphasia or polydipsia.  Followup atrial fibrillation: Continues to take Coumadin on a daily basis without bruising, nor bleeding abnormalities. Denies peripheral edema but does endorse shortness of breath since her sickness above.   Review Of Systems Outlined In HPI  Past Medical History  Diagnosis Date  . Heart attack   . Hyperlipidemia   . Depression     No past surgical history on file. Family History  Problem Relation Age of Onset  . Diabetes Father     History   Social History  . Marital Status: Unknown    Spouse Name: N/A    Number of Children: N/A  . Years of Education: N/A   Occupational History  . Not on file.   Social History Main Topics  . Smoking status: Current Every Day Smoker -- 0.50 packs/day    Types: Cigarettes  . Smokeless tobacco: Not on file  . Alcohol Use: No  . Drug Use: No  . Sexual Activity: No   Other Topics Concern  . Not on file   Social History Narrative  . No narrative on file     Objective: BP 114/61  Pulse 72  Temp(Src) 97.7 F (36.5 C) (Oral)  Wt 156 lb (70.761 kg)  General:  Alert and Oriented, No Acute Distress HEENT: Pupils equal, round, reactive to light. Conjunctivae clear.  External ears unremarkable, canals clear with intact TMs with appropriate landmarks.  Middle ear appears open without effusion. Pink inferior turbinates.  Moist mucous membranes, pharynx without inflammation nor lesions.  Neck supple without palpable lymphadenopathy nor abnormal masses. Lungs: Comfortable work of breathing with trace central rhonchi no rales nor wheezing. Frequent coughing during encounter. Cardiac: Regular rate and rhythm. Mechanical S2 no murmurs or gallops. Extremities: No peripheral edema.  Strong peripheral pulses.  Mental Status: No depression, anxiety, nor agitation. Skin: Warm and dry.  Assessment & Plan: Janet Joyce was seen today for uri.  Diagnoses and associated orders for this visit:  Chronic atrial fibrillation - INR/PT - CBC  Hyperlipidemia - Lipid panel - COMPLETE METABOLIC PANEL WITH GFR  Prediabetes - COMPLETE METABOLIC PANEL WITH GFR  Encounter for monitoring statin therapy - COMPLETE METABOLIC PANEL WITH GFR  Acute bacterial bronchitis - amoxicillin (AMOXIL) 500 MG capsule; Take 1 capsule (500 mg total) by mouth 3 (three) times daily. - HYDROcodone-homatropine (HYCODAN) 5-1.5 MG/5ML syrup; Take 5 mLs by mouth every 8 (eight) hours as needed for cough.    Atrial fibrillation: Controlled continue carvedilol and Coumadin, she's due for an INR next week. Hyperlipidemia: Due for lipid panel continue Lipitor pending results. Checking liver enzymes Prediabetes: Checking fasting blood sugar Acute bacterial bronchitis: I recommended Augmentin however she believes she's had an intolerance to this in  the past and would prefer only the amoxicillin component. I've asked her to call me on Tuesday if no better. Hycodan as needed help with sleep  Reminder to fax results to Janet Joyce as an Janet Joyce   Return if symptoms worsen or fail to improve.

## 2014-08-21 ENCOUNTER — Telehealth: Payer: Self-pay | Admitting: Family Medicine

## 2014-08-21 MED ORDER — ALBUTEROL SULFATE HFA 108 (90 BASE) MCG/ACT IN AERS: INHALATION_SPRAY | RESPIRATORY_TRACT | Status: DC

## 2014-08-21 NOTE — Telephone Encounter (Signed)
Pt.notified

## 2014-08-21 NOTE — Telephone Encounter (Signed)
Janet Joyce called. Congestion is in her chest. She  wants  script for  Inhaler.. Thank you

## 2014-08-21 NOTE — Telephone Encounter (Signed)
Rx sent to [facility] on file

## 2014-08-22 ENCOUNTER — Telehealth: Payer: Self-pay | Admitting: Family Medicine

## 2014-08-22 DIAGNOSIS — G8929 Other chronic pain: Secondary | ICD-10-CM

## 2014-08-22 DIAGNOSIS — M549 Dorsalgia, unspecified: Principal | ICD-10-CM

## 2014-08-22 MED ORDER — OXYCODONE-ACETAMINOPHEN 10-325 MG PO TABS: 1.0000 | ORAL_TABLET | Freq: Four times a day (QID) | ORAL | Status: DC | PRN

## 2014-08-22 NOTE — Telephone Encounter (Signed)
Ms. Kintzel called. She wants refill on pain meds.  Thank you

## 2014-08-22 NOTE — Telephone Encounter (Signed)
Janet Joyce, Rx placed in in-box ready for pickup/faxing.  

## 2014-08-23 NOTE — Telephone Encounter (Signed)
rx up front 

## 2014-08-24 ENCOUNTER — Telehealth: Payer: Self-pay

## 2014-08-24 ENCOUNTER — Ambulatory Visit (INDEPENDENT_AMBULATORY_CARE_PROVIDER_SITE_OTHER): Payer: Medicare Other | Admitting: Sports Medicine

## 2014-08-24 ENCOUNTER — Encounter: Payer: Self-pay | Admitting: Sports Medicine

## 2014-08-24 ENCOUNTER — Ambulatory Visit (INDEPENDENT_AMBULATORY_CARE_PROVIDER_SITE_OTHER): Payer: Medicare Other

## 2014-08-24 VITALS — BP 117/69 | HR 81 | Temp 98.1°F | Ht 63.0 in | Wt 155.0 lb

## 2014-08-24 DIAGNOSIS — J449 Chronic obstructive pulmonary disease, unspecified: Secondary | ICD-10-CM

## 2014-08-24 DIAGNOSIS — J4489 Other specified chronic obstructive pulmonary disease: Secondary | ICD-10-CM | POA: Diagnosis not present

## 2014-08-24 DIAGNOSIS — R059 Cough, unspecified: Secondary | ICD-10-CM

## 2014-08-24 DIAGNOSIS — J441 Chronic obstructive pulmonary disease with (acute) exacerbation: Secondary | ICD-10-CM | POA: Insufficient documentation

## 2014-08-24 DIAGNOSIS — R05 Cough: Secondary | ICD-10-CM

## 2014-08-24 DIAGNOSIS — J90 Pleural effusion, not elsewhere classified: Secondary | ICD-10-CM | POA: Diagnosis not present

## 2014-08-24 DIAGNOSIS — R0602 Shortness of breath: Secondary | ICD-10-CM

## 2014-08-24 DIAGNOSIS — R062 Wheezing: Secondary | ICD-10-CM

## 2014-08-24 MED ORDER — PREDNISONE 50 MG PO TABS
50.0000 mg | ORAL_TABLET | Freq: Every day | ORAL | Status: DC
Start: 2014-08-24 — End: 2014-09-28

## 2014-08-24 MED ORDER — AZITHROMYCIN 250 MG PO TABS: ORAL_TABLET | ORAL | Status: DC

## 2014-08-24 MED ORDER — IPRATROPIUM-ALBUTEROL 20-100 MCG/ACT IN AERS: 1.0000 | INHALATION_SPRAY | Freq: Four times a day (QID) | RESPIRATORY_TRACT | Status: DC

## 2014-08-24 NOTE — Progress Notes (Signed)
  Subjective:    CC: Increasing cough  HPI: This is a pleasant 58 year old female, she has smoked for decades currently smokes approximately one pack per day, for the past several days she's had increasing cough, shortness of breath, wheezing, she was treated appropriately with a beta-lactam antibiotic but unfortunately has persistent symptoms. No fevers or chills, mild shortness of breath. No chest pain. Symptoms are moderate, persistent.  Past medical history, Surgical history, Family history not pertinant except as noted below, Social history, Allergies, and medications have been entered into the medical record, reviewed, and no changes needed.   Review of Systems: No fevers, chills, night sweats, weight loss, chest pain, or shortness of breath.   Objective:    General: Well Developed, well nourished, and in no acute distress.  Neuro: Alert and oriented x3, extra-ocular muscles intact, sensation grossly intact.  HEENT: Normocephalic, atraumatic, pupils equal round reactive to light, neck supple, no masses, no lymphadenopathy, thyroid nonpalpable.  Skin: Warm and dry, no rashes. Cardiac: Regular rate and rhythm, no murmurs rubs or gallops, no lower extremity edema.  Respiratory: Coarse sounds bilaterally with prominent expiratory wheezes.. Not using accessory muscles, speaking in full sentences.  Chest x-ray shows what appears to be scarring in the right base.  Impression and Recommendations:

## 2014-08-24 NOTE — Telephone Encounter (Signed)
If she sounds out of breath it's best to be seen in case she's developing a pneumonia, I'd advise keeping the appt with Dr. Darene Lamer

## 2014-08-24 NOTE — Telephone Encounter (Signed)
See other telephone note.  

## 2014-08-24 NOTE — Telephone Encounter (Signed)
Pt.notified

## 2014-08-24 NOTE — Telephone Encounter (Signed)
Janet Joyce called and reported not feeling any better. She still has shortness of breath, coughing and wheezing. She now has cracked lips, mouth sores and her tongue is red. She sounded out of breath during the conversation. Denies fever, chills or sweats. I advised her she would have to come in to follow up due to the fact it has been sick for a week with no improvement and additional symptoms. She has been scheduled with Dr Dianah Field at 3:45 today. She wanted Dr Ileene Rubens to call her in a different medication/antibiotic, such as Z-pack, instead of coming in.

## 2014-08-24 NOTE — Assessment & Plan Note (Signed)
This is likely early COPD considering smoking history and increasing exacerbations. Prednisone, azithromycin, Combivent sample given. Chest x-ray. Return in 2 weeks for pre-and postbronchodilator spirometry with PCP.

## 2014-08-27 ENCOUNTER — Telehealth: Payer: Self-pay | Admitting: Family Medicine

## 2014-08-27 DIAGNOSIS — R9389 Abnormal findings on diagnostic imaging of other specified body structures: Secondary | ICD-10-CM

## 2014-08-27 NOTE — Telephone Encounter (Signed)
Seth Bake, Will you please let patient know that her xray from Friday showed what looks to be either fluid or scar tissue at the base of her lungs.  The radiologist has recommend a non-emergent CT scan for further evaluation, I've placed an order for this to be performed downstairs.  Please let me know if not contacted about this by the middle of the week.

## 2014-08-27 NOTE — Telephone Encounter (Signed)
Pt.notified

## 2014-08-28 DIAGNOSIS — E785 Hyperlipidemia, unspecified: Secondary | ICD-10-CM | POA: Diagnosis not present

## 2014-08-28 DIAGNOSIS — R7309 Other abnormal glucose: Secondary | ICD-10-CM | POA: Diagnosis not present

## 2014-08-28 DIAGNOSIS — I4891 Unspecified atrial fibrillation: Secondary | ICD-10-CM | POA: Diagnosis not present

## 2014-08-28 DIAGNOSIS — Z79899 Other long term (current) drug therapy: Secondary | ICD-10-CM | POA: Diagnosis not present

## 2014-08-28 DIAGNOSIS — Z5181 Encounter for therapeutic drug level monitoring: Secondary | ICD-10-CM | POA: Diagnosis not present

## 2014-08-28 LAB — COMPLETE METABOLIC PANEL WITH GFR
ALK PHOS: 93 U/L (ref 39–117)
ALT: 24 U/L (ref 0–35)
AST: 28 U/L (ref 0–37)
Albumin: 4 g/dL (ref 3.5–5.2)
BILIRUBIN TOTAL: 0.4 mg/dL (ref 0.2–1.2)
BUN: 10 mg/dL (ref 6–23)
CO2: 27 mEq/L (ref 19–32)
Calcium: 9.3 mg/dL (ref 8.4–10.5)
Chloride: 106 mEq/L (ref 96–112)
Creat: 0.73 mg/dL (ref 0.50–1.10)
GFR, Est African American: >89 mL/min
GLUCOSE: 94 mg/dL (ref 70–99)
Potassium: 3.8 mEq/L (ref 3.5–5.3)
SODIUM: 140 meq/L (ref 135–145)
TOTAL PROTEIN: 6.5 g/dL (ref 6.0–8.3)

## 2014-08-28 LAB — LIPID PANEL
Cholesterol: 115 mg/dL (ref 0–200)
HDL: 40 mg/dL (ref >39)
LDL CALC: 54 mg/dL (ref 0–99)
TRIGLYCERIDES: 107 mg/dL (ref <150)
Total CHOL/HDL Ratio: 2.9 Ratio
VLDL: 21 mg/dL (ref 0–40)

## 2014-08-28 LAB — CBC
HCT: 40.3 % (ref 36.0–46.0)
HEMOGLOBIN: 13.3 g/dL (ref 12.0–15.0)
MCH: 28.4 pg (ref 26.0–34.0)
MCHC: 33 g/dL (ref 30.0–36.0)
MCV: 85.9 fL (ref 78.0–100.0)
Platelets: 327 10*3/uL (ref 150–400)
RBC: 4.69 MIL/uL (ref 3.87–5.11)
RDW: 14.6 % (ref 11.5–15.5)
WBC: 9.4 10*3/uL (ref 4.0–10.5)

## 2014-08-29 ENCOUNTER — Ambulatory Visit (INDEPENDENT_AMBULATORY_CARE_PROVIDER_SITE_OTHER): Payer: Medicare Other | Admitting: *Deleted

## 2014-08-29 DIAGNOSIS — I482 Chronic atrial fibrillation, unspecified: Secondary | ICD-10-CM

## 2014-08-29 DIAGNOSIS — Z951 Presence of aortocoronary bypass graft: Secondary | ICD-10-CM

## 2014-08-29 DIAGNOSIS — Z954 Presence of other heart-valve replacement: Secondary | ICD-10-CM

## 2014-08-29 DIAGNOSIS — Z952 Presence of prosthetic heart valve: Secondary | ICD-10-CM

## 2014-08-29 DIAGNOSIS — I4891 Unspecified atrial fibrillation: Secondary | ICD-10-CM

## 2014-08-29 LAB — PROTIME-INR
INR: 5.94 (ref <1.50)
PROTHROMBIN TIME: 53.1 s — AB (ref 11.6–15.2)

## 2014-08-29 LAB — POCT INR: INR: 5.94

## 2014-08-30 DIAGNOSIS — Z01419 Encounter for gynecological examination (general) (routine) without abnormal findings: Secondary | ICD-10-CM | POA: Diagnosis not present

## 2014-08-30 DIAGNOSIS — Z124 Encounter for screening for malignant neoplasm of cervix: Secondary | ICD-10-CM | POA: Diagnosis not present

## 2014-09-01 ENCOUNTER — Other Ambulatory Visit: Payer: Self-pay | Admitting: Family Medicine

## 2014-09-02 ENCOUNTER — Ambulatory Visit (HOSPITAL_BASED_OUTPATIENT_CLINIC_OR_DEPARTMENT_OTHER)
Admission: RE | Admit: 2014-09-02 | Discharge: 2014-09-02 | Disposition: A | Discharge status: Home / Self Care | Payer: Medicare Other | Source: Ambulatory Visit | Attending: Family Medicine | Admitting: Family Medicine

## 2014-09-02 DIAGNOSIS — R079 Chest pain, unspecified: Secondary | ICD-10-CM | POA: Diagnosis not present

## 2014-09-02 DIAGNOSIS — R918 Other nonspecific abnormal finding of lung field: Secondary | ICD-10-CM | POA: Insufficient documentation

## 2014-09-02 DIAGNOSIS — J438 Other emphysema: Secondary | ICD-10-CM | POA: Diagnosis not present

## 2014-09-02 DIAGNOSIS — R9389 Abnormal findings on diagnostic imaging of other specified body structures: Secondary | ICD-10-CM

## 2014-09-02 MED ORDER — IOHEXOL 350 MG/ML SOLN
100.0000 mL | Freq: Once | INTRAVENOUS | Status: AC | PRN
  Administered 2014-09-02: 100 mL via INTRAVENOUS

## 2014-09-03 ENCOUNTER — Encounter: Payer: Self-pay | Admitting: Family Medicine

## 2014-09-03 DIAGNOSIS — J439 Emphysema, unspecified: Secondary | ICD-10-CM | POA: Insufficient documentation

## 2014-09-03 DIAGNOSIS — R911 Solitary pulmonary nodule: Secondary | ICD-10-CM | POA: Insufficient documentation

## 2014-09-04 ENCOUNTER — Ambulatory Visit (HOSPITAL_BASED_OUTPATIENT_CLINIC_OR_DEPARTMENT_OTHER): Payer: Medicare Other

## 2014-09-05 ENCOUNTER — Ambulatory Visit (INDEPENDENT_AMBULATORY_CARE_PROVIDER_SITE_OTHER): Payer: Medicare Other | Admitting: Family Medicine

## 2014-09-05 ENCOUNTER — Encounter: Payer: Self-pay | Admitting: Family Medicine

## 2014-09-05 VITALS — BP 126/70 | HR 75 | Wt 155.0 lb

## 2014-09-05 DIAGNOSIS — J209 Acute bronchitis, unspecified: Secondary | ICD-10-CM | POA: Diagnosis not present

## 2014-09-05 DIAGNOSIS — A499 Bacterial infection, unspecified: Secondary | ICD-10-CM | POA: Diagnosis not present

## 2014-09-05 DIAGNOSIS — R911 Solitary pulmonary nodule: Secondary | ICD-10-CM | POA: Diagnosis not present

## 2014-09-05 DIAGNOSIS — I482 Chronic atrial fibrillation, unspecified: Secondary | ICD-10-CM

## 2014-09-05 DIAGNOSIS — I4891 Unspecified atrial fibrillation: Secondary | ICD-10-CM | POA: Diagnosis not present

## 2014-09-05 DIAGNOSIS — J208 Acute bronchitis due to other specified organisms: Secondary | ICD-10-CM

## 2014-09-05 DIAGNOSIS — G43909 Migraine, unspecified, not intractable, without status migrainosus: Secondary | ICD-10-CM | POA: Diagnosis not present

## 2014-09-05 DIAGNOSIS — B9689 Other specified bacterial agents as the cause of diseases classified elsewhere: Secondary | ICD-10-CM | POA: Diagnosis not present

## 2014-09-05 MED ORDER — HYDROCODONE-HOMATROPINE 5-1.5 MG/5ML PO SYRP: 5.0000 mL | ORAL_SOLUTION | Freq: Three times a day (TID) | ORAL | Status: DC | PRN

## 2014-09-05 MED ORDER — KETOROLAC TROMETHAMINE 60 MG/2ML IM SOLN
60.0000 mg | Freq: Once | INTRAMUSCULAR | Status: AC
  Administered 2014-09-05: 60 mg via INTRAMUSCULAR

## 2014-09-05 NOTE — Progress Notes (Signed)
CC: Janet Joyce is a 58 y.o. female is here for f/u labs and Coagulation Disorder   Subjective: HPI: Shares the sad news that her fianc died last week  Followup acute bronchitis: Patient states that since she was seen in our office last her cough has significantly improved however continued keep her awake at night. She's not sure whether or not Combivent has helped. She is less short of breath now and her cough is no longer very productive. She continues to smoke half a pack of cigarettes a day but has been successfully slowly cutting back over the past 6 months. She reports chest pain that is localized right underneath the right axilla that has been present since Saturday that came on hours after she was repetitively hugged by what sounds like could have been 100 family members individually at a funeral. Denies blood in sputum, pain with breathing. Pain is reproduced with movement of the right arm or coughing.  Followup atrial fibrillation: Continues to take Coumadin 3 mg on a daily basis. Denies bleeding or bruising abnormalities. Denies any new motor or sensory disturbances  Followup pulmonary nodule: On a recent CT scan she was found to have a nodule and some significant scar tissue on the pleura of the right lung. I am unable to find remote CT scan reports since her lung resection in 2007 for comparison.  She complains of a headache described as a pounding pressure above the right eye near the right forehead but not on the temple accompanied by mild nausea and photophobia. She states this feels like prior migraines and has been present for about a day. No interventions as of yet moderate in severity. Denies fevers, chills, confusion    Review Of Systems Outlined In HPI  Past Medical History  Diagnosis Date  . Heart attack   . Hyperlipidemia   . Depression     No past surgical history on file. Family History  Problem Relation Age of Onset  . Diabetes Father     History    Social History  . Marital Status: Unknown    Spouse Name: N/A    Number of Children: N/A  . Years of Education: N/A   Occupational History  . Not on file.   Social History Main Topics  . Smoking status: Current Every Day Smoker -- 0.50 packs/day    Types: Cigarettes  . Smokeless tobacco: Not on file  . Alcohol Use: No  . Drug Use: No  . Sexual Activity: No   Other Topics Concern  . Not on file   Social History Narrative  . No narrative on file     Objective: BP 126/70  Pulse 75  Wt 155 lb (70.308 kg)  General: Alert and Oriented, No Acute Distress HEENT: Pupils equal, round, reactive to light. Conjunctivae clear.  Moist and his memories pharynx unremarkable Lungs: Clear to auscultation bilaterally, no wheezing/ronchi/rales.  Comfortable work of breathing. Good air movement. Cardiac: Irregularly irregular rhythm less than 100 beats per minute Chest: Her pain is reproduced with palpation of the insertion of the right pectoralis muscle on the lateral anterior chest wall. No crepitus with palpation of this region Extremities: No peripheral edema.  Strong peripheral pulses. Full range of motion strength in the right upper extremity Mental Status: No depression, anxiety, nor agitation. Skin: Warm and dry. She has extensive scarring of her skin overlying the sites to correspond with the scarring seen on her CT scan  Assessment & Plan: Janet Joyce was seen today for  f/u labs and coagulation disorder.  Diagnoses and associated orders for this visit:  Migraine, unspecified, without mention of intractable migraine without mention of status migrainosus - ketorolac (TORADOL) injection 60 mg; Inject 2 mLs (60 mg total) into the muscle once.  Acute bacterial bronchitis - HYDROcodone-homatropine (HYCODAN) 5-1.5 MG/5ML syrup; Take 5 mLs by mouth every 8 (eight) hours as needed for cough.  Chronic atrial fibrillation - POCT INR  Solitary pulmonary nodule    Migraine: Provided  with Toradol here in the clinic for resolution Acute bacterial bronchitis: With her recent CT scan, chronic symptoms, tobacco use I discussed with her that she most likely suffers from COPD with emphysema seen on the recent CT scan. Discussed that cutting back on smoking will help prevent further decline in lung function. Once her chest wall pain is improved I've asked her to return for formal spirometry in the middle of October. For now Hycodan to help with   cough causing sleep disturbance. Discussed that her chest wall pain is due to a muscular strain not likely due to a pulmonary process Atrial fibrillation: Rate controlled INR is therapeutic repeat INR in one week Pulmonary nodule: Reiterated that we need to get a repeat CT scan with contrast in 3 months however given the location of her surgery some of the changes on her CT scan are very likely due to her remote surgery however cancer cannot be ruled out. She was advised to start 14 or 21 mg / day nicotine patches to help with smoking cessation   Return for One week for Nurse Visit INR Middle of October for [facility].

## 2014-09-11 ENCOUNTER — Other Ambulatory Visit: Payer: Medicare Other | Admitting: Family Medicine

## 2014-09-12 ENCOUNTER — Ambulatory Visit (INDEPENDENT_AMBULATORY_CARE_PROVIDER_SITE_OTHER): Payer: Medicare Other | Admitting: Family Medicine

## 2014-09-12 VITALS — BP 114/84 | HR 69 | Resp 16 | Wt 154.0 lb

## 2014-09-12 DIAGNOSIS — Z954 Presence of other heart-valve replacement: Secondary | ICD-10-CM

## 2014-09-12 DIAGNOSIS — I4891 Unspecified atrial fibrillation: Secondary | ICD-10-CM | POA: Diagnosis not present

## 2014-09-12 DIAGNOSIS — I482 Chronic atrial fibrillation, unspecified: Secondary | ICD-10-CM

## 2014-09-12 LAB — PROTIME-INR
INR: 4.11 — AB (ref <1.50)
PROTHROMBIN TIME: 40.1 s — AB (ref 11.6–15.2)

## 2014-09-12 LAB — POCT INR: INR: 5.4

## 2014-09-12 NOTE — Progress Notes (Signed)
Pt.notified

## 2014-09-12 NOTE — Progress Notes (Signed)
Seth Bake or Mardene Celeste, Will you please let patient know that her venipuncture INR was 4.1.  If my records are correct she has been taking 3mg  of coumadin on a daily basis.  If this is true then I'd recommend holding a dose for one day then begin taking 2mg  then 3mg  alternating every other day with a repeat INR in one week.

## 2014-09-12 NOTE — Patient Instructions (Signed)
Patient instructed to hold Coumadin intake until notified by Dr.Hommel about correct dose based on her INR stat done in [facility] at 2:30pm today (POCT INR today was 5.4). P.Jaclyn Carew, RN

## 2014-09-17 ENCOUNTER — Other Ambulatory Visit: Payer: Self-pay | Admitting: Family Medicine

## 2014-09-18 ENCOUNTER — Telehealth: Payer: Self-pay | Admitting: Family Medicine

## 2014-09-18 MED ORDER — TRAMADOL HCL 50 MG PO TABS: ORAL_TABLET | ORAL | Status: DC

## 2014-09-18 NOTE — Telephone Encounter (Signed)
Janet Joyce, Rx placed in in-box ready for pickup/faxing.  

## 2014-09-18 NOTE — Telephone Encounter (Signed)
Faxed

## 2014-09-19 ENCOUNTER — Other Ambulatory Visit: Payer: Self-pay | Admitting: *Deleted

## 2014-09-19 ENCOUNTER — Ambulatory Visit (INDEPENDENT_AMBULATORY_CARE_PROVIDER_SITE_OTHER): Payer: Medicare Other | Admitting: Family Medicine

## 2014-09-19 VITALS — BP 124/64 | HR 78 | Ht 63.0 in | Wt 158.0 lb

## 2014-09-19 DIAGNOSIS — I482 Chronic atrial fibrillation, unspecified: Secondary | ICD-10-CM

## 2014-09-19 DIAGNOSIS — G8929 Other chronic pain: Secondary | ICD-10-CM

## 2014-09-19 DIAGNOSIS — M549 Dorsalgia, unspecified: Secondary | ICD-10-CM | POA: Diagnosis not present

## 2014-09-19 DIAGNOSIS — Z952 Presence of prosthetic heart valve: Secondary | ICD-10-CM

## 2014-09-19 DIAGNOSIS — Z951 Presence of aortocoronary bypass graft: Secondary | ICD-10-CM

## 2014-09-19 DIAGNOSIS — Z954 Presence of other heart-valve replacement: Secondary | ICD-10-CM | POA: Diagnosis not present

## 2014-09-19 LAB — POCT INR: INR: 2.7

## 2014-09-19 MED ORDER — OXYCODONE-ACETAMINOPHEN 10-325 MG PO TABS: 1.0000 | ORAL_TABLET | Freq: Four times a day (QID) | ORAL | Status: DC | PRN

## 2014-09-19 NOTE — Patient Instructions (Signed)
Will you please let patient know that her INR is therapeutic today, continue Coumadin 2mg  then 3mg  alternating every other day with a repeat INR in one week and if it remains therapeutic we can space this out to a month.

## 2014-09-19 NOTE — Progress Notes (Signed)
Will you please let patient know that her INR is therapeutic today, continue Coumadin 2mg  then 3mg  alternating every other day with a repeat INR in one week and if it remains therapeutic we can space this out to a month.

## 2014-09-20 NOTE — Progress Notes (Signed)
Pt.notified

## 2014-09-27 ENCOUNTER — Ambulatory Visit: Payer: Medicare Other

## 2014-09-28 ENCOUNTER — Ambulatory Visit (INDEPENDENT_AMBULATORY_CARE_PROVIDER_SITE_OTHER): Payer: Medicare Other | Admitting: Family Medicine

## 2014-09-28 ENCOUNTER — Encounter: Payer: Self-pay | Admitting: Family Medicine

## 2014-09-28 VITALS — BP 126/73 | HR 96 | Temp 97.5°F | Wt 159.0 lb

## 2014-09-28 DIAGNOSIS — J439 Emphysema, unspecified: Secondary | ICD-10-CM | POA: Diagnosis not present

## 2014-09-28 DIAGNOSIS — J449 Chronic obstructive pulmonary disease, unspecified: Secondary | ICD-10-CM | POA: Diagnosis not present

## 2014-09-28 DIAGNOSIS — Z954 Presence of other heart-valve replacement: Secondary | ICD-10-CM

## 2014-09-28 DIAGNOSIS — Z952 Presence of prosthetic heart valve: Secondary | ICD-10-CM

## 2014-09-28 LAB — POCT INR: INR: 2

## 2014-09-28 MED ORDER — PREDNISONE 50 MG PO TABS: 50.0000 mg | ORAL_TABLET | Freq: Every day | ORAL | Status: DC

## 2014-09-28 MED ORDER — AZITHROMYCIN 250 MG PO TABS: ORAL_TABLET | ORAL | Status: AC

## 2014-09-28 NOTE — Progress Notes (Signed)
CC: Janet Joyce is a 58 y.o. female is here for not feeling well   Subjective: HPI:  Follow up atrial fibrillation, aortic valve replacement: Continues to take Coumadin 3 mg daily with no bruising bleeding or new motor or sensory disturbances. Denies racing heartbeat shortness of breath nor orthopnea.  Complaints of worsening cough with increased sputum production and right chest wall pain only when coughing. Cough is described as moderate in severity slightly improved from Combivent however only for a few hours.  Present all hours today since the worse in the night improves with sitting upright during the day. Continuing to try to cut back on smoking. Denies fevers, chills, wheezing, exertional chest pain.   Review Of Systems Outlined In HPI  Past Medical History  Diagnosis Date  . Heart attack   . Hyperlipidemia   . Depression     No past surgical history on file. Family History  Problem Relation Age of Onset  . Diabetes Father     History   Social History  . Marital Status: Unknown    Spouse Name: N/A    Number of Children: N/A  . Years of Education: N/A   Occupational History  . Not on file.   Social History Main Topics  . Smoking status: Current Every Day Smoker -- 0.50 packs/day    Types: Cigarettes  . Smokeless tobacco: Not on file  . Alcohol Use: No  . Drug Use: No  . Sexual Activity: No   Other Topics Concern  . Not on file   Social History Narrative  . No narrative on file     Objective: BP 126/73  Pulse 96  Temp(Src) 97.5 F (36.4 C) (Oral)  Wt 159 lb (72.122 kg)  SpO2 96%  General: Alert and Oriented, No Acute Distress HEENT: Pupils equal, round, reactive to light. Conjunctivae clear.  External ears unremarkable, canals clear with intact TMs with appropriate landmarks.  Middle ear appears open without effusion. Pink inferior turbinates.  Moist mucous membranes, pharynx without inflammation nor lesions.  Neck supple without palpable  lymphadenopathy nor abnormal masses. Lungs: Comfortable work of breathing with trace end expiratory wheezing and rhonchi. No rales nor signs of consolidation Cardiac: Irregularly irregular rhythm with mechanical S2. Extremities: No peripheral edema.  Strong peripheral pulses.  Mental Status: No depression, anxiety, nor agitation. Skin: Warm and dry.  Assessment & Plan: Janet Joyce was seen today for not feeling well.  Diagnoses and associated orders for this visit:  H/O aortic valve replacement - POCT INR  Pulmonary emphysema, unspecified emphysema type  Chronic obstructive pulmonary disease, unspecified COPD, unspecified chronic bronchitis type - azithromycin (ZITHROMAX) 250 MG tablet; Take two tabs at once on day 1, then one tab daily on days 2-5. - predniSONE (DELTASONE) 50 MG tablet; Take 1 tablet (50 mg total) by mouth daily.    Emphysema: COPD exacerbation today therefore start prednisone and azithromycin. If no better next week we'll start inhaled corticosteroid Aortic valve replacement with atrial fibrillation: INR is subtherapeutic today at 2.0 therefore continue Coumadin 3 mg daily however on Thursdays and Tuesdays take only 2 mg. Repeat INR one week  Return in about 1 week (around 10/05/2014).

## 2014-10-04 ENCOUNTER — Ambulatory Visit (INDEPENDENT_AMBULATORY_CARE_PROVIDER_SITE_OTHER): Payer: Medicare Other | Admitting: Family Medicine

## 2014-10-04 DIAGNOSIS — Z952 Presence of prosthetic heart valve: Secondary | ICD-10-CM

## 2014-10-04 DIAGNOSIS — Z951 Presence of aortocoronary bypass graft: Secondary | ICD-10-CM

## 2014-10-04 DIAGNOSIS — I4891 Unspecified atrial fibrillation: Secondary | ICD-10-CM | POA: Diagnosis not present

## 2014-10-04 LAB — POCT INR: INR: 2

## 2014-10-05 ENCOUNTER — Other Ambulatory Visit: Payer: Medicare Other | Admitting: Family Medicine

## 2014-10-11 ENCOUNTER — Ambulatory Visit (INDEPENDENT_AMBULATORY_CARE_PROVIDER_SITE_OTHER): Payer: Medicare Other | Admitting: Family Medicine

## 2014-10-11 ENCOUNTER — Ambulatory Visit: Payer: Medicare Other

## 2014-10-11 VITALS — Temp 97.7°F

## 2014-10-11 DIAGNOSIS — Z951 Presence of aortocoronary bypass graft: Secondary | ICD-10-CM

## 2014-10-11 DIAGNOSIS — I482 Chronic atrial fibrillation, unspecified: Secondary | ICD-10-CM

## 2014-10-11 DIAGNOSIS — Z952 Presence of prosthetic heart valve: Secondary | ICD-10-CM

## 2014-10-11 LAB — POCT INR: INR: 2.3

## 2014-10-11 NOTE — Progress Notes (Signed)
Patient is aware of plan of care. Margette Fast, CMA

## 2014-10-23 ENCOUNTER — Ambulatory Visit (INDEPENDENT_AMBULATORY_CARE_PROVIDER_SITE_OTHER): Payer: Medicare Other | Admitting: Family Medicine

## 2014-10-23 ENCOUNTER — Other Ambulatory Visit: Payer: Self-pay | Admitting: *Deleted

## 2014-10-23 VITALS — BP 110/62 | HR 66

## 2014-10-23 DIAGNOSIS — G8929 Other chronic pain: Secondary | ICD-10-CM

## 2014-10-23 DIAGNOSIS — Z951 Presence of aortocoronary bypass graft: Secondary | ICD-10-CM

## 2014-10-23 DIAGNOSIS — I4891 Unspecified atrial fibrillation: Secondary | ICD-10-CM | POA: Diagnosis not present

## 2014-10-23 DIAGNOSIS — Z954 Presence of other heart-valve replacement: Secondary | ICD-10-CM | POA: Diagnosis not present

## 2014-10-23 DIAGNOSIS — M549 Dorsalgia, unspecified: Principal | ICD-10-CM

## 2014-10-23 DIAGNOSIS — Z952 Presence of prosthetic heart valve: Secondary | ICD-10-CM

## 2014-10-23 LAB — POCT INR: INR: 1.9

## 2014-10-23 MED ORDER — OXYCODONE-ACETAMINOPHEN 10-325 MG PO TABS: 1.0000 | ORAL_TABLET | Freq: Four times a day (QID) | ORAL | Status: DC | PRN

## 2014-10-23 NOTE — Progress Notes (Signed)
Patient aware. Janet Joyce, CMA 

## 2014-10-27 ENCOUNTER — Other Ambulatory Visit: Payer: Self-pay | Admitting: Family Medicine

## 2014-11-06 ENCOUNTER — Ambulatory Visit (INDEPENDENT_AMBULATORY_CARE_PROVIDER_SITE_OTHER): Payer: Medicare Other | Admitting: Family Medicine

## 2014-11-06 VITALS — BP 129/62 | HR 67

## 2014-11-06 DIAGNOSIS — I4891 Unspecified atrial fibrillation: Secondary | ICD-10-CM

## 2014-11-06 DIAGNOSIS — Z952 Presence of prosthetic heart valve: Secondary | ICD-10-CM | POA: Diagnosis not present

## 2014-11-06 DIAGNOSIS — Z954 Presence of other heart-valve replacement: Secondary | ICD-10-CM | POA: Diagnosis not present

## 2014-11-06 DIAGNOSIS — Z951 Presence of aortocoronary bypass graft: Secondary | ICD-10-CM

## 2014-11-06 LAB — POCT INR: INR: 4

## 2014-11-06 NOTE — Progress Notes (Addendum)
Slight decrease of dose.2mg  on Tues and Thurs. 3mg  all other days. Recheck 1 week Addended Nov 25: see lab result, 2mg  on Tues. 3mg  all other days.

## 2014-11-06 NOTE — Patient Instructions (Addendum)
Slight decrease of dose.2mg  on Tues and Thurs. 3mg  all other days. Recheck 1 week  Addended Nov 25: see lab result, 2mg  on Tues. 3mg  all other days.

## 2014-11-06 NOTE — Progress Notes (Signed)
Detailed voicemail left for Cloey new med dosage. Margette Fast, CMA

## 2014-11-07 LAB — PROTIME-INR
INR: 3.34 — ABNORMAL HIGH (ref <1.50)
Prothrombin Time: 33.9 seconds — ABNORMAL HIGH (ref 11.6–15.2)

## 2014-11-16 ENCOUNTER — Ambulatory Visit (INDEPENDENT_AMBULATORY_CARE_PROVIDER_SITE_OTHER): Payer: Medicare Other | Admitting: Family Medicine

## 2014-11-16 VITALS — BP 133/72 | HR 74

## 2014-11-16 DIAGNOSIS — Z951 Presence of aortocoronary bypass graft: Secondary | ICD-10-CM

## 2014-11-16 DIAGNOSIS — Z954 Presence of other heart-valve replacement: Secondary | ICD-10-CM | POA: Diagnosis not present

## 2014-11-16 DIAGNOSIS — I4891 Unspecified atrial fibrillation: Secondary | ICD-10-CM

## 2014-11-16 DIAGNOSIS — Z952 Presence of prosthetic heart valve: Secondary | ICD-10-CM

## 2014-11-16 LAB — POCT INR: INR: 2.3

## 2014-11-16 NOTE — Addendum Note (Signed)
Addended by: Marcial Pacas on: 11/16/2014 03:03 PM   Modules accepted: Level of Service

## 2014-11-16 NOTE — Progress Notes (Signed)
Leonette was notified of coumadin regimen and verbalized understanding. Margette Fast, CMA

## 2014-11-16 NOTE — Progress Notes (Signed)
Patient confirmed taking only 2 mg one day and 3 mg rest of days. Advised patient to stay on current dose and recheck in 2 weeks.

## 2014-11-16 NOTE — Progress Notes (Signed)
My understanding was that she is taking coumadin 2mg  on Tues. 3mg  all other days.  If this is true continue current regimen and repeat INR two weeks.

## 2014-11-22 ENCOUNTER — Other Ambulatory Visit: Payer: Self-pay | Admitting: *Deleted

## 2014-11-22 DIAGNOSIS — M549 Dorsalgia, unspecified: Principal | ICD-10-CM

## 2014-11-22 DIAGNOSIS — G8929 Other chronic pain: Secondary | ICD-10-CM

## 2014-11-22 MED ORDER — OXYCODONE-ACETAMINOPHEN 10-325 MG PO TABS: 1.0000 | ORAL_TABLET | Freq: Four times a day (QID) | ORAL | Status: DC | PRN

## 2014-11-22 NOTE — Telephone Encounter (Signed)
Janet Joyce called for refill of her medication and will be in this afternoon to pick it up. Margette Fast, CMA

## 2014-11-28 ENCOUNTER — Ambulatory Visit: Payer: Medicare Other

## 2014-11-29 ENCOUNTER — Telehealth: Payer: Self-pay | Admitting: Family Medicine

## 2014-11-29 ENCOUNTER — Other Ambulatory Visit: Payer: Self-pay | Admitting: Family Medicine

## 2014-11-29 ENCOUNTER — Ambulatory Visit (INDEPENDENT_AMBULATORY_CARE_PROVIDER_SITE_OTHER): Payer: Medicare Other | Admitting: Sports Medicine

## 2014-11-29 VITALS — BP 131/63 | HR 73

## 2014-11-29 DIAGNOSIS — Z951 Presence of aortocoronary bypass graft: Secondary | ICD-10-CM

## 2014-11-29 DIAGNOSIS — Z954 Presence of other heart-valve replacement: Secondary | ICD-10-CM | POA: Diagnosis not present

## 2014-11-29 DIAGNOSIS — I4891 Unspecified atrial fibrillation: Secondary | ICD-10-CM | POA: Diagnosis not present

## 2014-11-29 DIAGNOSIS — Z952 Presence of prosthetic heart valve: Secondary | ICD-10-CM

## 2014-11-29 LAB — POCT INR: INR: 3.4

## 2014-11-29 NOTE — Telephone Encounter (Signed)
Janet Joyce, Janet Joyce tells me she did not get Dr. Mcneil Sober advice yet but can you please call her Friday to reinforce my recommendations that differ from Dr. Darene Lamer and to make sure she doesn't have any new questions? I have asked her to return in 2 weeks to repeat her INR.  (I appreciate Dr. Mcneil Sober recommendations today while I was out of the office, I believe there may be more to this patient's history that will influence her goal INR.  My understanding is that Janet Joyce has been taking coumadin 2mg  on Tues 3mg  all other days with an INR of 3.4 today.  I reviewed Dr. Shearon Stalls notes (her cardiologist) and he has documented that she has a [employer]. Jude mechanical aortic valve.  Janet Joyce has shown me her [employer]. Jude implant card in the past.    I called her Thursday night to confirm the above and she states that it is all correct.   With her diagnosis of atrial fibrillation, 2014 AHA/ACC guidelines would recommend an INR of 3, essentially 2.5-3.5.  I told her that she is at goal and should not change her current coumadin2mg  on Tues 3mg  all other days.  She is in agreement.)

## 2014-11-30 ENCOUNTER — Other Ambulatory Visit: Payer: Self-pay | Admitting: *Deleted

## 2014-11-30 MED ORDER — CHLORDIAZEPOXIDE-AMITRIPTYLINE 5-12.5 MG PO TABS: ORAL_TABLET | ORAL | Status: DC

## 2014-11-30 NOTE — Telephone Encounter (Signed)
Spoke with pt and reiterated advice.Pt voiced undestanding

## 2014-12-12 ENCOUNTER — Encounter: Payer: Self-pay | Admitting: Family Medicine

## 2014-12-12 ENCOUNTER — Ambulatory Visit (INDEPENDENT_AMBULATORY_CARE_PROVIDER_SITE_OTHER): Payer: Medicare Other | Admitting: Family Medicine

## 2014-12-12 VITALS — BP 131/72 | HR 70 | Temp 97.5°F | Wt 167.0 lb

## 2014-12-12 DIAGNOSIS — K2100 Gastro-esophageal reflux disease with esophagitis, without bleeding: Secondary | ICD-10-CM

## 2014-12-12 DIAGNOSIS — Z954 Presence of other heart-valve replacement: Secondary | ICD-10-CM

## 2014-12-12 DIAGNOSIS — B3789 Other sites of candidiasis: Secondary | ICD-10-CM | POA: Diagnosis not present

## 2014-12-12 DIAGNOSIS — R911 Solitary pulmonary nodule: Secondary | ICD-10-CM | POA: Diagnosis not present

## 2014-12-12 DIAGNOSIS — I482 Chronic atrial fibrillation, unspecified: Secondary | ICD-10-CM

## 2014-12-12 DIAGNOSIS — Z952 Presence of prosthetic heart valve: Secondary | ICD-10-CM

## 2014-12-12 MED ORDER — NYSTATIN 100000 UNIT/GM EX POWD: CUTANEOUS | Status: DC

## 2014-12-12 MED ORDER — OMEPRAZOLE 40 MG PO CPDR: DELAYED_RELEASE_CAPSULE | ORAL | Status: DC

## 2014-12-12 NOTE — Progress Notes (Signed)
CC: Janet Joyce is a 58 y.o. female is here for Rash; Gastrophageal Reflux; and Coagulation Disorder   Subjective: HPI:  F/U Atrial Fib with Aortic Valve Replacement: Currently taking coumadin2mg  on Tues3mg  all other days.  Denies any bleeding or bruising. No rapid heartbeat. Denies motor or sensory disturbances. Denies chest pain or shortness of breath.  Follow-up pulmonary nodule: In September a nodule was found in her right chest. She has a history of smoking but there's been no unintentional weight loss recently or new shortness of breath.  Follow-up GERD: She tells me over the last month she's had worsening burning and acidic sensation in the back of her throat that seems to radiate up from the epigastric region. It is worse in the evening when lying down. It is improved with Rolaids however only temporarily. She has not regurgitated but she feels like she is on the verge of spitting up.denies any other gastrointestinal complaints  Complains of a itchy and burning rash underneath both breasts. Interventions have included triple antibiotic ointment. It's been there for 1-2 weeks. Nothing seems to help or worsen it. She denies skin changes elsewhere   Review Of Systems Outlined In HPI  Past Medical History  Diagnosis Date  . Heart attack   . Hyperlipidemia   . Depression     No past surgical history on file. Family History  Problem Relation Age of Onset  . Diabetes Father     History   Social History  . Marital Status: Unknown    Spouse Name: N/A    Number of Children: N/A  . Years of Education: N/A   Occupational History  . Not on file.   Social History Main Topics  . Smoking status: Current Every Day Smoker -- 0.50 packs/day    Types: Cigarettes  . Smokeless tobacco: Not on file  . Alcohol Use: No  . Drug Use: No  . Sexual Activity: No   Other Topics Concern  . Not on file   Social History Narrative     Objective: BP 131/72 mmHg  Pulse 70  Temp(Src)  97.5 F (36.4 C) (Oral)  Wt 167 lb (75.751 kg)  General: Alert and Oriented, No Acute Distress HEENT: Pupils equal, round, reactive to light. Conjunctivae clear.  Moist mucous membranes Lungs: Clear to auscultation bilaterally, no wheezing/ronchi/rales.  Comfortable work of breathing. Good air movement. Cardiac:irregularly irregular rhythm less than 100 bpm. Mechanical S2. Extremities: No peripheral edema.  Strong peripheral pulses.  Mental Status: No depression, anxiety, nor agitation. Skin: Warm and dry. Moderate erythema underneath the breasts with mild satellite lesions.  Assessment & Plan: Janet Joyce was seen today for rash, gastrophageal reflux and coagulation disorder.  Diagnoses and associated orders for this visit:  H/O aortic valve replacement - INR/PT  Solitary pulmonary nodule - CT Chest W Contrast; Future  Chronic atrial fibrillation - INR/PT  Candidiasis of breast - nystatin (MYCOSTATIN/NYSTOP) 100000 UNIT/GM POWD; Apply twice a day for up to one week after resolution of rash.  Gastroesophageal reflux disease with esophagitis - omeprazole (PRILOSEC) 40 MG capsule; One by mouth daily at least one hour before a meal.    Aortic valve replacement with atrial fibrillation: Currently rate controlled, needs INR however fingerstick was not cooperating today she'll need to go downstairs for venipuncture. No change to Coumadin regimen pending these results. Pulmonary nodule: Due for repeat CT scan, I put an order in for this today and asked her to go down to the radiology office to schedule this GERD:  Uncontrolled chronic condition starting omeprazole daily Breast candidiasis: Begin using nystatin, please let me know if not effective after 1 week.  Return if symptoms worsen or fail to improve.

## 2014-12-13 ENCOUNTER — Other Ambulatory Visit: Payer: Self-pay | Admitting: Family Medicine

## 2014-12-13 ENCOUNTER — Telehealth: Payer: Self-pay | Admitting: Family Medicine

## 2014-12-13 LAB — PROTIME-INR
INR: 1.78 — ABNORMAL HIGH (ref <1.50)
PROTHROMBIN TIME: 20.7 s — AB (ref 11.6–15.2)

## 2014-12-13 NOTE — Telephone Encounter (Signed)
Seth Bake, Will you please let patient know her INR was 1.8.  I'd recommend she take 3mg  of coumadin on a daily basis and have this rechecked in one week.  (my understanding is that she was taking 3mg  daily except for only 2mg  on Tuesdays)

## 2014-12-13 NOTE — Telephone Encounter (Signed)
Pt.notified

## 2014-12-17 ENCOUNTER — Encounter (HOSPITAL_BASED_OUTPATIENT_CLINIC_OR_DEPARTMENT_OTHER): Payer: Self-pay

## 2014-12-17 ENCOUNTER — Ambulatory Visit (HOSPITAL_BASED_OUTPATIENT_CLINIC_OR_DEPARTMENT_OTHER)
Admission: RE | Admit: 2014-12-17 | Discharge: 2014-12-17 | Disposition: A | Discharge status: Home / Self Care | Payer: Medicare Other | Source: Ambulatory Visit | Attending: Family Medicine | Admitting: Family Medicine

## 2014-12-17 ENCOUNTER — Telehealth: Payer: Self-pay | Admitting: Family Medicine

## 2014-12-17 DIAGNOSIS — R911 Solitary pulmonary nodule: Secondary | ICD-10-CM

## 2014-12-17 DIAGNOSIS — M5134 Other intervertebral disc degeneration, thoracic region: Secondary | ICD-10-CM | POA: Diagnosis not present

## 2014-12-17 DIAGNOSIS — J984 Other disorders of lung: Secondary | ICD-10-CM | POA: Insufficient documentation

## 2014-12-17 DIAGNOSIS — R918 Other nonspecific abnormal finding of lung field: Secondary | ICD-10-CM | POA: Diagnosis not present

## 2014-12-17 DIAGNOSIS — Z952 Presence of prosthetic heart valve: Secondary | ICD-10-CM | POA: Diagnosis not present

## 2014-12-17 DIAGNOSIS — J432 Centrilobular emphysema: Secondary | ICD-10-CM | POA: Insufficient documentation

## 2014-12-17 DIAGNOSIS — J439 Emphysema, unspecified: Secondary | ICD-10-CM | POA: Diagnosis not present

## 2014-12-17 HISTORY — DX: Heart failure, unspecified: I50.9

## 2014-12-17 MED ORDER — IOHEXOL 300 MG/ML  SOLN
80.0000 mL | Freq: Once | INTRAMUSCULAR | Status: AC | PRN
  Administered 2014-12-17: 80 mL via INTRAVENOUS

## 2014-12-17 NOTE — Telephone Encounter (Signed)
Seth Bake, Will you please let patient know that her CT scan was reassuring and her scar tissue has not changed in appearance which makes lung cancer very unlikely.  We'll talk about possibly getting another CT scan in six months however I would not be worried about lung cancer at this time.

## 2014-12-18 NOTE — Telephone Encounter (Signed)
Pt.notified

## 2014-12-21 ENCOUNTER — Other Ambulatory Visit: Payer: Self-pay | Admitting: *Deleted

## 2014-12-21 ENCOUNTER — Ambulatory Visit (INDEPENDENT_AMBULATORY_CARE_PROVIDER_SITE_OTHER): Payer: Medicare Other | Admitting: Family Medicine

## 2014-12-21 VITALS — BP 115/66 | HR 70

## 2014-12-21 DIAGNOSIS — I4891 Unspecified atrial fibrillation: Secondary | ICD-10-CM | POA: Diagnosis not present

## 2014-12-21 DIAGNOSIS — Z951 Presence of aortocoronary bypass graft: Secondary | ICD-10-CM

## 2014-12-21 DIAGNOSIS — M549 Dorsalgia, unspecified: Principal | ICD-10-CM

## 2014-12-21 DIAGNOSIS — Z954 Presence of other heart-valve replacement: Secondary | ICD-10-CM

## 2014-12-21 DIAGNOSIS — Z952 Presence of prosthetic heart valve: Secondary | ICD-10-CM

## 2014-12-21 DIAGNOSIS — G8929 Other chronic pain: Secondary | ICD-10-CM

## 2014-12-21 LAB — POCT INR: INR: 2.2

## 2014-12-21 MED ORDER — WARFARIN SODIUM 1 MG PO TABS: ORAL_TABLET | ORAL | Status: DC

## 2014-12-21 MED ORDER — OXYCODONE-ACETAMINOPHEN 10-325 MG PO TABS: 1.0000 | ORAL_TABLET | Freq: Four times a day (QID) | ORAL | Status: DC | PRN

## 2014-12-28 ENCOUNTER — Telehealth: Payer: Self-pay | Admitting: *Deleted

## 2014-12-28 ENCOUNTER — Ambulatory Visit (INDEPENDENT_AMBULATORY_CARE_PROVIDER_SITE_OTHER): Payer: Medicare Other | Admitting: Family Medicine

## 2014-12-28 DIAGNOSIS — Z951 Presence of aortocoronary bypass graft: Secondary | ICD-10-CM

## 2014-12-28 DIAGNOSIS — Z954 Presence of other heart-valve replacement: Secondary | ICD-10-CM

## 2014-12-28 DIAGNOSIS — Z952 Presence of prosthetic heart valve: Secondary | ICD-10-CM

## 2014-12-28 DIAGNOSIS — I4891 Unspecified atrial fibrillation: Secondary | ICD-10-CM

## 2014-12-28 LAB — POCT INR: INR: 2.9

## 2014-12-28 NOTE — Telephone Encounter (Signed)
-----   Message from [place], [place] sent at 12/28/2014  1:41 PM EST ----- Seth Bake, Will you please let patient know INR is at goal.  Continue taking coumadin 3mg  daily, with additional 1mg  to M and W.  Repeat INR one week and if therapeutic again we can space out visits.

## 2014-12-28 NOTE — Telephone Encounter (Signed)
Pt.notified

## 2014-12-28 NOTE — Progress Notes (Signed)
Janet Joyce, Will you please let patient know INR is at goal.  Continue taking coumadin 3mg  daily, with additional 1mg  to M and W.  Repeat INR one week and if therapeutic again we can space out visits.

## 2015-01-02 ENCOUNTER — Other Ambulatory Visit: Payer: Self-pay | Admitting: Family Medicine

## 2015-01-04 ENCOUNTER — Ambulatory Visit: Payer: Medicare Other

## 2015-01-10 ENCOUNTER — Telehealth: Payer: Self-pay | Admitting: *Deleted

## 2015-01-10 ENCOUNTER — Ambulatory Visit (INDEPENDENT_AMBULATORY_CARE_PROVIDER_SITE_OTHER): Payer: Medicare Other | Admitting: Family Medicine

## 2015-01-10 VITALS — BP 127/67 | HR 65

## 2015-01-10 DIAGNOSIS — Z952 Presence of prosthetic heart valve: Secondary | ICD-10-CM

## 2015-01-10 DIAGNOSIS — Z954 Presence of other heart-valve replacement: Secondary | ICD-10-CM

## 2015-01-10 DIAGNOSIS — I4891 Unspecified atrial fibrillation: Secondary | ICD-10-CM | POA: Diagnosis not present

## 2015-01-10 DIAGNOSIS — Z951 Presence of aortocoronary bypass graft: Secondary | ICD-10-CM

## 2015-01-10 LAB — POCT INR: INR: 3.6

## 2015-01-10 NOTE — Telephone Encounter (Signed)
Pt.notified

## 2015-01-10 NOTE — Progress Notes (Signed)
Seth Bake, Will you please let patient know INR is just barely above goal, since it's so close I'd recommend no change. Continue coumadin 3mg  daily, with additional 1mg  to M and W. Repeat INR one week.

## 2015-01-10 NOTE — Telephone Encounter (Signed)
-----   Message from Marcial Pacas, [place] sent at 01/10/2015 11:37 AM EST ----- Seth Bake, Will you please let patient know INR is just barely above goal, since it's so close I'd recommend no change. Continue coumadin 3mg  daily, with additional 1mg  to M and W. Repeat INR one week.

## 2015-01-17 ENCOUNTER — Telehealth: Payer: Self-pay | Admitting: Family Medicine

## 2015-01-17 ENCOUNTER — Ambulatory Visit (INDEPENDENT_AMBULATORY_CARE_PROVIDER_SITE_OTHER): Payer: Medicare Other | Admitting: Family Medicine

## 2015-01-17 VITALS — BP 116/63 | HR 66 | Resp 16 | Wt 166.0 lb

## 2015-01-17 DIAGNOSIS — I482 Chronic atrial fibrillation, unspecified: Secondary | ICD-10-CM

## 2015-01-17 DIAGNOSIS — Z951 Presence of aortocoronary bypass graft: Secondary | ICD-10-CM | POA: Diagnosis not present

## 2015-01-17 DIAGNOSIS — Z954 Presence of other heart-valve replacement: Secondary | ICD-10-CM | POA: Diagnosis not present

## 2015-01-17 DIAGNOSIS — Z952 Presence of prosthetic heart valve: Secondary | ICD-10-CM

## 2015-01-17 LAB — POCT INR: INR: 4.1

## 2015-01-17 NOTE — Patient Instructions (Signed)
Seth Bake, Will you please let patient know INR is still above goal, Continue coumadin 3mg  daily, with additional 1mg  only on Monday, no longer on Wednesday. Repeat INR one week.

## 2015-01-17 NOTE — Progress Notes (Signed)
Seth Bake, Will you please let patient know INR is still above goal, Continue coumadin 3mg  daily, with additional 1mg  only on Monday, no longer on Wednesday. Repeat INR one week.

## 2015-01-17 NOTE — Progress Notes (Signed)
Pt.notified

## 2015-01-17 NOTE — Telephone Encounter (Signed)
Opened for care everywhere 

## 2015-01-24 ENCOUNTER — Other Ambulatory Visit: Payer: Self-pay | Admitting: Family Medicine

## 2015-01-25 ENCOUNTER — Ambulatory Visit (INDEPENDENT_AMBULATORY_CARE_PROVIDER_SITE_OTHER): Payer: Medicare Other | Admitting: Family Medicine

## 2015-01-25 VITALS — BP 128/76 | HR 73 | Wt 166.0 lb

## 2015-01-25 DIAGNOSIS — Z952 Presence of prosthetic heart valve: Secondary | ICD-10-CM

## 2015-01-25 DIAGNOSIS — I4891 Unspecified atrial fibrillation: Secondary | ICD-10-CM

## 2015-01-25 DIAGNOSIS — Z951 Presence of aortocoronary bypass graft: Secondary | ICD-10-CM

## 2015-01-25 DIAGNOSIS — G8929 Other chronic pain: Secondary | ICD-10-CM

## 2015-01-25 DIAGNOSIS — M549 Dorsalgia, unspecified: Principal | ICD-10-CM

## 2015-01-25 LAB — POCT INR: INR: 3.3

## 2015-01-25 MED ORDER — OXYCODONE-ACETAMINOPHEN 10-325 MG PO TABS: 1.0000 | ORAL_TABLET | Freq: Four times a day (QID) | ORAL | Status: DC | PRN

## 2015-01-25 NOTE — Progress Notes (Signed)
Janet Joyce, Will you please let patient know INR is now perfect, continue current coumadin regimen and Repeat INR one week.

## 2015-01-25 NOTE — Patient Instructions (Addendum)
Seth Bake, Will you please let patient know INR is now perfect, continue current coumadin regimen and Repeat INR one week.

## 2015-01-28 ENCOUNTER — Telehealth: Payer: Self-pay | Admitting: Family Medicine

## 2015-01-28 MED ORDER — TRAMADOL HCL 50 MG PO TABS: 50.0000 mg | ORAL_TABLET | Freq: Three times a day (TID) | ORAL | Status: DC | PRN

## 2015-01-28 NOTE — Telephone Encounter (Signed)
Refill req 

## 2015-01-29 ENCOUNTER — Telehealth: Payer: Self-pay | Admitting: *Deleted

## 2015-01-29 NOTE — Telephone Encounter (Signed)
Pt.notified

## 2015-01-29 NOTE — Telephone Encounter (Signed)
-----   Message from Marcial Pacas, [place] sent at 01/25/2015  5:04 PM EST ----- Seth Bake, Will you please let patient know INR is now perfect, continue current coumadin regimen and Repeat INR one week.

## 2015-02-03 ENCOUNTER — Other Ambulatory Visit: Payer: Self-pay | Admitting: Family Medicine

## 2015-02-04 ENCOUNTER — Ambulatory Visit (INDEPENDENT_AMBULATORY_CARE_PROVIDER_SITE_OTHER): Payer: Medicare Other | Admitting: Family Medicine

## 2015-02-04 ENCOUNTER — Telehealth: Payer: Self-pay | Admitting: *Deleted

## 2015-02-04 VITALS — BP 121/67 | HR 77

## 2015-02-04 DIAGNOSIS — I4891 Unspecified atrial fibrillation: Secondary | ICD-10-CM | POA: Diagnosis not present

## 2015-02-04 DIAGNOSIS — Z954 Presence of other heart-valve replacement: Secondary | ICD-10-CM

## 2015-02-04 DIAGNOSIS — Z951 Presence of aortocoronary bypass graft: Secondary | ICD-10-CM | POA: Diagnosis not present

## 2015-02-04 DIAGNOSIS — Z952 Presence of prosthetic heart valve: Secondary | ICD-10-CM

## 2015-02-04 LAB — POCT INR: INR: 2.2

## 2015-02-04 NOTE — Telephone Encounter (Signed)
Pt.notified

## 2015-02-04 NOTE — Telephone Encounter (Signed)
-----   Message from [place], [place] sent at 02/04/2015  2:01 PM EST ----- Seth Bake, Will you please let patient know INR is a little low, continue 3mg  of coumadin daily and an additional 1mg  tablet on Monday and Thursday, repeat INR one week.

## 2015-02-08 ENCOUNTER — Encounter: Payer: Self-pay | Admitting: Family Medicine

## 2015-02-08 ENCOUNTER — Ambulatory Visit (INDEPENDENT_AMBULATORY_CARE_PROVIDER_SITE_OTHER): Payer: Medicare Other | Admitting: Family Medicine

## 2015-02-08 VITALS — BP 112/64 | HR 80 | Temp 97.9°F | Wt 168.0 lb

## 2015-02-08 DIAGNOSIS — F409 Phobic anxiety disorder, unspecified: Secondary | ICD-10-CM

## 2015-02-08 DIAGNOSIS — H1013 Acute atopic conjunctivitis, bilateral: Secondary | ICD-10-CM

## 2015-02-08 DIAGNOSIS — I4891 Unspecified atrial fibrillation: Secondary | ICD-10-CM

## 2015-02-08 LAB — POCT INR: INR: 2

## 2015-02-08 MED ORDER — AZELASTINE HCL 0.05 % OP SOLN: 1.0000 [drp] | Freq: Two times a day (BID) | OPHTHALMIC | Status: DC

## 2015-02-08 NOTE — Progress Notes (Signed)
CC: Janet Joyce is a 59 y.o. female is here for Facial Swelling   Subjective: HPI:  Complains of itching and clear drainage from both eyes that has been present for the last week. Has not been accompanied by any eye pain other than above or vision loss. No interventions as of yet. No change to her environment or personal care products. In the morning she has crust in her eyelashes but no other discharge. Symptoms are mild in severity and have not been getting better or worse since onset. No nasal congestion sore throat nor sneezing. No new pulmonary complaints  Follow-up atrial fibrillation: Taking Coumadin 3 mg daily with an additional 1 mg on Monday and Thursday. No bleeding or bruising. No motor or sensory disturbances nor chest discomfort   Complains of a state of fear that she is living in. She is currently living with her sister who has been acting erratically lately paranoid that a former loved one who is currently jailed likely hiding in their house or their car. Her sister has also been having anger outbursts. These symptoms are severe in severity and have been present for the last 1 or 2 weeks on a daily basis. Symptoms are only present when she is misusing her opiates, patient has also witnessed the sister crushing and snorting her opiates. Symptoms are absent if she takes her medications only as prescribed. Patient's sister carries a gun with her and although there has not been any confrontation with a gun patient is afraid that her sister may use the gun at some point.  Review Of Systems Outlined In HPI  Past Medical History  Diagnosis Date  . Heart attack   . Hyperlipidemia   . Depression   . CHF (congestive heart failure)     No past surgical history on file. Family History  Problem Relation Age of Onset  . Diabetes Father     History   Social History  . Marital Status: Divorced    Spouse Name: N/A  . Number of Children: N/A  . Years of Education: N/A    Occupational History  . Not on file.   Social History Main Topics  . Smoking status: Current Every Day Smoker -- 0.50 packs/day    Types: Cigarettes  . Smokeless tobacco: Not on file  . Alcohol Use: No  . Drug Use: No  . Sexual Activity: No   Other Topics Concern  . Not on file   Social History Narrative     Objective: BP 112/64 mmHg  Pulse 80  Temp(Src) 97.9 F (36.6 C) (Oral)  Wt 168 lb (76.204 kg)  SpO2 97%  Vital signs reviewed. General: Alert and Oriented, No Acute Distress HEENT: Pupils equal, round, reactive to light. Conjunctivae clear. Vision is at baseline, no photophobia. No appreciable discharge   External ears unremarkable.  Moist mucous membranes. Lungs: Clear and comfortable work of breathing, speaking in full sentences without accessory muscle use. Cardiac: Regular rate and rhythm.  Neuro: CN II-XII grossly intact, gait normal. Extremities: No peripheral edema.  Strong peripheral pulses.  Mental Status: No depression, anxiety, nor agitation. Logical though process. Skin: Warm and dry.  Assessment & Plan: Janet Joyce was seen today for facial swelling.  Diagnoses and all orders for this visit:  Allergic conjunctivitis, bilateral Orders: -     azelastine (OPTIVAR) 0.05 % ophthalmic solution; Place 1 drop into both eyes 2 (two) times daily.  Atrial fibrillation, unspecified Orders: -     POCT INR  Fear  allergic conjunctivae does: Start optivar Atrial fibrillation: INR is subtherapeutic continue 3 mg of Coumadin daily with an additional 1 mg on Monday Wednesday and Friday repeat INR in 1 week Fear: The majority of our visit was spent discussing that her sister is a threat to both the patient and the sister herself. I have strongly encouraged the patient to call local police the next time she sees any of this erratic behavior and if this behavior is confirmed and witnessed by the police I would expect that she would be involuntarily committed. Patient  is fearful that this would cause a retaliation and could cause harm to the patient however I discussed with her that this situation sounds serious enough to get the police involved. If the above behavior occurs and patient is unwilling to involve the place I discussed that it's imperative to have her sister move out and live with her son who if he witnessed this behavior I would hope would be of sound mind to involve the police for IVC.  40 minutes spent face-to-face during visit today of which at least 50% was counseling or coordinating care regarding: 1. Allergic conjunctivitis, bilateral   2. Atrial fibrillation, unspecified   3. Fear       Return in about 1 week (around 02/15/2015).

## 2015-02-14 ENCOUNTER — Telehealth: Payer: Self-pay | Admitting: *Deleted

## 2015-02-14 ENCOUNTER — Ambulatory Visit (INDEPENDENT_AMBULATORY_CARE_PROVIDER_SITE_OTHER): Payer: Medicare Other | Admitting: Family Medicine

## 2015-02-14 VITALS — BP 138/74 | HR 73

## 2015-02-14 DIAGNOSIS — Z952 Presence of prosthetic heart valve: Secondary | ICD-10-CM

## 2015-02-14 DIAGNOSIS — I4891 Unspecified atrial fibrillation: Secondary | ICD-10-CM

## 2015-02-14 DIAGNOSIS — Z951 Presence of aortocoronary bypass graft: Secondary | ICD-10-CM | POA: Diagnosis not present

## 2015-02-14 LAB — POCT INR: INR: 3.7

## 2015-02-14 NOTE — Telephone Encounter (Signed)
Pt advised of the following dose change: Decrease dose to 3mg  daily. Recheck in 2 weeks.  She voiced understanding .Audelia Hives [place]

## 2015-02-20 DIAGNOSIS — F322 Major depressive disorder, single episode, severe without psychotic features: Secondary | ICD-10-CM | POA: Diagnosis not present

## 2015-02-22 ENCOUNTER — Telehealth: Payer: Self-pay | Admitting: *Deleted

## 2015-02-22 ENCOUNTER — Ambulatory Visit (INDEPENDENT_AMBULATORY_CARE_PROVIDER_SITE_OTHER): Payer: Medicare Other | Admitting: Family Medicine

## 2015-02-22 ENCOUNTER — Other Ambulatory Visit: Payer: Self-pay | Admitting: *Deleted

## 2015-02-22 VITALS — BP 110/60 | HR 74 | Resp 16 | Wt 171.0 lb

## 2015-02-22 DIAGNOSIS — I482 Chronic atrial fibrillation, unspecified: Secondary | ICD-10-CM

## 2015-02-22 DIAGNOSIS — Z952 Presence of prosthetic heart valve: Secondary | ICD-10-CM

## 2015-02-22 DIAGNOSIS — Z951 Presence of aortocoronary bypass graft: Secondary | ICD-10-CM

## 2015-02-22 DIAGNOSIS — M549 Dorsalgia, unspecified: Principal | ICD-10-CM

## 2015-02-22 DIAGNOSIS — G8929 Other chronic pain: Secondary | ICD-10-CM

## 2015-02-22 LAB — POCT INR: INR: 2.9

## 2015-02-22 MED ORDER — OXYCODONE-ACETAMINOPHEN 10-325 MG PO TABS
1.0000 | ORAL_TABLET | Freq: Four times a day (QID) | ORAL | Status: DC | PRN
Start: 2015-02-22 — End: 2015-03-22

## 2015-02-22 NOTE — Telephone Encounter (Signed)
-----   Message from Marcial Pacas, [place] sent at 02/22/2015  3:54 PM EST ----- Seth Bake, Please let patient know INR is 2.9 and perfect, continue 3mg  of coumadin daily and repeat one week.

## 2015-02-22 NOTE — Progress Notes (Signed)
Janet Joyce, Please let patient know INR is 2.9 and perfect, continue 3mg  of coumadin daily and repeat one week.

## 2015-02-22 NOTE — Telephone Encounter (Signed)
Pt notified of results

## 2015-03-07 ENCOUNTER — Ambulatory Visit (INDEPENDENT_AMBULATORY_CARE_PROVIDER_SITE_OTHER): Payer: Medicare Other | Admitting: Family Medicine

## 2015-03-07 VITALS — BP 128/70 | HR 77 | Wt 170.0 lb

## 2015-03-07 DIAGNOSIS — Z951 Presence of aortocoronary bypass graft: Secondary | ICD-10-CM

## 2015-03-07 DIAGNOSIS — I482 Chronic atrial fibrillation, unspecified: Secondary | ICD-10-CM

## 2015-03-07 DIAGNOSIS — I4891 Unspecified atrial fibrillation: Secondary | ICD-10-CM | POA: Diagnosis not present

## 2015-03-07 DIAGNOSIS — Z954 Presence of other heart-valve replacement: Secondary | ICD-10-CM | POA: Diagnosis not present

## 2015-03-07 DIAGNOSIS — Z952 Presence of prosthetic heart valve: Secondary | ICD-10-CM

## 2015-03-07 LAB — PROTIME-INR
INR: 4.07 — ABNORMAL HIGH (ref <1.50)
Prothrombin Time: 39.8 seconds — ABNORMAL HIGH (ref 11.6–15.2)

## 2015-03-07 LAB — POCT INR: INR: 5.8

## 2015-03-07 NOTE — Progress Notes (Signed)
Called to inform patient:  make no changes to her coumadin regimen as long as she's not having any bleeding or brusing issues, although her fingerstick INR was high it's happened before where her venipuncture INR is actually therapeutic. Please provide her with a lab order for downstairs

## 2015-03-09 DIAGNOSIS — H1013 Acute atopic conjunctivitis, bilateral: Secondary | ICD-10-CM | POA: Diagnosis not present

## 2015-03-11 ENCOUNTER — Telehealth: Payer: Self-pay | Admitting: Family Medicine

## 2015-03-11 NOTE — Telephone Encounter (Signed)
Left message on vm

## 2015-03-11 NOTE — Telephone Encounter (Signed)
Seth Bake, Will you please let patient know that her INR was 4, a little too high.  Our records indicate that she has been taking 3mg  of coumadin daily.  If this is correct I'd recommend taking 3mg  daily except for 1.5mg  on Monday and Friday, repeat INR on Monday of next week.

## 2015-03-15 ENCOUNTER — Ambulatory Visit (INDEPENDENT_AMBULATORY_CARE_PROVIDER_SITE_OTHER): Payer: Medicare Other | Admitting: Family Medicine

## 2015-03-15 ENCOUNTER — Telehealth: Payer: Self-pay | Admitting: Family Medicine

## 2015-03-15 DIAGNOSIS — I482 Chronic atrial fibrillation, unspecified: Secondary | ICD-10-CM

## 2015-03-15 DIAGNOSIS — Z952 Presence of prosthetic heart valve: Secondary | ICD-10-CM

## 2015-03-15 DIAGNOSIS — Z951 Presence of aortocoronary bypass graft: Secondary | ICD-10-CM | POA: Diagnosis not present

## 2015-03-15 DIAGNOSIS — Z954 Presence of other heart-valve replacement: Secondary | ICD-10-CM | POA: Diagnosis not present

## 2015-03-15 LAB — PROTIME-INR
INR: 4.37 — AB (ref <1.50)
Prothrombin Time: 41.8 seconds — ABNORMAL HIGH (ref 11.6–15.2)

## 2015-03-15 LAB — POCT INR: INR: 5.9

## 2015-03-15 NOTE — Telephone Encounter (Signed)
Janet Joyce, Will you please let patient know that her INR was 4.4, a little too high. Our records indicate that she has been taking  3mg  daily except for 1.5mg  on Monday and Friday, if this is correct I'd recommend alternating 3mg  and 1.5mg  daily, repeat INR on Friday of next week.

## 2015-03-16 ENCOUNTER — Other Ambulatory Visit: Payer: Self-pay | Admitting: Family Medicine

## 2015-03-18 NOTE — Telephone Encounter (Signed)
Called Janet Joyce : INR was 4.4, a little too high. Our records indicate that she has been taking 3mg  daily except for 1.5mg  on Monday and Friday, if this is correct I'd recommend alternating 3mg  and 1.5mg  daily, repeat INR on Friday of next week.

## 2015-03-18 NOTE — Telephone Encounter (Signed)
Pt.notified

## 2015-03-19 ENCOUNTER — Telehealth: Payer: Self-pay | Admitting: Family Medicine

## 2015-03-19 NOTE — Telephone Encounter (Signed)
Received fax for pa on chlordiazepoxide-Amitriptyline sent through cover my meds waiting on auth. - CF

## 2015-03-19 NOTE — Telephone Encounter (Signed)
Received fax from [place] and Chlordiazepoxide/Amitriptyline has been approved from 12/14/2014 - 12/14/2015 - CF

## 2015-03-22 ENCOUNTER — Ambulatory Visit (INDEPENDENT_AMBULATORY_CARE_PROVIDER_SITE_OTHER): Payer: Medicare Other | Admitting: Family Medicine

## 2015-03-22 ENCOUNTER — Other Ambulatory Visit: Payer: Self-pay | Admitting: *Deleted

## 2015-03-22 VITALS — BP 117/75 | HR 71 | Wt 173.0 lb

## 2015-03-22 DIAGNOSIS — Z951 Presence of aortocoronary bypass graft: Secondary | ICD-10-CM | POA: Diagnosis not present

## 2015-03-22 DIAGNOSIS — M549 Dorsalgia, unspecified: Principal | ICD-10-CM

## 2015-03-22 DIAGNOSIS — G8929 Other chronic pain: Secondary | ICD-10-CM

## 2015-03-22 DIAGNOSIS — I482 Chronic atrial fibrillation, unspecified: Secondary | ICD-10-CM

## 2015-03-22 DIAGNOSIS — I4891 Unspecified atrial fibrillation: Secondary | ICD-10-CM | POA: Diagnosis not present

## 2015-03-22 DIAGNOSIS — Z954 Presence of other heart-valve replacement: Secondary | ICD-10-CM

## 2015-03-22 DIAGNOSIS — Z952 Presence of prosthetic heart valve: Secondary | ICD-10-CM

## 2015-03-22 LAB — POCT INR: INR: 3.6

## 2015-03-22 MED ORDER — OXYCODONE-ACETAMINOPHEN 10-325 MG PO TABS: 1.0000 | ORAL_TABLET | Freq: Four times a day (QID) | ORAL | Status: DC | PRN

## 2015-03-22 NOTE — Progress Notes (Signed)
While in office, Pt requesting refill on her pain medication. This Rx was printed and Pt took to drop off at her pharmacy. Pt also has a bump on her face that has been present over last 2 nurse INR visits. Advised Pt to schedule appt with Dr. Ileene Rubens for evaluation. Verbalized understanding.

## 2015-03-23 LAB — PROTIME-INR
INR: 2.64 — ABNORMAL HIGH (ref <1.50)
PROTHROMBIN TIME: 28.2 s — AB (ref 11.6–15.2)

## 2015-03-25 ENCOUNTER — Ambulatory Visit (INDEPENDENT_AMBULATORY_CARE_PROVIDER_SITE_OTHER): Payer: Medicare Other | Admitting: Family Medicine

## 2015-03-25 ENCOUNTER — Encounter: Payer: Self-pay | Admitting: Family Medicine

## 2015-03-25 VITALS — BP 128/71 | HR 75 | Wt 173.0 lb

## 2015-03-25 DIAGNOSIS — N390 Urinary tract infection, site not specified: Secondary | ICD-10-CM

## 2015-03-25 DIAGNOSIS — R300 Dysuria: Secondary | ICD-10-CM

## 2015-03-25 DIAGNOSIS — I482 Chronic atrial fibrillation, unspecified: Secondary | ICD-10-CM

## 2015-03-25 DIAGNOSIS — L989 Disorder of the skin and subcutaneous tissue, unspecified: Secondary | ICD-10-CM | POA: Diagnosis not present

## 2015-03-25 DIAGNOSIS — R609 Edema, unspecified: Secondary | ICD-10-CM | POA: Diagnosis not present

## 2015-03-25 LAB — POCT URINALYSIS DIPSTICK
Bilirubin, UA: NEGATIVE
Glucose, UA: NEGATIVE
Ketones, UA: NEGATIVE
Nitrite, UA: POSITIVE
PH UA: 5
PROTEIN UA: NEGATIVE
UROBILINOGEN UA: 0.2

## 2015-03-25 MED ORDER — FUROSEMIDE 20 MG PO TABS: ORAL_TABLET | ORAL | Status: DC

## 2015-03-25 MED ORDER — SULFAMETHOXAZOLE-TRIMETHOPRIM 800-160 MG PO TABS: ORAL_TABLET | ORAL | Status: DC

## 2015-03-25 NOTE — Progress Notes (Signed)
CC: Janet Joyce is a 59 y.o. female is here for right lower lid puffy x 1 month and clarify coumadin instructions   Subjective: HPI:  Follow-up atrophic fibrillation: She's been taking Coumadin 1.5 mg and 3 mg every other day since I saw her last.  Venipuncture INR from Friday 2.6. No new bleeding or motor or sensory disturbances  Complains of facial swelling on the right cheek that's been present for the last month on a daily basis. It fluctuates throughout the day but seems to be worse first thing in the morning. She was prescribed cromolyn eyedrops last week but has had no benefit from these. She also takes an allergy medication on a daily basis by mouth and it has not influenced the swelling. It's sore but not painful in any other way.  She does not think that her urinary habits of changed all  And is not certain about whether or not she's been experiencing dysuria. She's also noticed that  Lower extremity swelling has been experienced along the same timeframe as the swelling in her face. She would like to know she should see a dermatologist for this.    Review Of Systems Outlined In HPI  Past Medical History  Diagnosis Date  . Heart attack   . Hyperlipidemia   . Depression   . CHF (congestive heart failure)     No past surgical history on file. Family History  Problem Relation Age of Onset  . Diabetes Father     History   Social History  . Marital Status: Divorced    Spouse Name: N/A  . Number of Children: N/A  . Years of Education: N/A   Occupational History  . Not on file.   Social History Main Topics  . Smoking status: Current Every Day Smoker -- 0.50 packs/day    Types: Cigarettes  . Smokeless tobacco: Not on file  . Alcohol Use: No  . Drug Use: No  . Sexual Activity: No   Other Topics Concern  . Not on file   Social History Narrative     Objective: BP 128/71 mmHg  Pulse 75  Wt 173 lb (78.472 kg)  General: Alert and Oriented, No Acute  Distress HEENT: Pupils equal, round, reactive to light. Conjunctivae clear.  External ears unremarkable, canals clear with intact TMs with appropriate landmarks.  Middle ear appears open without effusion. Pink inferior turbinates.  Moist mucous membranes, pharynx without inflammation nor lesions.  Neck supple without palpable lymphadenopathy nor abnormal masses.  Fluid-filled soft tissue underneath the right eye moderate in severity Lungs: Clear to auscultation bilaterally, no wheezing/ronchi/rales.  Comfortable work of breathing. Good air movement. Cardiac:  Irregularly irregular rhythm less than 100 bpm Extremities:  1+ pitting edema from the shins distally.  Strong peripheral pulses.  Mental Status: No depression, anxiety, nor agitation. Skin: Warm and dry.  Assessment & Plan: Lajoyce was seen today for right lower lid puffy x 1 month and clarify coumadin instructions.  Diagnoses and all orders for this visit:  Chronic atrial fibrillation  Facial lesion Orders: -     Ambulatory referral to Dermatology  Edema Orders: -     furosemide (LASIX) 20 MG tablet; One to two times a day (6 hours apart) as needed for swelling.  Dysuria Orders: -     Urinalysis Dipstick -     Urine Culture  UTI (lower urinary tract infection)  Other orders -     sulfamethoxazole-trimethoprim (SEPTRA DS) 800-160 MG per tablet; One tab twice  a day for five days.    initial fibrillation: Controlled, advised to take Coumadin at a regimen of 3 mg and 1.5 mg alternating every other day repeat INR one week  Edema: Begin furosemide  Dysuria: A urinalysis was obtained primarily to look for protein  In the urine and rule out nephrotic syndrome fortunately no protein is seen but there is  High suspicion for UTI therefore  Bactrim has been provided.   Per her request a referral has been placed to dermatology should Lasix not improve her facial swelling.  Return in about 1 week (around 04/01/2015).

## 2015-03-25 NOTE — Patient Instructions (Signed)
Coumadin alternating 3mg  and 1.5mg  daily

## 2015-03-28 ENCOUNTER — Telehealth: Payer: Self-pay | Admitting: Family Medicine

## 2015-03-28 LAB — URINE CULTURE: Colony Count: >=100000

## 2015-03-28 MED ORDER — NITROFURANTOIN MONOHYD MACRO 100 MG PO CAPS: 100.0000 mg | ORAL_CAPSULE | Freq: Two times a day (BID) | ORAL | Status: AC

## 2015-03-28 NOTE — Telephone Encounter (Signed)
Pt called. She is having a reaction to antibiotic.

## 2015-03-28 NOTE — Telephone Encounter (Signed)
Andre,a Will you please let patient know that her urine culture confirmed a UTI and that her antibiotic is not going to be fully effective at treating this infection. I'd recommend she swtich to nitrofurantoin which I've sent to [facility].  Hold on to the sulfa-trimethoprim Rx since we might be able to use this for any other infection in the future.

## 2015-03-28 NOTE — Telephone Encounter (Signed)
Pt notified of results. The septra caused nausea and vomiting. Septra added to intolerance/allergy list. Pt aware to stop the septra and start new abx

## 2015-04-03 ENCOUNTER — Ambulatory Visit (INDEPENDENT_AMBULATORY_CARE_PROVIDER_SITE_OTHER): Payer: Medicare Other | Admitting: Family Medicine

## 2015-04-03 ENCOUNTER — Telehealth: Payer: Self-pay | Admitting: *Deleted

## 2015-04-03 VITALS — BP 131/78 | HR 73 | Wt 170.0 lb

## 2015-04-03 DIAGNOSIS — I482 Chronic atrial fibrillation, unspecified: Secondary | ICD-10-CM

## 2015-04-03 DIAGNOSIS — Z952 Presence of prosthetic heart valve: Secondary | ICD-10-CM

## 2015-04-03 DIAGNOSIS — Z954 Presence of other heart-valve replacement: Secondary | ICD-10-CM | POA: Diagnosis not present

## 2015-04-03 DIAGNOSIS — I4891 Unspecified atrial fibrillation: Secondary | ICD-10-CM | POA: Diagnosis not present

## 2015-04-03 DIAGNOSIS — Z951 Presence of aortocoronary bypass graft: Secondary | ICD-10-CM | POA: Diagnosis not present

## 2015-04-03 LAB — POCT INR: INR: 4.6

## 2015-04-03 NOTE — Progress Notes (Signed)
Seth Bake, Will you please let patient know that her INR was elevated, I suspect this was from her recent antibiotic use.  I'd recommend she skip a day of coumadin then resume alternating 3mg  and 1.5mg  every other day. Repeat INR Wednesday next week.

## 2015-04-03 NOTE — Telephone Encounter (Signed)
Pt.notified

## 2015-04-03 NOTE — Telephone Encounter (Signed)
-----   Message from Marcial Pacas, DO sent at 04/03/2015  4:56 PM EDT ----- Seth Bake, Will you please let patient know that her INR was elevated, I suspect this was from her recent antibiotic use.  I'd recommend she skip a day of coumadin then resume alternating 3mg  and 1.5mg  every other day. Repeat INR Wednesday next week.

## 2015-04-04 LAB — PROTIME-INR
INR: 3.82 — ABNORMAL HIGH (ref <1.50)
PROTHROMBIN TIME: 37.6 s — AB (ref 11.6–15.2)

## 2015-04-08 ENCOUNTER — Other Ambulatory Visit: Payer: Self-pay | Admitting: Family Medicine

## 2015-04-10 ENCOUNTER — Ambulatory Visit (INDEPENDENT_AMBULATORY_CARE_PROVIDER_SITE_OTHER): Payer: Medicare Other | Admitting: Family Medicine

## 2015-04-10 ENCOUNTER — Encounter: Payer: Self-pay | Admitting: Family Medicine

## 2015-04-10 VITALS — BP 92/65 | HR 76 | Temp 97.9°F | Ht 63.0 in | Wt 172.0 lb

## 2015-04-10 DIAGNOSIS — E669 Obesity, unspecified: Secondary | ICD-10-CM

## 2015-04-10 DIAGNOSIS — R300 Dysuria: Secondary | ICD-10-CM | POA: Diagnosis not present

## 2015-04-10 DIAGNOSIS — Z954 Presence of other heart-valve replacement: Secondary | ICD-10-CM | POA: Diagnosis not present

## 2015-04-10 DIAGNOSIS — Z952 Presence of prosthetic heart valve: Secondary | ICD-10-CM

## 2015-04-10 LAB — POCT INR: INR: 2.3

## 2015-04-10 MED ORDER — AMOXICILLIN-POT CLAVULANATE 500-125 MG PO TABS: ORAL_TABLET | ORAL | Status: AC

## 2015-04-10 MED ORDER — PHENTERMINE HCL 37.5 MG PO CAPS: 37.5000 mg | ORAL_CAPSULE | ORAL | Status: DC

## 2015-04-10 NOTE — Progress Notes (Signed)
CC: Janet Joyce is a 59 y.o. female is here for Atrial Fibrillation and Urinary Tract Infection   Subjective: HPI:  Aortic valve replacement with atrophic ablation: She's been taking Coumadin 3 mg alternating with 1.5 mg every other day. No bleeding bruising or motor or sensory disturbances. Denies chest pain or shortness of breath.  Complains of dysuria that resolved after taking nitroglycerin however it returned within 2 or 3 days. It's malodorous and painful to urinate. Pain is nonradiating. No interventions as of yet. Denies fevers, chills, flank pain or gastrointestinal complaints. No other genitourinary complaints  Complaints of difficulty losing weight and she believes it is primarily influenced by her inability to say no to snacking and sugary beverages. She's tried her hardest in the past few months to reduce consumption of this however she admits that she's failed at this. She wants another something she can take to help with weight loss.  Review Of Systems Outlined In HPI  Past Medical History  Diagnosis Date  . Heart attack   . Hyperlipidemia   . Depression   . CHF (congestive heart failure)     No past surgical history on file. Family History  Problem Relation Age of Onset  . Diabetes Father     History   Social History  . Marital Status: Divorced    Spouse Name: N/A  . Number of Children: N/A  . Years of Education: N/A   Occupational History  . Not on file.   Social History Main Topics  . Smoking status: Current Every Day Smoker -- 0.50 packs/day    Types: Cigarettes  . Smokeless tobacco: Not on file  . Alcohol Use: No  . Drug Use: No  . Sexual Activity: No   Other Topics Concern  . Not on file   Social History Narrative     Objective: BP 92/65 mmHg  Pulse 76  Temp(Src) 97.9 F (36.6 C) (Oral)  Ht 5\' 3"  (1.6 m)  Wt 172 lb (78.019 kg)  BMI 30.48 kg/m2  SpO2 94%  General: Alert and Oriented, No Acute Distress HEENT: Pupils equal, round,  reactive to light. Conjunctivae clear.  Moist mucous membranes Lungs: Clear to auscultation bilaterally, no wheezing/ronchi/rales.  Comfortable work of breathing. Good air movement. Cardiac: Irregularly irregular rhythm less than 100 bpm with a mechanical S2. No rubs or gallops Extremities: No peripheral edema.  Strong peripheral pulses.  Mental Status: No depression, anxiety, nor agitation. Skin: Warm and dry.  Assessment & Plan: Janet Joyce was seen today for atrial fibrillation and urinary tract infection.  Diagnoses and all orders for this visit:  H/O aortic valve replacement Orders: -     POCT INR  Dysuria Orders: -     Cancel: POCT Urinalysis Dipstick -     Cancel: Urine Culture -     amoxicillin-clavulanate (AUGMENTIN) 500-125 MG per tablet; Take one by mouth every 8 hours for ten total days.  Obesity Orders: -     phentermine 37.5 MG capsule; Take 1 capsule (37.5 mg total) by mouth every morning.   Aortic valve replacement: Slightly subtherapeutic, continue current regimen and return in 3-4 days after starting Augmentin and phentermine for recheck. UTI: Based on recent urine culture Augmentin should be effective. She was unable to provide a urine sample today and could not wait around therefore she can start Augmentin immediately and return for urine sample if no improvement in 2 days. Obesity: Discussed risks and benefits of phentermine especially possibility of anxiety worsening, she would like  to take this risk and try daily phentermine to help with appetite suppression. Discussed that this will only prescribed if she is continuously losing weight.  Return in about 2 weeks (around 04/24/2015) for INR check.

## 2015-04-11 DIAGNOSIS — R21 Rash and other nonspecific skin eruption: Secondary | ICD-10-CM | POA: Diagnosis not present

## 2015-04-24 ENCOUNTER — Ambulatory Visit (INDEPENDENT_AMBULATORY_CARE_PROVIDER_SITE_OTHER): Payer: Medicare Other | Admitting: Family Medicine

## 2015-04-24 ENCOUNTER — Encounter: Payer: Self-pay | Admitting: Family Medicine

## 2015-04-24 VITALS — BP 143/74 | HR 72 | Wt 172.0 lb

## 2015-04-24 DIAGNOSIS — M549 Dorsalgia, unspecified: Principal | ICD-10-CM

## 2015-04-24 DIAGNOSIS — Z952 Presence of prosthetic heart valve: Secondary | ICD-10-CM

## 2015-04-24 DIAGNOSIS — I482 Chronic atrial fibrillation, unspecified: Secondary | ICD-10-CM

## 2015-04-24 DIAGNOSIS — G8929 Other chronic pain: Secondary | ICD-10-CM

## 2015-04-24 DIAGNOSIS — Z951 Presence of aortocoronary bypass graft: Secondary | ICD-10-CM

## 2015-04-24 LAB — POCT INR: INR: 3.4

## 2015-04-24 MED ORDER — OXYCODONE-ACETAMINOPHEN 10-325 MG PO TABS: 1.0000 | ORAL_TABLET | Freq: Four times a day (QID) | ORAL | Status: DC | PRN

## 2015-04-24 NOTE — Progress Notes (Signed)
INR at goal repeat INR one month, no change to meds

## 2015-04-25 NOTE — Progress Notes (Signed)
Pt.notified

## 2015-05-07 ENCOUNTER — Other Ambulatory Visit: Payer: Self-pay | Admitting: Family Medicine

## 2015-05-22 ENCOUNTER — Ambulatory Visit (INDEPENDENT_AMBULATORY_CARE_PROVIDER_SITE_OTHER): Payer: Medicare Other | Admitting: Family Medicine

## 2015-05-22 ENCOUNTER — Encounter: Payer: Self-pay | Admitting: Family Medicine

## 2015-05-22 VITALS — BP 132/80 | HR 82 | Wt 171.0 lb

## 2015-05-22 DIAGNOSIS — R911 Solitary pulmonary nodule: Secondary | ICD-10-CM | POA: Diagnosis not present

## 2015-05-22 DIAGNOSIS — Z952 Presence of prosthetic heart valve: Secondary | ICD-10-CM

## 2015-05-22 DIAGNOSIS — Z954 Presence of other heart-valve replacement: Secondary | ICD-10-CM | POA: Diagnosis not present

## 2015-05-22 DIAGNOSIS — J439 Emphysema, unspecified: Secondary | ICD-10-CM | POA: Diagnosis not present

## 2015-05-22 MED ORDER — TIOTROPIUM BROMIDE MONOHYDRATE 2.5 MCG/ACT IN AERS: INHALATION_SPRAY | RESPIRATORY_TRACT | Status: DC

## 2015-05-22 NOTE — Progress Notes (Signed)
CC: Janet Joyce is a 59 y.o. female is here for Shortness of Breath   Subjective: HPI:   Follow-up atrial fibrillation with aortic valve replacement: Currently taking Coumadin at a alternating regimen of 3 mg or 1.5 mg every other day. Denies irregular heartbeat, motor or sensory disturbances or chest pain. She's had some shortness of breath over the past 1 or 2 weeks ever since her air conditioning broke and she only has 1 window unit in her house currently placed in her bedroom.if she leaves her bedroom she will be exposed to humid indoor air that can be as high as 85. When this happens she gets a sensation that she is being smothered and has difficulty getting deep breaths. This is lead to some panic attacks. Nothing else seems to contribute to these symptoms. Albuterol is only mildly beneficial. Symptoms are almost absent if she stays in her air conditioned room. She's had some cough but she states it's only a little bit worse from her baseline. She denies any blood in sputum or wheezing.  Review Of Systems Outlined In HPI  Past Medical History  Diagnosis Date  . Heart attack   . Hyperlipidemia   . Depression   . CHF (congestive heart failure)     No past surgical history on file. Family History  Problem Relation Age of Onset  . Diabetes Father     History   Social History  . Marital Status: Divorced    Spouse Name: N/A  . Number of Children: N/A  . Years of Education: N/A   Occupational History  . Not on file.   Social History Main Topics  . Smoking status: Current Every Day Smoker -- 0.50 packs/day    Types: Cigarettes  . Smokeless tobacco: Not on file  . Alcohol Use: No  . Drug Use: No  . Sexual Activity: No   Other Topics Concern  . Not on file   Social History Narrative     Objective: BP 132/80 mmHg  Pulse 82  Wt 171 lb (77.565 kg)  General: Alert and Oriented, No Acute Distress HEENT: Pupils equal, round, reactive to light. Conjunctivae clear.   Moist because membranes are Unremarkable Lungs: Clear to auscultation bilaterally, no wheezing/ronchi/rales.  Comfortable work of breathing. Good air movement. Cardiac: Regular rate and rhythm. Mechanical S2.  No murmurs, rubs, nor gallops.   Extremities: No peripheral edema.  Strong peripheral pulses.  Mental Status: No depression, anxiety, nor agitation. Skin: Warm and dry.  Assessment & Plan: Janet Joyce was seen today for shortness of breath.  Diagnoses and all orders for this visit:  H/O aortic valve replacement  Solitary pulmonary nodule Orders: -     CT Chest W Contrast; Future  Pulmonary emphysema, unspecified emphysema type  Other orders -     Tiotropium Bromide Monohydrate (SPIRIVA RESPIMAT) 2.5 MCG/ACT AERS; Two puffs daily.   Atrial fibrillation with aortic valve replacement: Supratherapeutic today, reducing Coumadin to 1.5mg  daily now,repeat INR one week She is now due for her 6 month follow-up for surveillance of a single pulmonary nodule. Orders for CT scan of the placed Shortness of breath is likely due to her emphysema and therefore she was given a sample of Spiriva today, if this is providing her any benefit call for a formal prescription. I will try to get her a used additional window ac unit for her house.  Return in about 1 week (around 05/29/2015).

## 2015-05-23 ENCOUNTER — Other Ambulatory Visit: Payer: Self-pay | Admitting: *Deleted

## 2015-05-23 DIAGNOSIS — G8929 Other chronic pain: Secondary | ICD-10-CM

## 2015-05-23 DIAGNOSIS — M549 Dorsalgia, unspecified: Principal | ICD-10-CM

## 2015-05-23 MED ORDER — OXYCODONE-ACETAMINOPHEN 10-325 MG PO TABS
1.0000 | ORAL_TABLET | Freq: Four times a day (QID) | ORAL | Status: DC | PRN
Start: 2015-05-23 — End: 2015-06-20

## 2015-05-31 ENCOUNTER — Ambulatory Visit (INDEPENDENT_AMBULATORY_CARE_PROVIDER_SITE_OTHER): Payer: Medicare Other | Admitting: Family Medicine

## 2015-05-31 VITALS — BP 133/65 | HR 66 | Wt 172.0 lb

## 2015-05-31 DIAGNOSIS — Z954 Presence of other heart-valve replacement: Secondary | ICD-10-CM

## 2015-05-31 DIAGNOSIS — I482 Chronic atrial fibrillation, unspecified: Secondary | ICD-10-CM

## 2015-05-31 DIAGNOSIS — Z951 Presence of aortocoronary bypass graft: Secondary | ICD-10-CM

## 2015-05-31 DIAGNOSIS — Z952 Presence of prosthetic heart valve: Secondary | ICD-10-CM

## 2015-05-31 LAB — POCT INR: INR: 1.6

## 2015-05-31 MED ORDER — WARFARIN SODIUM 2 MG PO TABS: ORAL_TABLET | ORAL | Status: DC

## 2015-05-31 NOTE — Progress Notes (Signed)
Pt notified of dosage change and new Rx. Pt will call us back to schedule nurse visit.

## 2015-05-31 NOTE — Progress Notes (Signed)
Ebony/Triage, Will you please let patient know that her INR was 1.6 and below our goal.  I'd recommend she increase her coumadin regimen to 2mg  daily and repeat INR one week.  Rx sent to her pharmacy.

## 2015-06-06 ENCOUNTER — Ambulatory Visit (INDEPENDENT_AMBULATORY_CARE_PROVIDER_SITE_OTHER): Payer: Medicare Other | Admitting: Family Medicine

## 2015-06-06 VITALS — BP 132/74 | Wt 172.0 lb

## 2015-06-06 DIAGNOSIS — Z954 Presence of other heart-valve replacement: Secondary | ICD-10-CM

## 2015-06-06 DIAGNOSIS — Z951 Presence of aortocoronary bypass graft: Secondary | ICD-10-CM | POA: Diagnosis not present

## 2015-06-06 DIAGNOSIS — I482 Chronic atrial fibrillation, unspecified: Secondary | ICD-10-CM

## 2015-06-06 DIAGNOSIS — Z952 Presence of prosthetic heart valve: Secondary | ICD-10-CM

## 2015-06-06 LAB — POCT INR: INR: 2.2

## 2015-06-07 NOTE — Progress Notes (Signed)
   Subjective:    Patient ID: Janet Joyce, female    DOB: Sep 28, 1956, 59 y.o.   MRN: [id number] [facility] notifying pt of new dosing instructions and to repeat INR check in one week. Beatris Ship, CMA HPI    Review of Systems     Objective:   Physical Exam        Assessment & Plan:

## 2015-06-07 NOTE — Progress Notes (Signed)
Amber will you please let patient know that her INR was slightly low but improving, I'd recommend adding an additional 1mg  tablet on M/W/F ontop of her daily 2mg  tablet daily. Repeat inr one week.

## 2015-06-14 ENCOUNTER — Ambulatory Visit (INDEPENDENT_AMBULATORY_CARE_PROVIDER_SITE_OTHER): Payer: Medicare Other | Admitting: Family Medicine

## 2015-06-14 DIAGNOSIS — I4891 Unspecified atrial fibrillation: Secondary | ICD-10-CM | POA: Diagnosis not present

## 2015-06-14 LAB — POCT INR: INR: 2.7

## 2015-06-14 NOTE — Progress Notes (Signed)
Seth Bake will you please let patient know that her INR was in the therapeutic range, continue current Coumadin regimen, repeat in one week and if in the therapeutic range we can space this out to monthly.

## 2015-06-15 ENCOUNTER — Other Ambulatory Visit: Payer: Self-pay | Admitting: Family Medicine

## 2015-06-18 ENCOUNTER — Telehealth: Payer: Self-pay | Admitting: *Deleted

## 2015-06-18 MED ORDER — TRAMADOL HCL 50 MG PO TABS: 50.0000 mg | ORAL_TABLET | Freq: Three times a day (TID) | ORAL | Status: DC | PRN

## 2015-06-18 NOTE — Telephone Encounter (Signed)
Received refill request for Limbitrol. Dr. Ileene Rubens has rx'ed this med in the past but really her psychiatrist needs to be managing this medication. Dr. Ileene Rubens has not seen pt for anxiety or depression since 2014. I called and asked pt if she was still seeing a psychiatrist and she says that she is. I advised that her anxiety/depression meds should be filled by him since he is managing her care and [facility] has not seen and evaluated her for this since she established care in 2014. In that note it stated that care for her mood would be deferred to psychiatrist.Pt agreed and voiced understanding

## 2015-06-18 NOTE — Telephone Encounter (Signed)
-----   Message from Marcial Pacas, [place] sent at 06/14/2015  5:36 PM EDT ----- Janet Joyce will you please let patient know that her INR was in the therapeutic range, continue current Coumadin regimen, repeat in one week and if in the therapeutic range we can space this out to monthly.

## 2015-06-18 NOTE — Telephone Encounter (Signed)
Pt.notified

## 2015-06-20 ENCOUNTER — Ambulatory Visit (INDEPENDENT_AMBULATORY_CARE_PROVIDER_SITE_OTHER): Payer: Medicare Other | Admitting: Sports Medicine

## 2015-06-20 ENCOUNTER — Other Ambulatory Visit: Payer: Self-pay | Admitting: *Deleted

## 2015-06-20 VITALS — BP 126/74 | HR 80 | Wt 173.0 lb

## 2015-06-20 DIAGNOSIS — M549 Dorsalgia, unspecified: Principal | ICD-10-CM

## 2015-06-20 DIAGNOSIS — G8929 Other chronic pain: Secondary | ICD-10-CM

## 2015-06-20 DIAGNOSIS — I482 Chronic atrial fibrillation, unspecified: Secondary | ICD-10-CM

## 2015-06-20 DIAGNOSIS — Z954 Presence of other heart-valve replacement: Secondary | ICD-10-CM

## 2015-06-20 DIAGNOSIS — Z951 Presence of aortocoronary bypass graft: Secondary | ICD-10-CM

## 2015-06-20 DIAGNOSIS — Z952 Presence of prosthetic heart valve: Secondary | ICD-10-CM

## 2015-06-20 LAB — POCT INR: INR: 3.4

## 2015-06-20 MED ORDER — OXYCODONE-ACETAMINOPHEN 10-325 MG PO TABS: 1.0000 | ORAL_TABLET | Freq: Four times a day (QID) | ORAL | Status: DC | PRN

## 2015-06-20 NOTE — Progress Notes (Signed)
Advised Pt of dosage recommendation, does verify having a [employer] mechanical heart value. Pt advised to return in one month, states she will call later to schedule.

## 2015-06-21 ENCOUNTER — Ambulatory Visit (HOSPITAL_BASED_OUTPATIENT_CLINIC_OR_DEPARTMENT_OTHER): Payer: Medicare Other

## 2015-06-26 ENCOUNTER — Ambulatory Visit (HOSPITAL_BASED_OUTPATIENT_CLINIC_OR_DEPARTMENT_OTHER)
Admission: RE | Admit: 2015-06-26 | Discharge: 2015-06-26 | Disposition: A | Discharge status: Home / Self Care | Payer: Medicare Other | Source: Ambulatory Visit | Attending: Family Medicine | Admitting: Family Medicine

## 2015-06-26 ENCOUNTER — Encounter (HOSPITAL_BASED_OUTPATIENT_CLINIC_OR_DEPARTMENT_OTHER): Payer: Self-pay

## 2015-06-26 DIAGNOSIS — I517 Cardiomegaly: Secondary | ICD-10-CM | POA: Diagnosis not present

## 2015-06-26 DIAGNOSIS — R911 Solitary pulmonary nodule: Secondary | ICD-10-CM

## 2015-06-26 DIAGNOSIS — R918 Other nonspecific abnormal finding of lung field: Secondary | ICD-10-CM | POA: Diagnosis not present

## 2015-06-26 MED ORDER — IOHEXOL 300 MG/ML  SOLN
80.0000 mL | Freq: Once | INTRAMUSCULAR | Status: AC | PRN
  Administered 2015-06-26: 75 mL via INTRAVENOUS

## 2015-07-03 ENCOUNTER — Encounter: Payer: Self-pay | Admitting: Family Medicine

## 2015-07-03 ENCOUNTER — Ambulatory Visit (INDEPENDENT_AMBULATORY_CARE_PROVIDER_SITE_OTHER): Payer: Medicare Other | Admitting: Family Medicine

## 2015-07-03 VITALS — BP 132/82 | HR 86 | Temp 98.0°F | Wt 170.0 lb

## 2015-07-03 DIAGNOSIS — J449 Chronic obstructive pulmonary disease, unspecified: Secondary | ICD-10-CM

## 2015-07-03 DIAGNOSIS — Z7901 Long term (current) use of anticoagulants: Secondary | ICD-10-CM

## 2015-07-03 DIAGNOSIS — J209 Acute bronchitis, unspecified: Secondary | ICD-10-CM | POA: Diagnosis not present

## 2015-07-03 DIAGNOSIS — R791 Abnormal coagulation profile: Secondary | ICD-10-CM

## 2015-07-03 LAB — POCT INR: INR: 4.7

## 2015-07-03 MED ORDER — GUAIFENESIN-CODEINE 100-10 MG/5ML PO SOLN: 5.0000 mL | Freq: Every evening | ORAL | Status: DC | PRN

## 2015-07-03 MED ORDER — DOXYCYCLINE HYCLATE 50 MG PO CAPS: 50.0000 mg | ORAL_CAPSULE | Freq: Two times a day (BID) | ORAL | Status: DC

## 2015-07-03 MED ORDER — IPRATROPIUM-ALBUTEROL 0.5-2.5 (3) MG/3ML IN SOLN
3.0000 mL | Freq: Once | RESPIRATORY_TRACT | Status: AC
  Administered 2015-07-03: 3 mL via RESPIRATORY_TRACT

## 2015-07-03 MED ORDER — PREDNISONE 50 MG PO TABS: 50.0000 mg | ORAL_TABLET | Freq: Every day | ORAL | Status: DC

## 2015-07-03 MED ORDER — ALBUTEROL SULFATE HFA 108 (90 BASE) MCG/ACT IN AERS: INHALATION_SPRAY | RESPIRATORY_TRACT | Status: DC

## 2015-07-03 NOTE — Progress Notes (Signed)
Janet Joyce is a 59 y.o. female who presents to [place]  today for cough congestion runny nose and wheezing associated with right-sided ear pain. Symptoms present for one week. No treatment tried. No fevers chills nausea vomiting or diarrhea. She notes mild shortness of breath. Symptoms are somewhat consistent with bronchitis. She currently continues to smoke.  Additionally patient awoke this morning with some blood in her mouth. The bleeding has stopped. She takes warfarin because she has an artificial heart valve with a INR goal between 2.5 and 3.5. She takes 2 mg and 3 mg alternating of warfarin daily.    Past Medical History  Diagnosis Date  . Heart attack   . Hyperlipidemia   . Depression   . CHF (congestive heart failure)    No past surgical history on file. History  Substance Use Topics  . Smoking status: Current Every Day Smoker -- 0.50 packs/day    Types: Cigarettes  . Smokeless tobacco: Not on file  . Alcohol Use: No   ROS as above Medications: Current Outpatient Prescriptions  Medication Sig Dispense Refill  . albuterol (PROVENTIL HFA;VENTOLIN HFA) 108 (90 BASE) MCG/ACT inhaler Inhale two puffs every 4-6 hours only as needed for shortness of breath or wheezing. 1 Inhaler 1  . ARIPiprazole (ABILIFY PO) Take by mouth.    [name] Kitchen azelastine (OPTIVAR) 0.05 % ophthalmic solution Place 1 drop into both eyes 2 (two) times daily. 6 mL 12  . carvedilol (COREG) 3.125 MG tablet TAKE 1 TABLET BY MOUTH TWICE DAILY 60 tablet 5  . chloridazePOXIDE-amitriptyline (LIMBITROL) 5-12.5 MG per tablet TAKE 1 TABLET BY MOUTH THREE TIMES DAILY AS NEEDED FOR ANXIETY OR DEPRESSION 90 tablet 0  . clonazePAM (KLONOPIN) 0.5 MG tablet 1 PO TID and 2 QHS 150 tablet 0  . ezetimibe (ZETIA) 10 MG tablet Take 1 tablet (10 mg total) by mouth daily. 1 tablet 0  . furosemide (LASIX) 20 MG tablet One to two times a day (6 hours apart) as needed for swelling. 60 tablet 1  .  Ipratropium-Albuterol (COMBIVENT RESPIMAT) 20-100 MCG/ACT AERS respimat Inhale 1 puff into the lungs every 6 (six) hours. 1 Inhaler 0  . isosorbide mononitrate (IMDUR) 30 MG 24 hr tablet Take 1 tablet (30 mg total) by mouth daily. 1 tablet 0  . loratadine (CLARITIN) 10 MG tablet Take 10 mg by mouth daily.    . mometasone (NASONEX) 50 MCG/ACT nasal spray Two sprays each nostril daily. 17 g 0  . nystatin (MYCOSTATIN/NYSTOP) 100000 UNIT/GM POWD Apply twice a day for up to one week after resolution of rash. 30 g 1  . omeprazole (PRILOSEC) 40 MG capsule One by mouth daily at least one hour before a meal. 30 capsule 2  . oxyCODONE-acetaminophen (PERCOCET) 10-325 MG per tablet Take 1 tablet by mouth every 6 (six) hours as needed for pain. 120 tablet 0  . phentermine 37.5 MG capsule Take 1 capsule (37.5 mg total) by mouth every morning. 30 capsule 0  . potassium chloride SA (K-DUR,KLOR-CON) 20 MEQ tablet TAKE 2 TABLETS BY MOUTH DAILY 180 tablet 3  . rosuvastatin (CRESTOR) 40 MG tablet Take 1 tablet (40 mg total) by mouth daily. 90 tablet 3  . sertraline (ZOLOFT) 100 MG tablet Take 100 mg by mouth daily.    . Tiotropium Bromide Monohydrate (SPIRIVA RESPIMAT) 2.5 MCG/ACT AERS Two puffs daily. 1 Inhaler 0  . traMADol (ULTRAM) 50 MG tablet Take 1 tablet (50 mg total) by mouth every 8 (eight)  hours as needed. for pain 90 tablet 2  . traZODone (DESYREL) 50 MG tablet Take 50 mg by mouth at bedtime.    [name] Kitchen warfarin (COUMADIN) 2 MG tablet Full tablet daily. 30 tablet 2   No current facility-administered medications for this visit.   Allergies  Allergen Reactions  . Ciprofloxacin   . Erythrocin [Erythromycin]   . Hydromet [Hydrocodone-Homatropine]   . Keflex [Cephalexin]   . Lipitor [Atorvastatin]     Oral ulcers  . Morphine And Related   . Niacin And Related     Intolerable Flushing  . Septra [Sulfamethoxazole-Trimethoprim] Nausea And Vomiting and Other (See Comments)     Exam:  BP 132/82 mmHg  Pulse  86  Temp(Src) 98 F (36.7 C) (Oral)  Wt 170 lb (77.111 kg) Gen: Well NAD HEENT: EOMI,  MMM partial cerumen impaction right ear canal. The tympanic membranes relatively normal appearing hair the left is also normal appearing. No oral lesions visible. Lungs: Normal work of breathing. Wheezing prolonged expiratory phase and coarse breath sounds present bilaterally. Heart: RRR no MRG Abd: NABS, Soft. Nondistended, Nontender Exts: Brisk capillary refill, warm and well perfused.   Patient was given a 2.5/0.5 mg DuoNeb nebulizer treatment, and she felt much better  Patient was not able to tolerate cerumen irrigation.  Results for orders placed or performed in visit on 07/03/15 (from the past 24 hour(s))  POCT INR     Status: Abnormal   Collection Time: 07/03/15  2:22 PM  Result Value Ref Range   INR 4.7    No results found.   Please see individual assessment and plan sections.

## 2015-07-03 NOTE — Patient Instructions (Signed)
Thank you for coming in today. 1) CHANGE Warfarin. Take 3mg  on Wednesday and Sunday. Take 2mg  every other day. Return in 1 week for INR recheck  2) Bronchitis. Take prednisone and doxycycline. Use albuterol. Use codeine cough syrup as needed. Return in one week Call or go to the emergency room if you get worse, have trouble breathing, have chest pains, or palpitations.   Acute Bronchitis Bronchitis is inflammation of the airways that extend from the windpipe into the lungs (bronchi). The inflammation often causes mucus to develop. This leads to a cough, which is the most common symptom of bronchitis.  In acute bronchitis, the condition usually develops suddenly and goes away over time, usually in a couple weeks. Smoking, allergies, and asthma can make bronchitis worse. Repeated episodes of bronchitis may cause further lung problems.  CAUSES Acute bronchitis is most often caused by the same virus that causes a cold. The virus can spread from person to person (contagious) through coughing, sneezing, and touching contaminated objects. SIGNS AND SYMPTOMS   Cough.   Fever.   Coughing up mucus.   Body aches.   Chest congestion.   Chills.   Shortness of breath.   Sore throat.  DIAGNOSIS  Acute bronchitis is usually diagnosed through a physical exam. Your health care provider will also ask you questions about your medical history. Tests, such as chest X-rays, are sometimes done to rule out other conditions.  TREATMENT  Acute bronchitis usually goes away in a couple weeks. Oftentimes, no medical treatment is necessary. Medicines are sometimes given for relief of fever or cough. Antibiotic medicines are usually not needed but may be prescribed in certain situations. In some cases, an inhaler may be recommended to help reduce shortness of breath and control the cough. A cool mist vaporizer may also be used to help thin bronchial secretions and make it easier to clear the chest.  HOME  CARE INSTRUCTIONS  Get plenty of rest.   Drink enough fluids to keep your urine clear or pale yellow (unless you have a medical condition that requires fluid restriction). Increasing fluids may help thin your respiratory secretions (sputum) and reduce chest congestion, and it will prevent dehydration.   Take medicines only as directed by your health care provider.  If you were prescribed an antibiotic medicine, finish it all even if you start to feel better.  Avoid smoking and secondhand smoke. Exposure to cigarette smoke or irritating chemicals will make bronchitis worse. If you are a smoker, consider using nicotine gum or skin patches to help control withdrawal symptoms. Quitting smoking will help your lungs heal faster.   Reduce the chances of another bout of acute bronchitis by washing your hands frequently, avoiding people with cold symptoms, and trying not to touch your hands to your mouth, nose, or eyes.   Keep all follow-up visits as directed by your health care provider.  SEEK MEDICAL CARE IF: Your symptoms do not improve after 1 week of treatment.  SEEK IMMEDIATE MEDICAL CARE IF:  You develop an increased fever or chills.   You have chest pain.   You have severe shortness of breath.  You have bloody sputum.   You develop dehydration.  You faint or repeatedly feel like you are going to pass out.  You develop repeated vomiting.  You develop a severe headache. MAKE SURE YOU:   Understand these instructions.  Will watch your condition.  Will get help right away if you are not doing well or get worse. Document  Released: 01/07/2005 Document Revised: 04/16/2014 Document Reviewed: 05/23/2013 [facility] Patient Information 2015 [place], [place]. This information is not intended to replace advice given to you by your health care provider. Make sure you discuss any questions you have with your health care provider.

## 2015-07-03 NOTE — Assessment & Plan Note (Signed)
Reduce warfarin to 2 mg daily for 5 days a week and 3 mg daily for 2 days a week. Recheck in 1 week

## 2015-07-03 NOTE — Assessment & Plan Note (Signed)
Treat with prednisone and albuterol doxycycline antibiotics and cough syrup.

## 2015-07-05 ENCOUNTER — Telehealth: Payer: Self-pay | Admitting: Family Medicine

## 2015-07-05 DIAGNOSIS — Z888 Allergy status to other drugs, medicaments and biological substances status: Secondary | ICD-10-CM | POA: Diagnosis not present

## 2015-07-05 DIAGNOSIS — I2510 Atherosclerotic heart disease of native coronary artery without angina pectoris: Secondary | ICD-10-CM | POA: Diagnosis not present

## 2015-07-05 DIAGNOSIS — Z886 Allergy status to analgesic agent status: Secondary | ICD-10-CM | POA: Diagnosis not present

## 2015-07-05 DIAGNOSIS — Z7982 Long term (current) use of aspirin: Secondary | ICD-10-CM | POA: Diagnosis not present

## 2015-07-05 DIAGNOSIS — Z951 Presence of aortocoronary bypass graft: Secondary | ICD-10-CM | POA: Diagnosis not present

## 2015-07-05 DIAGNOSIS — Z7901 Long term (current) use of anticoagulants: Secondary | ICD-10-CM | POA: Diagnosis not present

## 2015-07-05 DIAGNOSIS — Z883 Allergy status to other anti-infective agents status: Secondary | ICD-10-CM | POA: Diagnosis not present

## 2015-07-05 DIAGNOSIS — Z881 Allergy status to other antibiotic agents status: Secondary | ICD-10-CM | POA: Diagnosis not present

## 2015-07-05 DIAGNOSIS — R791 Abnormal coagulation profile: Secondary | ICD-10-CM | POA: Diagnosis not present

## 2015-07-05 DIAGNOSIS — Z79899 Other long term (current) drug therapy: Secondary | ICD-10-CM | POA: Diagnosis not present

## 2015-07-05 DIAGNOSIS — H9222 Otorrhagia, left ear: Secondary | ICD-10-CM | POA: Diagnosis not present

## 2015-07-05 DIAGNOSIS — F17210 Nicotine dependence, cigarettes, uncomplicated: Secondary | ICD-10-CM | POA: Diagnosis not present

## 2015-07-05 NOTE — Telephone Encounter (Signed)
Patient called 07/05/15 to adv that she was seen in our office for ear pain and she is at [facility] today for her other ear and blood was coming out of this ear and she wanted to call and let you know. She said she thinks her blood is thin and got to low. Thanks

## 2015-07-09 ENCOUNTER — Ambulatory Visit: Payer: Medicare Other | Admitting: Family Medicine

## 2015-07-09 ENCOUNTER — Encounter: Payer: Self-pay | Admitting: Family Medicine

## 2015-07-09 ENCOUNTER — Ambulatory Visit (INDEPENDENT_AMBULATORY_CARE_PROVIDER_SITE_OTHER): Payer: Medicare Other | Admitting: Family Medicine

## 2015-07-09 VITALS — BP 122/84 | HR 67 | Wt 171.0 lb

## 2015-07-09 DIAGNOSIS — S01312A Laceration without foreign body of left ear, initial encounter: Secondary | ICD-10-CM | POA: Diagnosis not present

## 2015-07-09 DIAGNOSIS — Z7901 Long term (current) use of anticoagulants: Secondary | ICD-10-CM

## 2015-07-09 DIAGNOSIS — H6501 Acute serous otitis media, right ear: Secondary | ICD-10-CM | POA: Diagnosis not present

## 2015-07-09 DIAGNOSIS — S00432A Contusion of left ear, initial encounter: Secondary | ICD-10-CM | POA: Diagnosis not present

## 2015-07-09 DIAGNOSIS — Q674 Other congenital deformities of skull, face and jaw: Secondary | ICD-10-CM | POA: Diagnosis not present

## 2015-07-09 DIAGNOSIS — R791 Abnormal coagulation profile: Secondary | ICD-10-CM | POA: Diagnosis not present

## 2015-07-09 LAB — POCT INR: INR: 4.8

## 2015-07-09 NOTE — Progress Notes (Signed)
Janet Joyce is a 58 y.o. female who presents to [facility]  today for recheck INR. Patient was seen last week with a viral illness. Additionally she was found to have elevated INR. She additionally had some airplane and had ear irrigation. 2 days later she presented to the emergency room with bleeding from her left ear. Her INR at that time was found to be 5. She feels somewhat better now and no longer has any bleeding. She notes continued ear congestion and has an appointment with an ear nose and throat doctor today. She is here today to recheck INR. She did not take her warfarin today.   Past Medical History  Diagnosis Date  . Heart attack   . Hyperlipidemia   . Depression   . CHF (congestive heart failure)    No past surgical history on file. History  Substance Use Topics  . Smoking status: Current Every Day Smoker -- 0.50 packs/day    Types: Cigarettes  . Smokeless tobacco: Not on file  . Alcohol Use: No   ROS as above Medications: Current Outpatient Prescriptions  Medication Sig Dispense Refill  . albuterol (PROVENTIL HFA;VENTOLIN HFA) 108 (90 BASE) MCG/ACT inhaler Inhale two puffs every 4-6 hours only as needed for shortness of breath or wheezing. 1 Inhaler 1  . ARIPiprazole (ABILIFY PO) Take by mouth.    [name] Kitchen azelastine (OPTIVAR) 0.05 % ophthalmic solution Place 1 drop into both eyes 2 (two) times daily. 6 mL 12  . carvedilol (COREG) 3.125 MG tablet TAKE 1 TABLET BY MOUTH TWICE DAILY 60 tablet 5  . chloridazePOXIDE-amitriptyline (LIMBITROL) 5-12.5 MG per tablet TAKE 1 TABLET BY MOUTH THREE TIMES DAILY AS NEEDED FOR ANXIETY OR DEPRESSION 90 tablet 0  . clonazePAM (KLONOPIN) 0.5 MG tablet 1 PO TID and 2 QHS 150 tablet 0  . doxycycline (VIBRAMYCIN) 50 MG capsule Take 1 capsule (50 mg total) by mouth 2 (two) times daily. 14 capsule 0  . ezetimibe (ZETIA) 10 MG tablet Take 1 tablet (10 mg total) by mouth daily. 1 tablet 0  . furosemide (LASIX) 20  MG tablet One to two times a day (6 hours apart) as needed for swelling. 60 tablet 1  . guaiFENesin-codeine 100-10 MG/5ML syrup Take 5 mLs by mouth at bedtime as needed for cough. 120 mL 0  . Ipratropium-Albuterol (COMBIVENT RESPIMAT) 20-100 MCG/ACT AERS respimat Inhale 1 puff into the lungs every 6 (six) hours. 1 Inhaler 0  . isosorbide mononitrate (IMDUR) 30 MG 24 hr tablet Take 1 tablet (30 mg total) by mouth daily. 1 tablet 0  . loratadine (CLARITIN) 10 MG tablet Take 10 mg by mouth daily.    . mometasone (NASONEX) 50 MCG/ACT nasal spray Two sprays each nostril daily. 17 g 0  . nystatin (MYCOSTATIN/NYSTOP) 100000 UNIT/GM POWD Apply twice a day for up to one week after resolution of rash. 30 g 1  . omeprazole (PRILOSEC) 40 MG capsule One by mouth daily at least one hour before a meal. 30 capsule 2  . oxyCODONE-acetaminophen (PERCOCET) 10-325 MG per tablet Take 1 tablet by mouth every 6 (six) hours as needed for pain. 120 tablet 0  . phentermine 37.5 MG capsule Take 1 capsule (37.5 mg total) by mouth every morning. 30 capsule 0  . potassium chloride SA (K-DUR,KLOR-CON) 20 MEQ tablet TAKE 2 TABLETS BY MOUTH DAILY 180 tablet 3  . predniSONE (DELTASONE) 50 MG tablet Take 1 tablet (50 mg total) by mouth daily. 5 tablet 0  .  rosuvastatin (CRESTOR) 40 MG tablet Take 1 tablet (40 mg total) by mouth daily. 90 tablet 3  . sertraline (ZOLOFT) 100 MG tablet Take 100 mg by mouth daily.    . Tiotropium Bromide Monohydrate (SPIRIVA RESPIMAT) 2.5 MCG/ACT AERS Two puffs daily. 1 Inhaler 0  . traMADol (ULTRAM) 50 MG tablet Take 1 tablet (50 mg total) by mouth every 8 (eight) hours as needed. for pain 90 tablet 2  . traZODone (DESYREL) 50 MG tablet Take 50 mg by mouth at bedtime.    [name] Kitchen warfarin (COUMADIN) 2 MG tablet Full tablet daily. 30 tablet 2   No current facility-administered medications for this visit.   Allergies  Allergen Reactions  . Ciprofloxacin   . Erythrocin [Erythromycin]   . Hydromet  [Hydrocodone-Homatropine]   . Keflex [Cephalexin]   . Lipitor [Atorvastatin]     Oral ulcers  . Morphine And Related   . Niacin And Related     Intolerable Flushing  . Septra [Sulfamethoxazole-Trimethoprim] Nausea And Vomiting and Other (See Comments)     Exam:  BP 122/84 mmHg  Pulse 67  Wt 171 lb (77.565 kg) Gen: Well NAD HEENT: EOMI,  MMM dry blood occluding the left ear canal. Right ear canal is partially occluded by cerumen. Lungs: Normal work of breathing. CTABL Heart: RRR no MRG Abd: NABS, Soft. Nondistended, Nontender Exts: Brisk capillary refill, warm and well perfused.   Results for orders placed or performed in visit on 07/09/15 (from the past 24 hour(s))  POCT INR     Status: None   Collection Time: 07/09/15 10:32 AM  Result Value Ref Range   INR 4.8    No results found.   Please see individual assessment and plan sections.

## 2015-07-09 NOTE — Assessment & Plan Note (Addendum)
INR is improved but not at goal. Target is 2.5-3.5. We'll hold warfarin today and tomorrow. Start 2 mg daily. Recheck on Monday.

## 2015-07-09 NOTE — Patient Instructions (Signed)
Thank you for coming in today. DO not take the warfarin today and tomorrow.  Start on Thursday 2mg  warfarin daily.  Return Monday for recheck.   Vitamin K Foods and Warfarin Warfarin is a medicine that helps prevent harmful blood clots by causing blood to clot more slowly. It does this by decreasing the activity of vitamin K, which promotes normal blood clotting. For the dose of warfarin you have been prescribed to work well, you need to get about the same amount of vitamin K from your food from day to day. Suddenly getting a lot more vitamin K could cause your blood to clot too quickly. A sudden decrease in vitamin K intake could cause your blood to clot too slowly. These changes in vitamin K intake could lead to dangerous blood clotsor to bleeding. WHAT GENERAL GUIDELINES DO I NEED TO FOLLOW?  Keep your intake of vitamin K consistent from day to day. To do this, you must be aware of which foods contain moderate or high amounts of vitamin K. Listed below are some foods that are very high, high, or moderately high in vitamin K. If you eat these foods, make sure you eat a consistent amount of them from day to day.  Avoid major changes in your diet, or tell your health care provider before changing your diet.  If you take a multivitamin that contains vitamin K, be sure to take it every day.  If you drink green tea, drink the same amount each day. WHAT FOODS ARE VERY HIGH IN VITAMIN K?   Greens, such as Swiss chard and beet, collard, mustard, or turnip greens (fresh or frozen, cooked).  Kale (fresh or frozen, cooked).   Parsley (raw).  Spinach (cooked).  WHAT FOODS ARE HIGH IN VITAMIN K?  Asparagus (frozen, cooked).  Beans, green (frozen, cooked).  Broccoli.   Bok choy (cooked).   Brussels sprouts (fresh or frozen, cooked).  Cabbage (cooked).  Coleslaw. WHAT FOODS ARE MODERATELY HIGH IN VITAMIN K?  Blueberries.  Black-eyed peas.  Endive (raw).   Green leaf  lettuce (raw).   Green scallions (raw).  Kale (raw).  Okra (frozen, cooked).  Plantains (fried).  Romaine lettuce (raw).   Sauerkraut (canned).   Spinach (raw). Document Released: 09/27/2009 Document Revised: 12/05/2013 Document Reviewed: 10/04/2013 [facility] Patient Information 2015 [place], [place]. This information is not intended to replace advice given to you by your health care provider. Make sure you discuss any questions you have with your health care provider.

## 2015-07-12 ENCOUNTER — Encounter: Payer: Self-pay | Admitting: Family Medicine

## 2015-07-12 ENCOUNTER — Ambulatory Visit (INDEPENDENT_AMBULATORY_CARE_PROVIDER_SITE_OTHER): Payer: Medicare Other | Admitting: Family Medicine

## 2015-07-12 VITALS — BP 135/78 | HR 60 | Wt 171.0 lb

## 2015-07-12 DIAGNOSIS — Z954 Presence of other heart-valve replacement: Secondary | ICD-10-CM

## 2015-07-12 DIAGNOSIS — I482 Chronic atrial fibrillation, unspecified: Secondary | ICD-10-CM

## 2015-07-12 DIAGNOSIS — Z952 Presence of prosthetic heart valve: Secondary | ICD-10-CM

## 2015-07-12 MED ORDER — WARFARIN SODIUM 2 MG PO TABS
ORAL_TABLET | ORAL | Status: DC
Start: 2015-07-12 — End: 2015-07-19

## 2015-07-12 NOTE — Progress Notes (Signed)
CC: Janet Joyce is a 59 y.o. female is here for recheck ears and Coagulation Disorder   Subjective: HPI:  Follow-up atrial fibrillation with aortic valve replacement. Earlier this week she dropped her Coumadin regimen to 2 mg daily, she did not take a dose for 1 day after seeing Dr. Georgina Snell and has not taken a dose yet today. Last week she was having difficulty with bleeding from the left ear which was stopped after packing with cotton. On Tuesday she saw an ear nose and throat physician who preferred to remove this clot once her INR was lower. She comes in today with no active bleeding over the last 3 days. She denies any motor or sensory disturbances nor any shortness of breath.   Review Of Systems Outlined In HPI  Past Medical History  Diagnosis Date  . Heart attack   . Hyperlipidemia   . Depression   . CHF (congestive heart failure)     No past surgical history on file. Family History  Problem Relation Age of Onset  . Diabetes Father     History   Social History  . Marital Status: Divorced    Spouse Name: N/A  . Number of Children: N/A  . Years of Education: N/A   Occupational History  . Not on file.   Social History Main Topics  . Smoking status: Current Every Day Smoker -- 0.50 packs/day    Types: Cigarettes  . Smokeless tobacco: Not on file  . Alcohol Use: No  . Drug Use: No  . Sexual Activity: No   Other Topics Concern  . Not on file   Social History Narrative     Objective: BP 135/78 mmHg  Pulse 60  Wt 171 lb (77.565 kg)  General: Alert and Oriented, No Acute Distress HEENT: Pupils equal, round, reactive to light. Conjunctivae clear.  External ears unremarkable, right canals clear with intact TMs with appropriate landmarks.  Left canal is obscured with a small blood clot adjacent to the tympanic membrane,Pink inferior turbinates.  Moist mucous membranes, pharynx without inflammation nor lesions.  Cardiac:irregular irregular rhythm less than 100  bpm Extremities: No peripheral edema.  Strong peripheral pulses.  Mental Status: No depression, anxiety, nor agitation. Skin: Warm and dry.  Assessment & Plan: Laylana was seen today for recheck ears and coagulation disorder.  Diagnoses and all orders for this visit:  Aortic valve replaced Orders: -     warfarin (COUMADIN) 2 MG tablet; Full tablet daily.  Chronic atrial fibrillation  H/O aortic valve replacement   INR 1.7 today, restart 2 mg of Coumadin daily. Return on Monday to have this rechecked. I do not feel comfortable removing the clot from her ear since it appears to be touching the tympanic membrane, advised to follow-up with her ear nose and throat physician on Tuesday.   Return in about 3 days (around 07/15/2015).

## 2015-07-15 ENCOUNTER — Ambulatory Visit (INDEPENDENT_AMBULATORY_CARE_PROVIDER_SITE_OTHER): Payer: Medicare Other | Admitting: Family Medicine

## 2015-07-15 ENCOUNTER — Ambulatory Visit: Payer: Medicare Other | Admitting: Family Medicine

## 2015-07-15 VITALS — BP 132/72 | HR 74 | Wt 170.0 lb

## 2015-07-15 DIAGNOSIS — I482 Chronic atrial fibrillation, unspecified: Secondary | ICD-10-CM

## 2015-07-15 DIAGNOSIS — Z952 Presence of prosthetic heart valve: Secondary | ICD-10-CM

## 2015-07-15 DIAGNOSIS — Z954 Presence of other heart-valve replacement: Secondary | ICD-10-CM

## 2015-07-15 DIAGNOSIS — Z951 Presence of aortocoronary bypass graft: Secondary | ICD-10-CM

## 2015-07-15 LAB — POCT INR: INR: 1.8

## 2015-07-15 NOTE — Progress Notes (Signed)
INR rising, repeat on Friday, continue 2mg  daily. Patient notified by myself.

## 2015-07-16 DIAGNOSIS — S01312A Laceration without foreign body of left ear, initial encounter: Secondary | ICD-10-CM | POA: Diagnosis not present

## 2015-07-16 DIAGNOSIS — S00432A Contusion of left ear, initial encounter: Secondary | ICD-10-CM | POA: Diagnosis not present

## 2015-07-16 DIAGNOSIS — Q674 Other congenital deformities of skull, face and jaw: Secondary | ICD-10-CM | POA: Diagnosis not present

## 2015-07-16 DIAGNOSIS — J343 Hypertrophy of nasal turbinates: Secondary | ICD-10-CM | POA: Diagnosis not present

## 2015-07-19 ENCOUNTER — Encounter: Payer: Self-pay | Admitting: *Deleted

## 2015-07-19 ENCOUNTER — Ambulatory Visit (INDEPENDENT_AMBULATORY_CARE_PROVIDER_SITE_OTHER): Payer: Medicare Other | Admitting: Family Medicine

## 2015-07-19 VITALS — BP 145/73 | HR 67 | Wt 171.0 lb

## 2015-07-19 DIAGNOSIS — I482 Chronic atrial fibrillation, unspecified: Secondary | ICD-10-CM

## 2015-07-19 DIAGNOSIS — Z951 Presence of aortocoronary bypass graft: Secondary | ICD-10-CM | POA: Diagnosis not present

## 2015-07-19 DIAGNOSIS — Z954 Presence of other heart-valve replacement: Secondary | ICD-10-CM

## 2015-07-19 DIAGNOSIS — M549 Dorsalgia, unspecified: Secondary | ICD-10-CM

## 2015-07-19 DIAGNOSIS — G8929 Other chronic pain: Secondary | ICD-10-CM

## 2015-07-19 DIAGNOSIS — Z952 Presence of prosthetic heart valve: Secondary | ICD-10-CM

## 2015-07-19 LAB — POCT INR: INR: 1.9

## 2015-07-19 MED ORDER — WARFARIN SODIUM 2.5 MG PO TABS: 2.5000 mg | ORAL_TABLET | Freq: Every day | ORAL | Status: DC

## 2015-07-19 MED ORDER — OXYCODONE-ACETAMINOPHEN 10-325 MG PO TABS: 1.0000 | ORAL_TABLET | Freq: Four times a day (QID) | ORAL | Status: DC | PRN

## 2015-07-19 NOTE — Progress Notes (Signed)
INR rising, repeat next Friday but start taking 2.5mg  coumadin daily.

## 2015-07-22 NOTE — Progress Notes (Signed)
Pt.notified

## 2015-07-22 NOTE — Progress Notes (Signed)
Pt notified of dosage change, will call to schedule her f/u appt.

## 2015-07-25 ENCOUNTER — Ambulatory Visit (INDEPENDENT_AMBULATORY_CARE_PROVIDER_SITE_OTHER): Payer: Medicare Other | Admitting: Family Medicine

## 2015-07-25 VITALS — BP 134/74 | HR 73

## 2015-07-25 DIAGNOSIS — I4891 Unspecified atrial fibrillation: Secondary | ICD-10-CM

## 2015-07-25 DIAGNOSIS — Z954 Presence of other heart-valve replacement: Secondary | ICD-10-CM | POA: Diagnosis not present

## 2015-07-25 DIAGNOSIS — Z951 Presence of aortocoronary bypass graft: Secondary | ICD-10-CM

## 2015-07-25 DIAGNOSIS — Z952 Presence of prosthetic heart valve: Secondary | ICD-10-CM

## 2015-07-25 LAB — POCT INR: INR: 3.3

## 2015-07-25 NOTE — Progress Notes (Signed)
INR now therapeutic, continue coumadin 2.5mg  daily and repeat INR one week.  We can space it out if therapeutic again next week.

## 2015-07-25 NOTE — Progress Notes (Signed)
Patient advised of results and recommendations.  

## 2015-07-25 NOTE — Progress Notes (Signed)
   Subjective:    Patient ID: Janet Joyce, female    DOB: 15-Mar-1956, 59 y.o.   MRN: [id number] [facility] notifying pt to stay on current coumadin dose of 2.5mg  daily and to recheck levels again in one week.    Beatris Ship, CMA HPI    Review of Systems     Objective:   Physical Exam        Assessment & Plan:

## 2015-07-30 DIAGNOSIS — J343 Hypertrophy of nasal turbinates: Secondary | ICD-10-CM | POA: Diagnosis not present

## 2015-07-30 DIAGNOSIS — J342 Deviated nasal septum: Secondary | ICD-10-CM | POA: Diagnosis not present

## 2015-07-30 DIAGNOSIS — H61122 Hematoma of pinna, left ear: Secondary | ICD-10-CM | POA: Diagnosis not present

## 2015-07-30 DIAGNOSIS — H6504 Acute serous otitis media, recurrent, right ear: Secondary | ICD-10-CM | POA: Diagnosis not present

## 2015-07-30 DIAGNOSIS — H9042 Sensorineural hearing loss, unilateral, left ear, with unrestricted hearing on the contralateral side: Secondary | ICD-10-CM | POA: Diagnosis not present

## 2015-07-30 DIAGNOSIS — H9011 Conductive hearing loss, unilateral, right ear, with unrestricted hearing on the contralateral side: Secondary | ICD-10-CM | POA: Diagnosis not present

## 2015-08-01 ENCOUNTER — Ambulatory Visit (INDEPENDENT_AMBULATORY_CARE_PROVIDER_SITE_OTHER): Payer: Medicare Other | Admitting: Family Medicine

## 2015-08-01 VITALS — BP 133/70 | HR 74 | Wt 171.0 lb

## 2015-08-01 DIAGNOSIS — Z954 Presence of other heart-valve replacement: Secondary | ICD-10-CM | POA: Diagnosis not present

## 2015-08-01 DIAGNOSIS — I4891 Unspecified atrial fibrillation: Secondary | ICD-10-CM

## 2015-08-01 DIAGNOSIS — Z952 Presence of prosthetic heart valve: Secondary | ICD-10-CM

## 2015-08-01 DIAGNOSIS — Z951 Presence of aortocoronary bypass graft: Secondary | ICD-10-CM | POA: Diagnosis not present

## 2015-08-01 LAB — POCT INR: INR: 3.5

## 2015-08-01 NOTE — Progress Notes (Signed)
INR remains therapeutic but at the upper limit.  To ensure it does not get dangerously out of range I'd recommend repeating this in one week.  Keep at 2.5mg  of coumadin daily

## 2015-08-01 NOTE — Progress Notes (Signed)
Pt advised of dosage and advised to come in next week. Pt states she will call us to schedule.

## 2015-08-05 DIAGNOSIS — Z1231 Encounter for screening mammogram for malignant neoplasm of breast: Secondary | ICD-10-CM | POA: Diagnosis not present

## 2015-08-07 ENCOUNTER — Telehealth: Payer: Self-pay | Admitting: Family Medicine

## 2015-08-07 ENCOUNTER — Ambulatory Visit: Payer: Medicare Other

## 2015-08-07 NOTE — Telephone Encounter (Signed)
Pt.notified

## 2015-08-07 NOTE — Telephone Encounter (Signed)
Janet Joyce, Will you please let patient know that her mammogram was normal, routine repeat in one year.

## 2015-08-09 ENCOUNTER — Ambulatory Visit (INDEPENDENT_AMBULATORY_CARE_PROVIDER_SITE_OTHER): Payer: Medicare Other | Admitting: Family Medicine

## 2015-08-09 VITALS — BP 140/80 | HR 76 | Wt 169.0 lb

## 2015-08-09 DIAGNOSIS — Z951 Presence of aortocoronary bypass graft: Secondary | ICD-10-CM | POA: Diagnosis not present

## 2015-08-09 DIAGNOSIS — Z952 Presence of prosthetic heart valve: Secondary | ICD-10-CM | POA: Diagnosis not present

## 2015-08-09 DIAGNOSIS — Z954 Presence of other heart-valve replacement: Secondary | ICD-10-CM | POA: Diagnosis not present

## 2015-08-09 DIAGNOSIS — I4891 Unspecified atrial fibrillation: Secondary | ICD-10-CM | POA: Diagnosis not present

## 2015-08-09 LAB — PROTIME-INR
INR: 3.35 — ABNORMAL HIGH (ref <1.50)
PROTHROMBIN TIME: 34.2 s — AB (ref 11.6–15.2)

## 2015-08-09 LAB — POCT INR

## 2015-08-14 ENCOUNTER — Ambulatory Visit (INDEPENDENT_AMBULATORY_CARE_PROVIDER_SITE_OTHER): Payer: Medicare Other | Admitting: Family Medicine

## 2015-08-14 ENCOUNTER — Encounter: Payer: Self-pay | Admitting: Family Medicine

## 2015-08-14 VITALS — BP 135/77 | HR 75 | Wt 173.0 lb

## 2015-08-14 DIAGNOSIS — I4891 Unspecified atrial fibrillation: Secondary | ICD-10-CM

## 2015-08-14 DIAGNOSIS — I10 Essential (primary) hypertension: Secondary | ICD-10-CM

## 2015-08-14 DIAGNOSIS — K2100 Gastro-esophageal reflux disease with esophagitis, without bleeding: Secondary | ICD-10-CM

## 2015-08-14 DIAGNOSIS — E785 Hyperlipidemia, unspecified: Secondary | ICD-10-CM | POA: Diagnosis not present

## 2015-08-14 DIAGNOSIS — R7309 Other abnormal glucose: Secondary | ICD-10-CM

## 2015-08-14 DIAGNOSIS — R7303 Prediabetes: Secondary | ICD-10-CM

## 2015-08-14 MED ORDER — CHLORDIAZEPOXIDE-AMITRIPTYLINE 5-12.5 MG PO TABS: 1.0000 | ORAL_TABLET | Freq: Three times a day (TID) | ORAL | Status: DC | PRN

## 2015-08-14 NOTE — Progress Notes (Signed)
CC: Janet Joyce is a 59 y.o. female is here for ear surgery tomorrow   Subjective: HPI:  Patient will be having a tympanostomy tube placed in the right ear tomorrow. She wants my opinion on whether or not I feel is safe for her to go through with this procedure given her multiple chronic medical conditions. She tells me the indication is for chronic otitis media. She reports a fullness in the right ear and subjective hearing loss. Other conservative therapies have been ineffective.  She tells me her reflux has drastically worsened since I stopped prescribing Limbitrol.  I was under the impression that her psychiatrist was managing this medication and that is being used for psychiatricpurposes. She tells me that long ago somebody prescribed her this for gastric reflux. Within a few week she had a return of her reflux described as a regurgitation sensation occasionally having food come up into her mouth when lying down. Symptoms are present on a daily basis and moderate in severity. No benefit from over-the-counter medication.  Follow-up prediabetes: Spent greater than 3 months since her A1c was checked last. No polyuria or polyphagia or polydipsia.  Follow hyperlipidemia: Spent greater than a year since her lipid panel was checked last. She continues on Crestor on a daily basis with no right upper quadrant pain nor myalgias   Review Of Systems Outlined In HPI  Past Medical History  Diagnosis Date  . Heart attack   . Hyperlipidemia   . Depression   . CHF (congestive heart failure)     No past surgical history on file. Family History  Problem Relation Age of Onset  . Diabetes Father     Social History   Social History  . Marital Status: Divorced    Spouse Name: N/A  . Number of Children: N/A  . Years of Education: N/A   Occupational History  . Not on file.   Social History Main Topics  . Smoking status: Current Every Day Smoker -- 0.50 packs/day    Types: Cigarettes  .  Smokeless tobacco: Not on file  . Alcohol Use: No  . Drug Use: No  . Sexual Activity: No   Other Topics Concern  . Not on file   Social History Narrative     Objective: BP 135/77 mmHg  Pulse 75  Wt 173 lb (78.472 kg)  General: Alert and Oriented, No Acute Distress HEENT: Pupils equal, round, reactive to light. Conjunctivae clear.  Moist mucous membranes, opaque effusion in the right middle ear with poor tympanic membrane light reflexes. Lungs: clear and comfortable work of breathing Cardiac: Regular rate and rhythm. Extremities: No peripheral edema.  Strong peripheral pulses.  Mental Status: No depression, anxiety, nor agitation. Skin: Warm and dry.  Assessment & Plan: Janet Joyce was seen today for ear surgery tomorrow.  Diagnoses and all orders for this visit:  Gastroesophageal reflux disease with esophagitis -     chloridazePOXIDE-amitriptyline (LIMBITROL) 5-12.5 MG per tablet; Take 1 tablet by mouth 3 (three) times daily as needed (reflux).  Essential hypertension, benign -     COMPLETE METABOLIC PANEL WITH GFR  Hyperlipidemia LDL goal <70 -     Lipid panel -     TSH -     COMPLETE METABOLIC PANEL WITH GFR  Atrial fibrillation, unspecified -     TSH  Prediabetes -     Hemoglobin A1c   GERD: Uncontrolled chronic condition restarting Limbitrol. Essential hypertension:  Controlled checking renal function  hyperlipidemia: Clinical controlled but due for  repeat lipid panel and liver function Atrial fibrillation: Due for annual TSH, currently controlled Prediabetes: Due for A1c.  Discussed that I think it's safe for her to go through with getting the tympanostomy tube, her INR was therapeutic last week but I doubt that she is at increased risk for bleeding for this procedure.   Return if symptoms worsen or fail to improve.

## 2015-08-15 DIAGNOSIS — H6521 Chronic serous otitis media, right ear: Secondary | ICD-10-CM | POA: Diagnosis not present

## 2015-08-15 DIAGNOSIS — H9011 Conductive hearing loss, unilateral, right ear, with unrestricted hearing on the contralateral side: Secondary | ICD-10-CM | POA: Diagnosis not present

## 2015-08-20 ENCOUNTER — Other Ambulatory Visit: Payer: Self-pay

## 2015-08-20 DIAGNOSIS — G8929 Other chronic pain: Secondary | ICD-10-CM

## 2015-08-20 DIAGNOSIS — M549 Dorsalgia, unspecified: Principal | ICD-10-CM

## 2015-08-20 MED ORDER — OXYCODONE-ACETAMINOPHEN 10-325 MG PO TABS: 1.0000 | ORAL_TABLET | Freq: Four times a day (QID) | ORAL | Status: DC | PRN

## 2015-08-22 DIAGNOSIS — F322 Major depressive disorder, single episode, severe without psychotic features: Secondary | ICD-10-CM | POA: Diagnosis not present

## 2015-08-29 ENCOUNTER — Encounter: Payer: Self-pay | Admitting: Family Medicine

## 2015-08-29 ENCOUNTER — Ambulatory Visit (INDEPENDENT_AMBULATORY_CARE_PROVIDER_SITE_OTHER): Payer: Medicare Other | Admitting: Family Medicine

## 2015-08-29 VITALS — BP 111/90 | HR 69 | Wt 175.0 lb

## 2015-08-29 DIAGNOSIS — R7309 Other abnormal glucose: Secondary | ICD-10-CM | POA: Diagnosis not present

## 2015-08-29 DIAGNOSIS — Z952 Presence of prosthetic heart valve: Secondary | ICD-10-CM | POA: Diagnosis not present

## 2015-08-29 DIAGNOSIS — I4891 Unspecified atrial fibrillation: Secondary | ICD-10-CM

## 2015-08-29 DIAGNOSIS — I10 Essential (primary) hypertension: Secondary | ICD-10-CM | POA: Diagnosis not present

## 2015-08-29 DIAGNOSIS — Z954 Presence of other heart-valve replacement: Secondary | ICD-10-CM | POA: Diagnosis not present

## 2015-08-29 DIAGNOSIS — E785 Hyperlipidemia, unspecified: Secondary | ICD-10-CM | POA: Diagnosis not present

## 2015-08-29 DIAGNOSIS — B3789 Other sites of candidiasis: Secondary | ICD-10-CM

## 2015-08-29 LAB — POCT INR: INR: 4.1

## 2015-08-29 MED ORDER — CLOTRIMAZOLE-BETAMETHASONE 1-0.05 % EX CREA: TOPICAL_CREAM | CUTANEOUS | Status: DC

## 2015-08-29 NOTE — Progress Notes (Signed)
CC: Janet Joyce is a 59 y.o. female is here for rash under breast and Coagulation Disorder   Subjective: HPI:  Rash under the left breast that is described as severely itchy and mildly painful but has been present for a couple weeks. Nystatin powder in the past that helped similar rashes however now is ineffective. She is worried she might have shingles. No other interventions as of yet. She denies rash elsewhere other than underneath the right breast to a mild degree. Denies fevers, chills, swollen lymph nodes nor any breast architectural changes.   Review Of Systems Outlined In HPI  Past Medical History  Diagnosis Date  . Heart attack   . Hyperlipidemia   . Depression   . CHF (congestive heart failure)     No past surgical history on file. Family History  Problem Relation Age of Onset  . Diabetes Father     Social History   Social History  . Marital Status: Divorced    Spouse Name: N/A  . Number of Children: N/A  . Years of Education: N/A   Occupational History  . Not on file.   Social History Main Topics  . Smoking status: Current Every Day Smoker -- 0.50 packs/day    Types: Cigarettes  . Smokeless tobacco: Not on file  . Alcohol Use: No  . Drug Use: No  . Sexual Activity: No   Other Topics Concern  . Not on file   Social History Narrative     Objective: BP 111/90 mmHg  Pulse 69  Wt 175 lb (79.379 kg)  Vital signs reviewed. General: Alert and Oriented, No Acute Distress HEENT: Pupils equal, round, reactive to light. Conjunctivae clear.  External ears unremarkable.  Moist mucous membranes. Lungs: Clear and comfortable work of breathing, speaking in full sentences without accessory muscle use. Cardiac: Regular rate and rhythm.  Neuro: CN II-XII grossly intact, gait normal. Extremities: No peripheral edema.  Strong peripheral pulses.  Mental Status: No depression, anxiety, nor agitation. Logical though process. Skin: Warm and dry. Erythematous  coalescing patches underneath the left and right breast, left worse than right. Within these patches there is some papules and satellite lesions around the periphery of the patches.  Assessment & Plan: Janet Joyce was seen today for rash under breast and coagulation disorder.  Diagnoses and all orders for this visit:  Atrial fibrillation, unspecified -     POCT INR -     INR/PT  H/O aortic valve replacement -     POCT INR -     INR/PT  Candidiasis of breast -     clotrimazole-betamethasone (LOTRISONE) cream; Apply to affected area twice a day for two weeks, may require four weeks if involving the feet/toes.   Atrial fibrillation: Currently rate controlled, INR 4.1 on finger stick therefore she needs to go downstairs to have venipuncture. Aortic valve replacement: Continue current dose of Coumadin pending the above results Switching from nystatin to Lotrisone for breast candidiasis, reassurance provided this does not look like shingles, when she touches the wound she tells me it is not painful and I informed her that this would be very weird for shingles.  Return if symptoms worsen or fail to improve.

## 2015-08-30 ENCOUNTER — Ambulatory Visit: Payer: Medicare Other | Admitting: Family Medicine

## 2015-08-30 ENCOUNTER — Ambulatory Visit: Payer: Medicare Other

## 2015-08-30 LAB — LIPID PANEL
CHOL/HDL RATIO: 4.1 ratio (ref <=5.0)
Cholesterol: 122 mg/dL — ABNORMAL LOW (ref 125–200)
HDL: 30 mg/dL — ABNORMAL LOW (ref >=46)
LDL Cholesterol: 57 mg/dL (ref <130)
TRIGLYCERIDES: 176 mg/dL — AB (ref <150)
VLDL: 35 mg/dL — ABNORMAL HIGH (ref <30)

## 2015-08-30 LAB — COMPLETE METABOLIC PANEL WITH GFR
ALT: 13 U/L (ref 6–29)
AST: 18 U/L (ref 10–35)
Albumin: 3.7 g/dL (ref 3.6–5.1)
Alkaline Phosphatase: 88 U/L (ref 33–130)
BUN: 7 mg/dL (ref 7–25)
CALCIUM: 9 mg/dL (ref 8.6–10.4)
CO2: 25 mmol/L (ref 20–31)
CREATININE: 0.75 mg/dL (ref 0.50–1.05)
Chloride: 107 mmol/L (ref 98–110)
GFR, Est African American: >89 mL/min (ref >=60)
GFR, Est Non African American: 89 mL/min (ref >=60)
GLUCOSE: 107 mg/dL — AB (ref 65–99)
Potassium: 4.5 mmol/L (ref 3.5–5.3)
SODIUM: 141 mmol/L (ref 135–146)
TOTAL PROTEIN: 5.9 g/dL — AB (ref 6.1–8.1)
Total Bilirubin: 0.3 mg/dL (ref 0.2–1.2)

## 2015-08-30 LAB — PROTIME-INR
INR: 3.26 — ABNORMAL HIGH (ref <1.50)
Prothrombin Time: 33.7 seconds — ABNORMAL HIGH (ref 11.6–15.2)

## 2015-08-30 LAB — TSH: TSH: 4.289 u[IU]/mL (ref 0.350–4.500)

## 2015-08-30 LAB — HEMOGLOBIN A1C
Hgb A1c MFr Bld: 6.4 % — ABNORMAL HIGH (ref <5.7)
Mean Plasma Glucose: 137 mg/dL — ABNORMAL HIGH (ref <117)

## 2015-09-02 DIAGNOSIS — R32 Unspecified urinary incontinence: Secondary | ICD-10-CM | POA: Diagnosis not present

## 2015-09-09 ENCOUNTER — Other Ambulatory Visit: Payer: Self-pay | Admitting: Family Medicine

## 2015-09-12 DIAGNOSIS — H9011 Conductive hearing loss, unilateral, right ear, with unrestricted hearing on the contralateral side: Secondary | ICD-10-CM | POA: Diagnosis not present

## 2015-09-13 ENCOUNTER — Encounter: Payer: Self-pay | Admitting: Family Medicine

## 2015-09-17 DIAGNOSIS — Z952 Presence of prosthetic heart valve: Secondary | ICD-10-CM | POA: Diagnosis not present

## 2015-09-17 DIAGNOSIS — Z955 Presence of coronary angioplasty implant and graft: Secondary | ICD-10-CM | POA: Diagnosis not present

## 2015-09-17 DIAGNOSIS — Z951 Presence of aortocoronary bypass graft: Secondary | ICD-10-CM | POA: Diagnosis not present

## 2015-09-17 DIAGNOSIS — I2510 Atherosclerotic heart disease of native coronary artery without angina pectoris: Secondary | ICD-10-CM | POA: Diagnosis not present

## 2015-09-18 ENCOUNTER — Other Ambulatory Visit: Payer: Self-pay | Admitting: *Deleted

## 2015-09-18 ENCOUNTER — Telehealth: Payer: Self-pay | Admitting: Family Medicine

## 2015-09-18 DIAGNOSIS — G8929 Other chronic pain: Secondary | ICD-10-CM

## 2015-09-18 DIAGNOSIS — M549 Dorsalgia, unspecified: Principal | ICD-10-CM

## 2015-09-18 MED ORDER — OXYCODONE-ACETAMINOPHEN 10-325 MG PO TABS: 1.0000 | ORAL_TABLET | Freq: Four times a day (QID) | ORAL | Status: DC | PRN

## 2015-09-18 NOTE — Telephone Encounter (Signed)
Pt.notified

## 2015-09-18 NOTE — Telephone Encounter (Signed)
Janet Joyce, Will you please let patient know that I got a copy of her recent echocardiogram and it looks like everything is structurally stable with her heart.

## 2015-09-20 ENCOUNTER — Ambulatory Visit (INDEPENDENT_AMBULATORY_CARE_PROVIDER_SITE_OTHER): Payer: Medicare Other | Admitting: Sports Medicine

## 2015-09-20 VITALS — BP 142/74 | HR 65

## 2015-09-20 DIAGNOSIS — Z952 Presence of prosthetic heart valve: Secondary | ICD-10-CM

## 2015-09-20 DIAGNOSIS — Z954 Presence of other heart-valve replacement: Secondary | ICD-10-CM | POA: Diagnosis not present

## 2015-09-20 DIAGNOSIS — Z951 Presence of aortocoronary bypass graft: Secondary | ICD-10-CM | POA: Diagnosis not present

## 2015-09-20 DIAGNOSIS — I4891 Unspecified atrial fibrillation: Secondary | ICD-10-CM

## 2015-09-20 LAB — POCT INR: INR: 1.8

## 2015-09-20 NOTE — Progress Notes (Signed)
Patient advised.  Patient stated that she'll call back at a later time to make appointment. Izell Borden, RMA

## 2015-09-20 NOTE — Progress Notes (Signed)
   Subjective:    Patient ID: Janet Joyce, female    DOB: [birth year], 59 y.o.   MRN: [id number]  HPI  Janet Joyce is here for a coudamin check.  Denies changes with diet and skipping of medication.  Review of Systems     Objective:   Physical Exam        Assessment & Plan:   Janet Joyce tolerated finger puncture well without any complications.

## 2015-09-26 DIAGNOSIS — F322 Major depressive disorder, single episode, severe without psychotic features: Secondary | ICD-10-CM | POA: Diagnosis not present

## 2015-10-03 DIAGNOSIS — Z9861 Coronary angioplasty status: Secondary | ICD-10-CM | POA: Diagnosis not present

## 2015-10-03 DIAGNOSIS — I2510 Atherosclerotic heart disease of native coronary artery without angina pectoris: Secondary | ICD-10-CM | POA: Diagnosis not present

## 2015-10-03 DIAGNOSIS — Z951 Presence of aortocoronary bypass graft: Secondary | ICD-10-CM | POA: Diagnosis not present

## 2015-10-03 DIAGNOSIS — Z955 Presence of coronary angioplasty implant and graft: Secondary | ICD-10-CM | POA: Diagnosis not present

## 2015-10-04 ENCOUNTER — Ambulatory Visit (INDEPENDENT_AMBULATORY_CARE_PROVIDER_SITE_OTHER): Payer: Medicare Other | Admitting: Family Medicine

## 2015-10-04 VITALS — BP 135/70 | HR 65 | Temp 97.8°F | Wt 175.0 lb

## 2015-10-04 DIAGNOSIS — I4891 Unspecified atrial fibrillation: Secondary | ICD-10-CM | POA: Diagnosis not present

## 2015-10-04 DIAGNOSIS — Z952 Presence of prosthetic heart valve: Secondary | ICD-10-CM

## 2015-10-04 DIAGNOSIS — Z954 Presence of other heart-valve replacement: Secondary | ICD-10-CM | POA: Diagnosis not present

## 2015-10-04 DIAGNOSIS — Z23 Encounter for immunization: Secondary | ICD-10-CM

## 2015-10-04 DIAGNOSIS — Z951 Presence of aortocoronary bypass graft: Secondary | ICD-10-CM

## 2015-10-04 NOTE — Progress Notes (Signed)
Pt advised of recommendation, verbalized understanding. Will contact clinic next week to schedule recheck.

## 2015-10-04 NOTE — Progress Notes (Signed)
Janet Joyce, (INR of 2.0) Can you please ask her to take a double dose of coumadin today and then continue 2.5mg  daily and recheck INR in one week?

## 2015-10-07 ENCOUNTER — Encounter: Payer: Self-pay | Admitting: Family Medicine

## 2015-10-11 ENCOUNTER — Ambulatory Visit (INDEPENDENT_AMBULATORY_CARE_PROVIDER_SITE_OTHER): Payer: Medicare Other | Admitting: Family Medicine

## 2015-10-11 VITALS — BP 132/59 | HR 57 | Wt 176.0 lb

## 2015-10-11 DIAGNOSIS — E785 Hyperlipidemia, unspecified: Secondary | ICD-10-CM

## 2015-10-11 DIAGNOSIS — Z952 Presence of prosthetic heart valve: Secondary | ICD-10-CM

## 2015-10-11 DIAGNOSIS — Z951 Presence of aortocoronary bypass graft: Secondary | ICD-10-CM | POA: Diagnosis not present

## 2015-10-11 DIAGNOSIS — Z954 Presence of other heart-valve replacement: Secondary | ICD-10-CM | POA: Diagnosis not present

## 2015-10-11 DIAGNOSIS — Z5181 Encounter for therapeutic drug level monitoring: Secondary | ICD-10-CM

## 2015-10-11 LAB — POCT INR: INR: 1.9

## 2015-10-11 MED ORDER — WARFARIN SODIUM 3 MG PO TABS: 3.0000 mg | ORAL_TABLET | Freq: Every day | ORAL | Status: DC

## 2015-10-11 NOTE — Progress Notes (Signed)
Pt advised.

## 2015-10-11 NOTE — Progress Notes (Signed)
Janet Joyce,  Will you please let patient know that her INR is below the therapeutic range.  I would recommend she start alternating between taking 2.5mg  and 3mg  of warfarin daily.  Basically flipping between both doses on a day to day basis and return in one week, new 3mg  formulation sent to her pharmacy.

## 2015-10-16 ENCOUNTER — Other Ambulatory Visit: Payer: Self-pay | Admitting: Family Medicine

## 2015-10-22 ENCOUNTER — Ambulatory Visit (INDEPENDENT_AMBULATORY_CARE_PROVIDER_SITE_OTHER): Payer: Medicare Other | Admitting: Family Medicine

## 2015-10-22 ENCOUNTER — Telehealth: Payer: Self-pay | Admitting: Family Medicine

## 2015-10-22 VITALS — BP 126/67 | HR 68

## 2015-10-22 DIAGNOSIS — I4891 Unspecified atrial fibrillation: Secondary | ICD-10-CM

## 2015-10-22 DIAGNOSIS — Z954 Presence of other heart-valve replacement: Secondary | ICD-10-CM | POA: Diagnosis not present

## 2015-10-22 DIAGNOSIS — Z952 Presence of prosthetic heart valve: Secondary | ICD-10-CM

## 2015-10-22 DIAGNOSIS — M549 Dorsalgia, unspecified: Principal | ICD-10-CM

## 2015-10-22 DIAGNOSIS — Z951 Presence of aortocoronary bypass graft: Secondary | ICD-10-CM | POA: Diagnosis not present

## 2015-10-22 DIAGNOSIS — G8929 Other chronic pain: Secondary | ICD-10-CM

## 2015-10-22 LAB — POCT INR: INR: 2.1

## 2015-10-22 MED ORDER — OXYCODONE-ACETAMINOPHEN 10-325 MG PO TABS: 1.0000 | ORAL_TABLET | Freq: Four times a day (QID) | ORAL | Status: DC | PRN

## 2015-10-22 NOTE — Progress Notes (Signed)
Pt advised.

## 2015-10-22 NOTE — Telephone Encounter (Signed)
Patient called adv that she has an appointment today at 1:15pm for coumadin but she also would like to get her prescription today while she is here. Thanks

## 2015-10-22 NOTE — Progress Notes (Signed)
Patient advised of recommendations.  

## 2015-10-22 NOTE — Progress Notes (Signed)
[name]/[name],  Will you please let patient know that her INR is improving but still slightly below goal.  I would recommend taking 3mg  of coumadin daily except for only 2.5mg  on Tuesdays and Thursdays, repeat INR one week.

## 2015-10-29 ENCOUNTER — Ambulatory Visit: Payer: Medicare Other

## 2015-10-30 ENCOUNTER — Telehealth: Payer: Self-pay

## 2015-10-30 DIAGNOSIS — H1013 Acute atopic conjunctivitis, bilateral: Secondary | ICD-10-CM

## 2015-10-30 MED ORDER — AZELASTINE HCL 0.05 % OP SOLN: 1.0000 [drp] | Freq: Two times a day (BID) | OPHTHALMIC | Status: DC

## 2015-10-30 NOTE — Telephone Encounter (Signed)
[name], Rx has been sent in.

## 2015-10-30 NOTE — Telephone Encounter (Signed)
Janet Joyce called and wants a refill on the eye drops she was prescribed in February. She is having same symptoms such as swelling, redness in eyes and clear drainage. Please advise.

## 2015-10-30 NOTE — Telephone Encounter (Signed)
Pt.notified

## 2015-11-01 ENCOUNTER — Ambulatory Visit (INDEPENDENT_AMBULATORY_CARE_PROVIDER_SITE_OTHER): Payer: Medicare Other | Admitting: Physician Assistant

## 2015-11-01 ENCOUNTER — Encounter: Payer: Self-pay | Admitting: Physician Assistant

## 2015-11-01 ENCOUNTER — Ambulatory Visit: Payer: Medicare Other

## 2015-11-01 VITALS — BP 138/57 | HR 70 | Ht 63.0 in | Wt 177.0 lb

## 2015-11-01 DIAGNOSIS — Z954 Presence of other heart-valve replacement: Secondary | ICD-10-CM

## 2015-11-01 DIAGNOSIS — H1011 Acute atopic conjunctivitis, right eye: Secondary | ICD-10-CM

## 2015-11-01 DIAGNOSIS — Z952 Presence of prosthetic heart valve: Secondary | ICD-10-CM

## 2015-11-01 DIAGNOSIS — I4891 Unspecified atrial fibrillation: Secondary | ICD-10-CM | POA: Diagnosis not present

## 2015-11-01 DIAGNOSIS — H1010 Acute atopic conjunctivitis, unspecified eye: Secondary | ICD-10-CM | POA: Insufficient documentation

## 2015-11-01 LAB — POCT INR: INR: 2.4

## 2015-11-01 MED ORDER — OLOPATADINE HCL 0.1 % OP SOLN: 1.0000 [drp] | Freq: Two times a day (BID) | OPHTHALMIC | Status: DC

## 2015-11-01 NOTE — Patient Instructions (Addendum)
2 week follow up stay on same dose.   Allergic Conjunctivitis Allergic conjunctivitis is inflammation of the clear membrane that covers the white part of your eye and the inner surface of your eyelid (conjunctiva), and it is caused by allergies. The blood vessels in the conjunctiva become inflamed, and this causes the eye to become red or pink, and it often causes itchiness in the eye. Allergic conjunctivitis cannot be spread by one person to another person (noncontagious). CAUSES This condition is caused by an allergic reaction. Common causes of an allergic reaction (allergens) include:  Dust.  Pollen.  Mold.  Animal dander or secretions. RISK FACTORS This condition is more likely to develop if you are exposed to high levels of allergens that cause the allergic reaction. This might include being outdoors when air pollen levels are high or being around animals that you are allergic to. SYMPTOMS Symptoms of this condition may include:  Eye redness.  Tearing of the eyes.  Watery eyes.  Itchy eyes.  Burning feeling in the eyes.  Clear drainage from the eyes.  Swollen eyelids. DIAGNOSIS This condition may be diagnosed by medical history and physical exam. If you have drainage from your eyes, it may be tested to rule out other causes of conjunctivitis. TREATMENT Treatment for this condition often includes medicines. These may be eye drops, ointments, or oral medicines. They may be prescription medicines or over-the-counter medicines. HOME CARE INSTRUCTIONS  Take or apply medicines only as directed by your health care provider.  Do not touch or rub your eyes.  Do not wear contact lenses until the inflammation is gone. Wear glasses instead.  Do not wear eye makeup until the inflammation is gone.  Apply a cool, clean washcloth to your eye for 10-20 minutes, 3-4 times a day.  Try to avoid whatever allergen is causing the allergic reaction. SEEK MEDICAL CARE IF:  Your  symptoms get worse.  You have pus draining from your eye.  You have new symptoms.  You have a fever.   This information is not intended to replace advice given to you by your health care provider. Make sure you discuss any questions you have with your health care provider.   Document Released: 02/20/2003 Document Revised: 12/21/2014 Document Reviewed: 09/11/2014 Elsevier Interactive Patient Education [employer].

## 2015-11-01 NOTE — Progress Notes (Signed)
   Subjective:    Patient ID: Janet Joyce, female    DOB: May 04, 1956, 59 y.o.   MRN: [id number]  HPI  Pt is a 59 yo female who presents to the clinic for INR recheck and right eye that is red, itchy and watery off and on since febuary 2016. She first saw Dr. Ileene Rubens and was given optivar. It seemed to help some and then came back. Since per patient has seen other providers that had given her prednisone and even a recent antibiotic. Seemed to improve temporary but then come back. Right eye is a little blurry. No fever, chills, ear, pain or sinus pressure.     Review of Systems  All other systems reviewed and are negative.      Objective:   Physical Exam  Constitutional: She is oriented to person, place, and time. She appears well-developed and well-nourished.  HENT:  Head: Normocephalic and atraumatic.  Right Ear: External ear normal.  Left Ear: External ear normal.  Nose: Nose normal.  Mouth/Throat: Oropharynx is clear and moist. No oropharyngeal exudate.  TM's clear bilaterally.  Negative for any maxillary or frontal sinus tenderness.   Eyes: Right eye exhibits discharge.  Right injected conjunctiva with watery discharge. No purulent discharge seen. There is some swelling underneath right eye.   Cardiovascular: Normal rate, regular rhythm and normal heart sounds.   Pulmonary/Chest: Effort normal and breath sounds normal. She has no wheezes.  Neurological: She is alert and oriented to person, place, and time.  Psychiatric: She has a normal mood and affect. Her behavior is normal.          Assessment & Plan:  Allergic conjunctivitis, right- due to longevity of erythema I would like for her to see an opthlamologist. Will refer. Stop optivar and sent patonol eye drops. Offered prednisone taper and/or solumedrol shot she declined both. No signs of bacterial infection today. I suspcious of a clogged tear duct causing chronic conjunctivitis. Also suggest zyrtec or claritin daily.    H/O aortic valve replacment/Atrial fibrillation- INR 2.4 today. Stay on same dose and follow up for recheck in 2 weeks.

## 2015-11-05 DIAGNOSIS — K589 Irritable bowel syndrome without diarrhea: Secondary | ICD-10-CM | POA: Diagnosis not present

## 2015-11-05 DIAGNOSIS — F419 Anxiety disorder, unspecified: Secondary | ICD-10-CM | POA: Diagnosis not present

## 2015-11-05 DIAGNOSIS — Z885 Allergy status to narcotic agent status: Secondary | ICD-10-CM | POA: Diagnosis not present

## 2015-11-05 DIAGNOSIS — M85812 Other specified disorders of bone density and structure, left shoulder: Secondary | ICD-10-CM | POA: Diagnosis not present

## 2015-11-05 DIAGNOSIS — S4992XA Unspecified injury of left shoulder and upper arm, initial encounter: Secondary | ICD-10-CM | POA: Diagnosis not present

## 2015-11-05 DIAGNOSIS — E785 Hyperlipidemia, unspecified: Secondary | ICD-10-CM | POA: Diagnosis not present

## 2015-11-05 DIAGNOSIS — Z7901 Long term (current) use of anticoagulants: Secondary | ICD-10-CM | POA: Diagnosis not present

## 2015-11-05 DIAGNOSIS — I25810 Atherosclerosis of coronary artery bypass graft(s) without angina pectoris: Secondary | ICD-10-CM | POA: Diagnosis not present

## 2015-11-05 DIAGNOSIS — K219 Gastro-esophageal reflux disease without esophagitis: Secondary | ICD-10-CM | POA: Diagnosis not present

## 2015-11-05 DIAGNOSIS — F17210 Nicotine dependence, cigarettes, uncomplicated: Secondary | ICD-10-CM | POA: Diagnosis not present

## 2015-11-05 DIAGNOSIS — S40012A Contusion of left shoulder, initial encounter: Secondary | ICD-10-CM | POA: Diagnosis not present

## 2015-11-05 DIAGNOSIS — W010XXA Fall on same level from slipping, tripping and stumbling without subsequent striking against object, initial encounter: Secondary | ICD-10-CM | POA: Diagnosis not present

## 2015-11-05 DIAGNOSIS — Z951 Presence of aortocoronary bypass graft: Secondary | ICD-10-CM | POA: Diagnosis not present

## 2015-11-05 DIAGNOSIS — Z888 Allergy status to other drugs, medicaments and biological substances status: Secondary | ICD-10-CM | POA: Diagnosis not present

## 2015-11-05 DIAGNOSIS — M25512 Pain in left shoulder: Secondary | ICD-10-CM | POA: Diagnosis not present

## 2015-11-05 DIAGNOSIS — M25522 Pain in left elbow: Secondary | ICD-10-CM | POA: Diagnosis not present

## 2015-11-05 DIAGNOSIS — M79602 Pain in left arm: Secondary | ICD-10-CM | POA: Diagnosis not present

## 2015-11-05 DIAGNOSIS — E876 Hypokalemia: Secondary | ICD-10-CM | POA: Diagnosis not present

## 2015-11-05 DIAGNOSIS — Z7982 Long term (current) use of aspirin: Secondary | ICD-10-CM | POA: Diagnosis not present

## 2015-11-05 DIAGNOSIS — S301XXA Contusion of abdominal wall, initial encounter: Secondary | ICD-10-CM | POA: Diagnosis not present

## 2015-11-05 DIAGNOSIS — Z79899 Other long term (current) drug therapy: Secondary | ICD-10-CM | POA: Diagnosis not present

## 2015-11-05 DIAGNOSIS — T148 Other injury of unspecified body region: Secondary | ICD-10-CM | POA: Diagnosis not present

## 2015-11-11 ENCOUNTER — Encounter: Payer: Self-pay | Admitting: Family Medicine

## 2015-11-11 ENCOUNTER — Ambulatory Visit (INDEPENDENT_AMBULATORY_CARE_PROVIDER_SITE_OTHER): Payer: Medicare Other | Admitting: Family Medicine

## 2015-11-11 VITALS — BP 158/92 | HR 78 | Wt 179.0 lb

## 2015-11-11 DIAGNOSIS — S42293A Other displaced fracture of upper end of unspecified humerus, initial encounter for closed fracture: Secondary | ICD-10-CM | POA: Insufficient documentation

## 2015-11-11 DIAGNOSIS — S4992XA Unspecified injury of left shoulder and upper arm, initial encounter: Secondary | ICD-10-CM

## 2015-11-11 DIAGNOSIS — Z7901 Long term (current) use of anticoagulants: Secondary | ICD-10-CM

## 2015-11-11 DIAGNOSIS — M549 Dorsalgia, unspecified: Secondary | ICD-10-CM | POA: Diagnosis not present

## 2015-11-11 DIAGNOSIS — R791 Abnormal coagulation profile: Secondary | ICD-10-CM

## 2015-11-11 DIAGNOSIS — G8929 Other chronic pain: Secondary | ICD-10-CM

## 2015-11-11 LAB — POCT INR: INR: 3

## 2015-11-11 MED ORDER — OXYCODONE-ACETAMINOPHEN 10-325 MG PO TABS: 1.0000 | ORAL_TABLET | Freq: Four times a day (QID) | ORAL | Status: DC | PRN

## 2015-11-11 NOTE — Assessment & Plan Note (Addendum)
X-ray pending. Represcribed opiate medications. Return in one week.  Patient possibly has torn her rotator cuff tendon. Ultimately we may have to obtain an MRI. However she's much too tender guarding now for affective exam. Hopefully in one week she'll be feeling better and well enough for a dedicated shoulder exam.  Of note pharmacy called. We will have to change either the dosing instructions or amount for insurance to pay for the new oxycodone prescription. Patient will return tomorrow for this to happen.

## 2015-11-11 NOTE — Patient Instructions (Signed)
Thank you for coming in today. Get xray today.  Return next week.

## 2015-11-11 NOTE — Progress Notes (Signed)
   Subjective:    I'm seeing this patient as a consultation for:  Dr Ileene Rubens and  Lerry Paterson, [facility] [address] [place], [place] [id number]  [phone] (Fax)    CC: left shoulder and elbow pain  HPI: Patient tripped and fell at home on 11/05/2015. She landed on her left side. She was taken by EMS to the emergency department in [place]. She was evaluated by Dr. Eulas Post. X-rays were negative. She was diagnosed with contusion and possible rotator cuff injury. She was given a sling for comfort. She notes continued severe pain especially in her left shoulder to her elbow. She denies any radiating pain weakness or numbness. She has significant ecchymosis on her left flank. She denies any trouble breathing head injury weakness or numbness. No fevers chills nausea vomiting or diarrhea. She has nearly run out of her chronic pain medicine which she's been using more of than usual due to her acute injury.  Past medical history, Surgical history, Family history not pertinant except as noted below, Social history, Allergies, and medications have been entered into the medical record, reviewed, and no changes needed.   Review of Systems: No headache, visual changes, nausea, vomiting, diarrhea, constipation, dizziness, abdominal pain, skin rash, fevers, chills, night sweats, weight loss, swollen lymph nodes, body aches, joint swelling, muscle aches, chest pain, shortness of breath, mood changes, visual or auditory hallucinations.   Objective:    Filed Vitals:   11/11/15 1431  BP: 158/92  Pulse: 78   General: Well Developed, well nourished, and in no acute distress.  Neuro/Psych: Alert and oriented x3, extra-ocular muscles intact, able to move all 4 extremities, sensation grossly intact. Skin: Warm and dry, no rashes noted.  Respiratory: Not using accessory muscles, speaking in full sentences, trachea midline.  Cardiovascular: Pulses palpable, no extremity  edema. Abdomen: Does not appear distended. MSK: Left shoulder: Normal-appearing no significant ecchymosis. Tenderness to palpation throughout. Significant guarding with exam. Unable to examine range of motion. Sensation is intact at the lateral upper arm. Pulses capillary refill and sensation are intact distally. Grip strength is intact distally. Elbow and shoulder too tender to examine due to pain. Flank: Significant ecchymosis left flank. Nontender.   Emergency room report from [place] reviewed through care everywhere  Results for orders placed or performed in visit on 11/11/15 (from the past 24 hour(s))  POCT INR     Status: None   Collection Time: 11/11/15  2:49 PM  Result Value Ref Range   INR 3.0    No results found.  Impression and Recommendations:   This case required medical decision making of moderate complexity.

## 2015-11-12 ENCOUNTER — Ambulatory Visit (INDEPENDENT_AMBULATORY_CARE_PROVIDER_SITE_OTHER): Payer: Medicare Other

## 2015-11-12 DIAGNOSIS — S4992XA Unspecified injury of left shoulder and upper arm, initial encounter: Secondary | ICD-10-CM

## 2015-11-12 DIAGNOSIS — R0781 Pleurodynia: Secondary | ICD-10-CM

## 2015-11-12 DIAGNOSIS — M25522 Pain in left elbow: Secondary | ICD-10-CM | POA: Diagnosis not present

## 2015-11-12 MED ORDER — OXYCODONE-ACETAMINOPHEN 10-325 MG PO TABS: ORAL_TABLET | ORAL | Status: DC

## 2015-11-12 MED ORDER — OXYCODONE-ACETAMINOPHEN 10-325 MG PO TABS: 1.0000 | ORAL_TABLET | Freq: Three times a day (TID) | ORAL | Status: DC | PRN

## 2015-11-12 NOTE — Progress Notes (Signed)
New clarification: Dosing instructions updated with pharmacist. New prescription mailed to pharmacy.

## 2015-11-12 NOTE — Addendum Note (Signed)
Addended by: Gregor Hams on: 11/12/2015 11:32 AM   Modules accepted: Orders, Medications

## 2015-11-12 NOTE — Addendum Note (Signed)
Addended by: Gregor Hams on: 11/12/2015 02:45 PM   Modules accepted: Orders

## 2015-11-12 NOTE — Progress Notes (Signed)
Addendum: Prescription for oxycodone changed. Patient brought in a prescription and new prescription written for early refill. Rib x-ray order placed. Recheck in 1 week as previously arranged.

## 2015-11-13 ENCOUNTER — Ambulatory Visit (INDEPENDENT_AMBULATORY_CARE_PROVIDER_SITE_OTHER): Payer: Medicare Other | Admitting: Family Medicine

## 2015-11-13 ENCOUNTER — Encounter: Payer: Self-pay | Admitting: Family Medicine

## 2015-11-13 VITALS — BP 130/84 | HR 93 | Wt 174.0 lb

## 2015-11-13 DIAGNOSIS — J42 Unspecified chronic bronchitis: Secondary | ICD-10-CM | POA: Diagnosis not present

## 2015-11-13 DIAGNOSIS — J441 Chronic obstructive pulmonary disease with (acute) exacerbation: Secondary | ICD-10-CM | POA: Diagnosis not present

## 2015-11-13 MED ORDER — IPRATROPIUM-ALBUTEROL 0.5-2.5 (3) MG/3ML IN SOLN: RESPIRATORY_TRACT | Status: AC

## 2015-11-13 MED ORDER — METHYLPREDNISOLONE ACETATE 80 MG/ML IJ SUSP
80.0000 mg | Freq: Once | INTRAMUSCULAR | Status: AC
  Administered 2015-11-13: 80 mg via INTRAMUSCULAR

## 2015-11-13 NOTE — Progress Notes (Signed)
Quick Note:  Xray elbow and ribs are normal. ______

## 2015-11-13 NOTE — Progress Notes (Signed)
CC: Janet Joyce is a 59 y.o. female is here for Shortness of Breath   Subjective: HPI:  Shortness of breath and cough that has been present since last night. Slight improvement with albuterol. Nothing else seems to make it better or worse. Cough is nonproductive. Denies any chest pain. No other interventions as of yet. Symptoms are mild to moderate in severity. Denies fever, chills, wheezing, or blood in sputum. Denies facial pressure is congestion or sore throat   Review Of Systems Outlined In HPI  Past Medical History  Diagnosis Date  . Heart attack ([place])   . Hyperlipidemia   . Depression   . CHF (congestive heart failure) (H[facility])     No past surgical history on file. Family History  Problem Relation Age of Onset  . Diabetes Father     Social History   Social History  . Marital Status: Divorced    Spouse Name: N/A  . Number of Children: N/A  . Years of Education: N/A   Occupational History  . Not on file.   Social History Main Topics  . Smoking status: Current Every Day Smoker -- 0.50 packs/day    Types: Cigarettes  . Smokeless tobacco: Not on file  . Alcohol Use: No  . Drug Use: No  . Sexual Activity: No   Other Topics Concern  . Not on file   Social History Narrative     Objective: BP 130/84 mmHg  Pulse 93  Wt 174 lb (78.926 kg)  SpO2 97%  General: Alert and Oriented, No Acute Distress HEENT: Pupils equal, round, reactive to light. Conjunctivae clear.  External ears unremarkable, canals clear with intact TMs with appropriate landmarks.  Middle ear appears open without effusion. Pink inferior turbinates.  Moist mucous membranes, pharynx without inflammation nor lesions.  Neck supple without palpable lymphadenopathy nor abnormal masses. Lungs: comfortable work of breathing with no rales or wheezing. She does have some rhonchi in the central lung fields. Cardiac: Regular rate and rhythm. Normal S1/S2.  No murmurs, rubs, nor gallops.   Extremities: No  peripheral edema.  Strong peripheral pulses.  Mental Status: No depression, anxiety, nor agitation. Skin: Warm and dry.  Assessment & Plan: Randeep was seen today for shortness of breath.  Diagnoses and all orders for this visit:  COPD exacerbation ([place]) -     ipratropium-albuterol (DUONEB) 0.5-2.5 (3) MG/3ML SOLN; Inhale 56mL every four to six hours as needed for cough, shortness of breath, or wheezing. Use via nebulizer.  Chronic bronchitis, unspecified chronic bronchitis type ([place]) -     ipratropium-albuterol (DUONEB) 0.5-2.5 (3) MG/3ML SOLN; Inhale 90mL every four to six hours as needed for cough, shortness of breath, or wheezing. Use via nebulizer. -     methylPREDNISolone acetate (DEPO-MEDROL) injection 80 mg; Inject 1 mL (80 mg total) into the muscle once.   COPD exacerbation: She would prefer methylprednisone as opposed to prednisone burst. She was also given a nebulizer today with duo nebs that she can do at home. I've asked her to call me if she is worsening and if no better on Friday also let me know so I can call in an antibiotic.   Return if symptoms worsen or fail to improve.

## 2015-11-15 ENCOUNTER — Ambulatory Visit: Payer: Medicare Other

## 2015-11-18 ENCOUNTER — Ambulatory Visit: Payer: Medicare Other | Admitting: Family Medicine

## 2015-11-19 ENCOUNTER — Telehealth: Payer: Self-pay | Admitting: Family Medicine

## 2015-11-19 ENCOUNTER — Ambulatory Visit (INDEPENDENT_AMBULATORY_CARE_PROVIDER_SITE_OTHER): Payer: Medicare Other | Admitting: Family Medicine

## 2015-11-19 ENCOUNTER — Encounter: Payer: Self-pay | Admitting: Family Medicine

## 2015-11-19 VITALS — BP 128/94 | HR 68 | Wt 188.0 lb

## 2015-11-19 DIAGNOSIS — S4992XA Unspecified injury of left shoulder and upper arm, initial encounter: Secondary | ICD-10-CM

## 2015-11-19 DIAGNOSIS — M75102 Unspecified rotator cuff tear or rupture of left shoulder, not specified as traumatic: Secondary | ICD-10-CM

## 2015-11-19 DIAGNOSIS — R791 Abnormal coagulation profile: Secondary | ICD-10-CM | POA: Diagnosis not present

## 2015-11-19 DIAGNOSIS — Z7901 Long term (current) use of anticoagulants: Secondary | ICD-10-CM | POA: Diagnosis not present

## 2015-11-19 LAB — COMPLETE METABOLIC PANEL WITH GFR
ALK PHOS: 89 U/L (ref 33–130)
ALT: 20 U/L (ref 6–29)
AST: 22 U/L (ref 10–35)
Albumin: 3.8 g/dL (ref 3.6–5.1)
BILIRUBIN TOTAL: 0.6 mg/dL (ref 0.2–1.2)
BUN: 10 mg/dL (ref 7–25)
CALCIUM: 8.8 mg/dL (ref 8.6–10.4)
CO2: 27 mmol/L (ref 20–31)
CREATININE: 0.75 mg/dL (ref 0.50–1.05)
Chloride: 104 mmol/L (ref 98–110)
GFR, Est African American: >89 mL/min (ref >=60)
GFR, Est Non African American: 89 mL/min (ref >=60)
GLUCOSE: 135 mg/dL — AB (ref 65–99)
POTASSIUM: 3.9 mmol/L (ref 3.5–5.3)
Sodium: 137 mmol/L (ref 135–146)
Total Protein: 6.5 g/dL (ref 6.1–8.1)

## 2015-11-19 LAB — CBC
HCT: 43.5 % (ref 36.0–46.0)
Hemoglobin: 14.1 g/dL (ref 12.0–15.0)
MCH: 27.6 pg (ref 26.0–34.0)
MCHC: 32.4 g/dL (ref 30.0–36.0)
MCV: 85.1 fL (ref 78.0–100.0)
MPV: 9.6 fL (ref 8.6–12.4)
PLATELETS: 259 10*3/uL (ref 150–400)
RBC: 5.11 MIL/uL (ref 3.87–5.11)
RDW: 15.4 % (ref 11.5–15.5)
WBC: 5.9 10*3/uL (ref 4.0–10.5)

## 2015-11-19 LAB — PROTIME-INR
INR: 3.34 — AB (ref <1.50)
PROTHROMBIN TIME: 34.4 s — AB (ref 11.6–15.2)

## 2015-11-19 LAB — POCT INR: INR: 4.5

## 2015-11-19 MED ORDER — CLONAZEPAM 0.5 MG PO TABS: ORAL_TABLET | ORAL | Status: DC

## 2015-11-19 MED ORDER — OXYCODONE-ACETAMINOPHEN 10-325 MG PO TABS: 1.0000 | ORAL_TABLET | Freq: Four times a day (QID) | ORAL | Status: DC | PRN

## 2015-11-19 NOTE — Assessment & Plan Note (Signed)
Likely due to partial rotator cuff tear. Will schedule MRI in open MRI due to claustrophobia. Increase Klonopin prescription to allow 2-3 pills before MRI. Follow up after MRI. Continue OTC and prescription pain management.

## 2015-11-19 NOTE — Telephone Encounter (Signed)
Refill req 

## 2015-11-19 NOTE — Progress Notes (Signed)
Janet Joyce is a 59 y.o. female who presents to [facility] today for follow up L shoulder pain.   Patient tripped and fell at home on 11/05/2015. She landed on her left side. She was taken by EMS to the emergency department in [place]. She was evaluated by Dr. Eulas Post. X-rays were negative. She was diagnosed with contusion and possible rotator cuff injury. She was given a sling for comfort.   She notes continued severe 8/10 pain especially in her left shoulder to her elbow. She denies any radiating pain, paresthesia or weakness. She does note intermittent left hand weakness. She says staying still makes it better. She has bumped into walls here and there and that makes it significantly worse. She has been wearing her brace which has not helped much/ She is taking 2 Norco every 6 hours, though initially stated she was taking 4-5 per hour, then corrected to say every 6 hours. She does not have any pacemaker, transducers or pumps in her body but does have a mechanical valve which she has scanned into her chart in the past.    Past Medical History  Diagnosis Date  . Heart attack ([place])   . Hyperlipidemia   . Depression   . CHF (congestive heart failure) ([facility])    No past surgical history on file. Social History  Substance Use Topics  . Smoking status: Current Every Day Smoker -- 0.50 packs/day    Types: Cigarettes  . Smokeless tobacco: Not on file  . Alcohol Use: No   family history includes Diabetes in her father.  ROS as above Medications: Current Outpatient Prescriptions  Medication Sig Dispense Refill  . albuterol (PROVENTIL HFA;VENTOLIN HFA) 108 (90 BASE) MCG/ACT inhaler Inhale two puffs every 4-6 hours only as needed for shortness of breath or wheezing. 1 Inhaler 1  . ARIPiprazole (ABILIFY PO) Take by mouth.    [name] Kitchen azelastine (OPTIVAR) 0.05 % ophthalmic solution Place 1 drop into both eyes 2 (two) times daily. 6 mL 12  . BuPROPion HCl  (WELLBUTRIN PO) Take by mouth.    . carvedilol (COREG) 3.125 MG tablet TAKE 1 TABLET BY MOUTH TWICE DAILY 60 tablet 5  . chloridazePOXIDE-amitriptyline (LIMBITROL) 5-12.5 MG per tablet Take 1 tablet by mouth 3 (three) times daily as needed (reflux). 90 tablet 2  . clonazePAM (KLONOPIN) 0.5 MG tablet 1 PO TID and 2 QHS 150 tablet 0  . ezetimibe (ZETIA) 10 MG tablet Take 1 tablet (10 mg total) by mouth daily. 1 tablet 0  . furosemide (LASIX) 20 MG tablet One to two times a day (6 hours apart) as needed for swelling. 60 tablet 1  . Ipratropium-Albuterol (COMBIVENT RESPIMAT) 20-100 MCG/ACT AERS respimat Inhale 1 puff into the lungs every 6 (six) hours. 1 Inhaler 0  . ipratropium-albuterol (DUONEB) 0.5-2.5 (3) MG/3ML SOLN Inhale 6mL every four to six hours as needed for cough, shortness of breath, or wheezing. Use via nebulizer. 130 mL 11  . isosorbide mononitrate (IMDUR) 30 MG 24 hr tablet Take 1 tablet (30 mg total) by mouth daily. 1 tablet 0  . olopatadine (PATANOL) 0.1 % ophthalmic solution Place 1 drop into the right eye 2 (two) times daily. 5 mL 1  . omeprazole (PRILOSEC) 40 MG capsule One by mouth daily at least one hour before a meal. 30 capsule 2  . potassium chloride SA (K-DUR,KLOR-CON) 20 MEQ tablet TAKE 2 TABLETS BY MOUTH DAILY 180 tablet 3  . RisperiDONE (RISPERDAL PO) Take by mouth.    [name] Kitchen  rosuvastatin (CRESTOR) 40 MG tablet Take 1 tablet (40 mg total) by mouth daily. 90 tablet 3  . sertraline (ZOLOFT) 100 MG tablet Take 100 mg by mouth daily.    . Tiotropium Bromide Monohydrate (SPIRIVA RESPIMAT) 2.5 MCG/ACT AERS Two puffs daily. 1 Inhaler 0  . traMADol (ULTRAM) 50 MG tablet Take 1 tablet (50 mg total) by mouth every 8 (eight) hours as needed. for pain 90 tablet 2  . traZODone (DESYREL) 50 MG tablet Take 50 mg by mouth at bedtime.    [name] Kitchen warfarin (COUMADIN) 2.5 MG tablet TAKE 1 TABLET(2.5 MG) BY MOUTH DAILY 30 tablet 0  . warfarin (COUMADIN) 3 MG tablet Take 1 tablet (3 mg total) by mouth  daily. Based on most recent INR 30 tablet 1  . clonazePAM (KLONOPIN) 0.5 MG tablet Take 1-3 tablets one hour prior to MRI. Do not drive 6 tablet 0  . oxyCODONE-acetaminophen (PERCOCET) 10-325 MG tablet Take 1 tablet by mouth every 6 (six) hours as needed for pain. 120 tablet 0   No current facility-administered medications for this visit.   Allergies  Allergen Reactions  . Ciprofloxacin   . Erythrocin [Erythromycin]   . Hydromet [Hydrocodone-Homatropine]   . Keflex [Cephalexin]   . Lipitor [Atorvastatin]     Oral ulcers  . Morphine And Related   . Niacin And Related     Intolerable Flushing  . Septra [Sulfamethoxazole-Trimethoprim] Nausea And Vomiting and Other (See Comments)     Exam:  BP 128/94 mmHg  Pulse 68  Wt 188 lb (85.276 kg)  SpO2 99% Gen: Chronically ill-appearing lady appearing uncomfortable in NAD in the chair  HEENT: EOMI,  MMM Lungs: Normal work of breathing. CTABL Heart: RRR no MRG Abd: NABS, Soft. Nondistended, Nontender Back: Left inferior flank/back 30cmX5cm ecchymoses with 1-2cm diameter clearing Exts: Brisk capillary refill, warm and well perfused.  MSK: Neck: Full ROM, negative spurlings  L shoulder No visible deformities or ecchymoses in L shoulder Diffusely tender throughout shoulder, no abnormalities palpated ROM <5 degrees in flexion, unable to perform examination further due to pain Strength: gip assessed, rest of strength exam deferred due to pain  Psych: flat affect, tearful at times   Results for orders placed or performed in visit on 11/19/15 (from the past 24 hour(s))  POCT INR     Status: Abnormal   Collection Time: 11/19/15  1:19 PM  Result Value Ref Range   INR 4.5    No results found.   Please see individual assessment and plan sections.

## 2015-11-19 NOTE — Patient Instructions (Signed)
Thank you for coming in today. You were seen for your left shoulder pain.  Given your amount of pain, our physical exam and your negative XRays, we are worried you have torn some of your rotator cuff muscles in the shoulder. We recommend an MRI. We will schedule a more open MRI machine and prescribe additional Klonopin for claustrophobia. We will get some labs given your INR was elevated today.  Please follow up with [place] after MRI.

## 2015-11-19 NOTE — Assessment & Plan Note (Addendum)
Possibly due to liver injury secondary to [place] given inconsistent report on amount taken. Will check acetaminophen level as well as LFTs. Need to do INR/PT blood draw given elevated at [facility].

## 2015-11-20 LAB — ACETAMINOPHEN LEVEL

## 2015-11-20 NOTE — Progress Notes (Signed)
Quick Note:  Normal, no changes. ______ 

## 2015-11-25 ENCOUNTER — Ambulatory Visit (INDEPENDENT_AMBULATORY_CARE_PROVIDER_SITE_OTHER): Payer: Medicare Other | Admitting: Family Medicine

## 2015-11-25 VITALS — BP 116/58 | HR 74 | Resp 16

## 2015-11-25 DIAGNOSIS — Z954 Presence of other heart-valve replacement: Secondary | ICD-10-CM | POA: Diagnosis not present

## 2015-11-25 DIAGNOSIS — Z951 Presence of aortocoronary bypass graft: Secondary | ICD-10-CM | POA: Diagnosis not present

## 2015-11-25 DIAGNOSIS — I481 Persistent atrial fibrillation: Secondary | ICD-10-CM

## 2015-11-25 DIAGNOSIS — Z952 Presence of prosthetic heart valve: Secondary | ICD-10-CM

## 2015-11-25 DIAGNOSIS — I4819 Other persistent atrial fibrillation: Secondary | ICD-10-CM

## 2015-11-25 DIAGNOSIS — I4891 Unspecified atrial fibrillation: Secondary | ICD-10-CM | POA: Diagnosis not present

## 2015-11-25 LAB — PROTIME-INR
INR: 4.3 — AB (ref <1.50)
PROTHROMBIN TIME: 41.6 s — AB (ref 11.6–15.2)

## 2015-11-25 NOTE — Progress Notes (Signed)
Janet Joyce, Will you please let patient know that her INR is still in the therapeutic range.  She can space out rechecks to every 4 weeks now. Can you also ask her what coumadin regimen she's currently using, I've lost track of what she's taking since she's seen multiple providers these past three months.

## 2015-11-25 NOTE — Progress Notes (Signed)
Pt.notified

## 2015-11-25 NOTE — Progress Notes (Signed)
Pt advised.  2.5 mg on Tuesdays and Thursdays 3 mg all other days

## 2015-11-27 ENCOUNTER — Ambulatory Visit: Payer: Medicare Other | Admitting: Family Medicine

## 2015-11-28 ENCOUNTER — Ambulatory Visit (INDEPENDENT_AMBULATORY_CARE_PROVIDER_SITE_OTHER): Payer: Medicare Other | Admitting: Family Medicine

## 2015-11-28 ENCOUNTER — Encounter: Payer: Self-pay | Admitting: Family Medicine

## 2015-11-28 VITALS — BP 146/88 | HR 77 | Wt 176.0 lb

## 2015-11-28 DIAGNOSIS — S4992XA Unspecified injury of left shoulder and upper arm, initial encounter: Secondary | ICD-10-CM | POA: Diagnosis not present

## 2015-11-28 NOTE — Patient Instructions (Addendum)
Thank you for coming in today. We are trying to arrange for an MRI with sedation.  Return following MRI.  We should be calling you tomorrow with the information.

## 2015-11-28 NOTE — Progress Notes (Signed)
Janet Joyce is a 59 y.o. female who presents to [facility] today for left shoulder pain. Patient was seen on Dec 6th to follow up her left shoulder pain and injury. She was scheduled for an open MRI with oral sedation. She was unable to even start the MRI due to severe claustrophobia. She continues to have shoulder pain.   Past Medical History  Diagnosis Date  . Heart attack ([place])   . Hyperlipidemia   . Depression   . CHF (congestive heart failure) ([facility])    No past surgical history on file. Social History  Substance Use Topics  . Smoking status: Current Every Day Smoker -- 0.50 packs/day    Types: Cigarettes  . Smokeless tobacco: Not on file  . Alcohol Use: No   family history includes Diabetes in her father.  ROS as above Medications: Current Outpatient Prescriptions  Medication Sig Dispense Refill  . albuterol (PROVENTIL HFA;VENTOLIN HFA) 108 (90 BASE) MCG/ACT inhaler Inhale two puffs every 4-6 hours only as needed for shortness of breath or wheezing. 1 Inhaler 1  . ARIPiprazole (ABILIFY PO) Take by mouth.    [name] Kitchen azelastine (OPTIVAR) 0.05 % ophthalmic solution Place 1 drop into both eyes 2 (two) times daily. 6 mL 12  . BuPROPion HCl (WELLBUTRIN PO) Take by mouth.    . carvedilol (COREG) 3.125 MG tablet TAKE 1 TABLET BY MOUTH TWICE DAILY 60 tablet 5  . chloridazePOXIDE-amitriptyline (LIMBITROL) 5-12.5 MG per tablet Take 1 tablet by mouth 3 (three) times daily as needed (reflux). 90 tablet 2  . clonazePAM (KLONOPIN) 0.5 MG tablet 1 PO TID and 2 QHS 150 tablet 0  . clonazePAM (KLONOPIN) 0.5 MG tablet Take 1-3 tablets one hour prior to MRI. Do not drive 6 tablet 0  . ezetimibe (ZETIA) 10 MG tablet Take 1 tablet (10 mg total) by mouth daily. 1 tablet 0  . furosemide (LASIX) 20 MG tablet One to two times a day (6 hours apart) as needed for swelling. 60 tablet 1  .  Ipratropium-Albuterol (COMBIVENT RESPIMAT) 20-100 MCG/ACT AERS respimat Inhale 1 puff into the lungs every 6 (six) hours. 1 Inhaler 0  . ipratropium-albuterol (DUONEB) 0.5-2.5 (3) MG/3ML SOLN Inhale 66mL every four to six hours as needed for cough, shortness of breath, or wheezing. Use via nebulizer. 130 mL 11  . isosorbide mononitrate (IMDUR) 30 MG 24 hr tablet Take 1 tablet (30 mg total) by mouth daily. 1 tablet 0  . olopatadine (PATANOL) 0.1 % ophthalmic solution Place 1 drop into the right eye 2 (two) times daily. 5 mL 1  . omeprazole (PRILOSEC) 40 MG capsule One by mouth daily at least one hour before a meal. 30 capsule 2  . oxyCODONE-acetaminophen (PERCOCET) 10-325 MG tablet Take 1 tablet by mouth every 6 (six) hours as needed for pain. 120 tablet 0  . potassium chloride SA (K-DUR,KLOR-CON) 20 MEQ tablet TAKE 2 TABLETS BY MOUTH DAILY 180 tablet 3  . RisperiDONE (RISPERDAL PO) Take by mouth.    . rosuvastatin (CRESTOR) 40 MG tablet Take 1 tablet (40 mg total) by mouth daily. 90 tablet 3  . sertraline (ZOLOFT) 100 MG tablet Take 100 mg by mouth daily.    . Tiotropium Bromide Monohydrate (SPIRIVA RESPIMAT) 2.5 MCG/ACT AERS Two puffs daily. 1 Inhaler 0  . traMADol (ULTRAM) 50 MG tablet Take 1 tablet (50 mg total) by mouth every 8 (eight) hours as needed. for pain 90 tablet 2  .  traZODone (DESYREL) 50 MG tablet Take 50 mg by mouth at bedtime.    [name] Kitchen warfarin (COUMADIN) 2.5 MG tablet TAKE 1 TABLET(2.5 MG) BY MOUTH DAILY 30 tablet 0  . warfarin (COUMADIN) 3 MG tablet Take 1 tablet (3 mg total) by mouth daily. Based on most recent INR 30 tablet 1   No current facility-administered medications for this visit.   Allergies  Allergen Reactions  . Ciprofloxacin   . Erythrocin [Erythromycin]   . Hydromet [Hydrocodone-Homatropine]   . Keflex [Cephalexin]   . Lipitor [Atorvastatin]     Oral ulcers  . Morphine And Related   . Niacin And Related     Intolerable Flushing  . Septra  [Sulfamethoxazole-Trimethoprim] Nausea And Vomiting and Other (See Comments)     Exam:  BP 146/88 mmHg  Pulse 77  Wt 176 lb (79.833 kg)  SpO2 100% Gen: Well NAD Left shoulder is normal-appearing. Nontender to palpation. Range of motion significantly limited by pain. Forward flexion to 70 abduction to 70. Internal rotation to the hip and external rotation 30 beyond neutral position by pain. Strength is impaired by pain. Capillary refill sensation and pulses are intact distally.  No results found for this or any previous visit (from the past 24 hour(s)). No results found.   Please see individual assessment and plan sections.

## 2015-11-28 NOTE — Assessment & Plan Note (Signed)
Concern for rotator cuff tear. Will attempt to obtain an IV sedation MRI in the near future

## 2015-11-29 ENCOUNTER — Ambulatory Visit: Payer: Medicare Other | Admitting: Sports Medicine

## 2015-12-03 ENCOUNTER — Ambulatory Visit: Payer: Medicare Other

## 2015-12-05 ENCOUNTER — Ambulatory Visit (INDEPENDENT_AMBULATORY_CARE_PROVIDER_SITE_OTHER): Payer: Medicare Other | Admitting: Sports Medicine

## 2015-12-05 ENCOUNTER — Other Ambulatory Visit: Payer: Self-pay | Admitting: Family Medicine

## 2015-12-05 VITALS — BP 100/64

## 2015-12-05 DIAGNOSIS — M75102 Unspecified rotator cuff tear or rupture of left shoulder, not specified as traumatic: Secondary | ICD-10-CM

## 2015-12-05 DIAGNOSIS — I481 Persistent atrial fibrillation: Secondary | ICD-10-CM | POA: Diagnosis not present

## 2015-12-05 DIAGNOSIS — Z951 Presence of aortocoronary bypass graft: Secondary | ICD-10-CM

## 2015-12-05 DIAGNOSIS — Z952 Presence of prosthetic heart valve: Secondary | ICD-10-CM

## 2015-12-05 DIAGNOSIS — S4992XA Unspecified injury of left shoulder and upper arm, initial encounter: Secondary | ICD-10-CM

## 2015-12-05 DIAGNOSIS — I4819 Other persistent atrial fibrillation: Secondary | ICD-10-CM

## 2015-12-05 DIAGNOSIS — Z954 Presence of other heart-valve replacement: Secondary | ICD-10-CM | POA: Diagnosis not present

## 2015-12-05 LAB — POCT INR: INR: 2.2

## 2015-12-06 NOTE — Progress Notes (Signed)
   Subjective:    Patient ID: Janet Joyce, female    DOB: January 05, 1956, 59 y.o.   MRN: [id number] Pt notified to stay on current coumadin dosing & to return in 2 weeks to recheck.  Beatris Ship, CMA HPI    Review of Systems     Objective:   Physical Exam        Assessment & Plan:

## 2015-12-14 ENCOUNTER — Ambulatory Visit (HOSPITAL_BASED_OUTPATIENT_CLINIC_OR_DEPARTMENT_OTHER)
Admission: RE | Admit: 2015-12-14 | Discharge: 2015-12-14 | Disposition: A | Discharge status: Home / Self Care | Payer: Medicare Other | Source: Ambulatory Visit | Attending: Family Medicine | Admitting: Family Medicine

## 2015-12-14 DIAGNOSIS — M25512 Pain in left shoulder: Secondary | ICD-10-CM | POA: Diagnosis not present

## 2015-12-14 DIAGNOSIS — M75102 Unspecified rotator cuff tear or rupture of left shoulder, not specified as traumatic: Secondary | ICD-10-CM

## 2015-12-14 DIAGNOSIS — W19XXXA Unspecified fall, initial encounter: Secondary | ICD-10-CM | POA: Diagnosis not present

## 2015-12-14 DIAGNOSIS — S42295A Other nondisplaced fracture of upper end of left humerus, initial encounter for closed fracture: Secondary | ICD-10-CM | POA: Insufficient documentation

## 2015-12-14 DIAGNOSIS — S4992XA Unspecified injury of left shoulder and upper arm, initial encounter: Secondary | ICD-10-CM

## 2015-12-16 ENCOUNTER — Other Ambulatory Visit: Payer: Self-pay | Admitting: Family Medicine

## 2015-12-17 ENCOUNTER — Other Ambulatory Visit: Payer: Self-pay | Admitting: Family Medicine

## 2015-12-17 DIAGNOSIS — M75102 Unspecified rotator cuff tear or rupture of left shoulder, not specified as traumatic: Secondary | ICD-10-CM

## 2015-12-17 DIAGNOSIS — S4992XA Unspecified injury of left shoulder and upper arm, initial encounter: Secondary | ICD-10-CM

## 2015-12-18 ENCOUNTER — Encounter: Payer: Self-pay | Admitting: Family Medicine

## 2015-12-18 ENCOUNTER — Ambulatory Visit (INDEPENDENT_AMBULATORY_CARE_PROVIDER_SITE_OTHER): Payer: Medicare Other | Admitting: Family Medicine

## 2015-12-18 VITALS — BP 118/82 | HR 80 | Wt 178.0 lb

## 2015-12-18 DIAGNOSIS — Z7901 Long term (current) use of anticoagulants: Secondary | ICD-10-CM | POA: Diagnosis not present

## 2015-12-18 DIAGNOSIS — R791 Abnormal coagulation profile: Secondary | ICD-10-CM | POA: Diagnosis not present

## 2015-12-18 DIAGNOSIS — S42292A Other displaced fracture of upper end of left humerus, initial encounter for closed fracture: Secondary | ICD-10-CM

## 2015-12-18 LAB — POCT INR: INR: 3.9

## 2015-12-18 NOTE — Assessment & Plan Note (Signed)
Refer to Dr. Ballard Russell for surgical consultation. This fracture is near the anatomical neck and may be subject to high risk of avascular necrosis.  Appreciate insight and recommendations. In the meantime we'll order a cuff and collar sling. Return in 2 weeks or so for recheck.

## 2015-12-18 NOTE — Patient Instructions (Signed)
Thank you for coming in today.  We will order a cuff and collar sling.  We will get you an appointment with Dr. Ballard Russell or his associates.  Return after you see Dr. Ballard Russell.  Recheck your warfarin in about 2-3 weeks.   Shoulder Fracture (Proximal Humerus or Glenoid) A shoulder fracture is a broken upper arm bone or a broken socket bone. The humerus is the upper arm bone and the glenoid is the shoulder socket. Proximal means the humerus is broken near the shoulder. Most of the time the bones of a broken shoulder are in an acceptable position. Usually, the injury can be treated with a shoulder immobilizer or sling and swath bandage. These devices support the arm and prevent any shoulder movement. If the bones are not in a good position, then surgery is sometimes needed. Shoulder fractures usually initially cause swelling, pain, and discoloration around the upper arm. They heal in 8 to 12 weeks with proper treatment. SYMPTOMS  At the time of injury:  Pain.  Tenderness.  Regular body contours are not normal. Later symptoms may include:  Swelling and bruising of the elbow and hand.  Swelling and bruising of the arm or chest. Other symptoms include:  Pain when lifting or turning the arm.  Paralysis below the fracture.  Numbness or coldness below the fracture. CAUSES   Indirect force from falling on an outstretched arm.  A blow to the shoulder. RISK INCREASES WITH:  Not being in shape.  Playing contact sports, such as football, soccer, hockey, or rugby.  Sports where falling on an outstretched arm occurs, such as basketball, skateboarding, or volleyball.  History of bone or joint disease.  History of shoulder injury. PREVENTION  Warm up before activity.  Stretch before activity.  Stay in shape with your:  Heart fitness.  Flexibility.  Shoulder Strength.  Falling with the proper technique. PROGNOSIS  In adults, healing time is about 7 weeks. For children, healing time is  about 5 weeks. Surgery may be needed. RELATED COMPLICATIONS  The bones do not heal together (nonunion).  The bones do not align properly when they heal (malunion).  Long-term problems with pain, stiffness, swelling, or loss of motion.  The injured arm heals shorter than the other.  Nerves are injured in the arm.  Arthritis in the shoulder.  Normal bone growth is interrupted in children.  Blood supply to the shoulder joint is diminished. TREATMENT If the bones are aligned, then initial treatment will be with ice and medicine to help with pain. The shoulder will be held in place with a sling (immobilization). The shoulder will be allowed to heal for up to 6 weeks. Injuries that may need surgery include:  Severe fractures.  Fractures that are not in appropriate alignment (displaced).  Non-displaced fractures (not common). Surgery helps the bones align correctly. The bones may be held in place with:  Sutures.  Wires.  Rods.  Plates.  Screws.  Pins. If you have had surgery or not, you will likely be assisted by a physical therapist or athletic trainer to get the best results with your injured shoulder. This will likely include exercises to strengthen and stretch the injured and surrounding areas. MEDICATION  If pain medicine is needed, nonsteroidal anti-inflammatory medicines (such as aspirin or ibuprofen) or other minor pain relievers (such as acetaminophen) are often advised.  Do not take pain medicine for 7 days before surgery.  Stronger pain relievers may be prescribed. Use only as directed and take only as much  as you need. COLD THERAPY Cold treatment (icing) relieves pain and reduces inflammation. Cold treatment should be applied for 10 to 15 minutes every 2 to 3 hours, and immediately after activity that aggravates your symptoms. Use ice packs or an ice massage. SEEK IMMEDIATE MEDICAL CARE IF:  You have severe shoulder pain unrelieved by rest and taking pain  medicine.  You have pain, numbness, tingling, or weakness in the hand or wrist.  You have shortness of breath, chest pain, severe weakness, or fainting.  You have severe pain with motion of the fingers or wrist.  Blue, gray, or dark color appears in the fingernails on injured extremity.   This information is not intended to replace advice given to you by your health care provider. Make sure you discuss any questions you have with your health care provider.   Document Released: 11/30/2005 Document Revised: 02/22/2012 Document Reviewed: 03/14/2009 Elsevier Interactive Patient Education [employer].

## 2015-12-18 NOTE — Progress Notes (Signed)
Quick Note:  Normal, no changes. ______ 

## 2015-12-18 NOTE — Progress Notes (Signed)
Janet Joyce is a 60 y.o. female who presents to [facility] today for a low of MRI. Patient fell and suffered a shoulder injury a few weeks ago. She had a recent MRI which showed a hairline fracture at the humeral head near the anatomical neck. She has shoulder pain is continuing but improved. She denies any significant radiating pain weakness or numbness fevers or chills. She is not using a sling any longer. She is back to her usual dose of chronic pain medications.   Past Medical History  Diagnosis Date  . Heart attack ([place])   . Hyperlipidemia   . Depression   . CHF (congestive heart failure) (HCC)    No past surgical history on file. Social History  Substance Use Topics  . Smoking status: Current Every Day Smoker -- 0.50 packs/day    Types: Cigarettes  . Smokeless tobacco: Not on file  . Alcohol Use: No   family history includes Diabetes in her father.  ROS as above Medications: Current Outpatient Prescriptions  Medication Sig Dispense Refill  . albuterol (PROVENTIL HFA;VENTOLIN HFA) 108 (90 BASE) MCG/ACT inhaler Inhale two puffs every 4-6 hours only as needed for shortness of breath or wheezing. 1 Inhaler 1  . ARIPiprazole (ABILIFY PO) Take by mouth.    [name] Kitchen azelastine (OPTIVAR) 0.05 % ophthalmic solution Place 1 drop into both eyes 2 (two) times daily. 6 mL 12  . carvedilol (COREG) 3.125 MG tablet TAKE 1 TABLET BY MOUTH TWICE DAILY 60 tablet 0  . chloridazePOXIDE-amitriptyline (LIMBITROL) 5-12.5 MG per tablet Take 1 tablet by mouth 3 (three) times daily as needed (reflux). 90 tablet 2  . clonazePAM (KLONOPIN) 0.5 MG tablet 1 PO TID and 2 QHS 150 tablet 0  . clonazePAM (KLONOPIN) 0.5 MG tablet Take 1-3 tablets one hour prior to MRI. Do not drive 6 tablet 0  . ezetimibe (ZETIA) 10 MG tablet Take 1 tablet (10 mg total) by mouth daily. 1 tablet 0  . Ipratropium-Albuterol  (COMBIVENT RESPIMAT) 20-100 MCG/ACT AERS respimat Inhale 1 puff into the lungs every 6 (six) hours. 1 Inhaler 0  . ipratropium-albuterol (DUONEB) 0.5-2.5 (3) MG/3ML SOLN Inhale 37mL every four to six hours as needed for cough, shortness of breath, or wheezing. Use via nebulizer. 130 mL 11  . isosorbide mononitrate (IMDUR) 30 MG 24 hr tablet Take 1 tablet (30 mg total) by mouth daily. 1 tablet 0  . oxyCODONE-acetaminophen (PERCOCET) 10-325 MG tablet Take 1 tablet by mouth every 6 (six) hours as needed for pain. 120 tablet 0  . potassium chloride SA (K-DUR,KLOR-CON) 20 MEQ tablet TAKE 2 TABLETS BY MOUTH DAILY 180 tablet 3  . RisperiDONE (RISPERDAL PO) Take by mouth.    . rosuvastatin (CRESTOR) 40 MG tablet Take 1 tablet (40 mg total) by mouth daily. 90 tablet 3  . sertraline (ZOLOFT) 100 MG tablet Take 100 mg by mouth daily.    . Tiotropium Bromide Monohydrate (SPIRIVA RESPIMAT) 2.5 MCG/ACT AERS Two puffs daily. 1 Inhaler 0  . traZODone (DESYREL) 50 MG tablet Take 50 mg by mouth at bedtime.    [name] Kitchen warfarin (COUMADIN) 2.5 MG tablet TAKE 1 TABLET(2.5 MG) BY MOUTH DAILY 30 tablet 0  . warfarin (COUMADIN) 3 MG tablet Take 1 tablet (3 mg total) by mouth daily. Based on most recent INR 30 tablet 1  . omeprazole (PRILOSEC) 40 MG capsule One by mouth daily at least one hour before a meal. 30 capsule 2  No current facility-administered medications for this visit.   Allergies  Allergen Reactions  . Ciprofloxacin   . Erythrocin [Erythromycin]   . Hydromet [Hydrocodone-Homatropine]   . Keflex [Cephalexin]   . Lipitor [Atorvastatin]     Oral ulcers  . Morphine And Related   . Niacin And Related     Intolerable Flushing  . Septra [Sulfamethoxazole-Trimethoprim] Nausea And Vomiting and Other (See Comments)     Exam:  BP 118/82 mmHg  Pulse 80  Wt 178 lb (80.74 kg) Gen: Well NAD Left shoulder is normal appearing and nontender. Significantly decreased motion due to pain. Pulses capillary refill  sensation intact distally.  Results for orders placed or performed in visit on 12/18/15 (from the past 24 hour(s))  POCT INR     Status: None   Collection Time: 12/18/15  2:22 PM  Result Value Ref Range   INR 3.9    No results found.   Please see individual assessment and plan sections.

## 2015-12-18 NOTE — Assessment & Plan Note (Signed)
Continue current warfarin regimen and recheck in a few weeks.

## 2015-12-19 DIAGNOSIS — F322 Major depressive disorder, single episode, severe without psychotic features: Secondary | ICD-10-CM | POA: Diagnosis not present

## 2015-12-21 ENCOUNTER — Inpatient Hospital Stay (HOSPITAL_BASED_OUTPATIENT_CLINIC_OR_DEPARTMENT_OTHER): Admission: RE | Admit: 2015-12-21 | Payer: Medicare Other | Source: Ambulatory Visit

## 2015-12-23 ENCOUNTER — Inpatient Hospital Stay (HOSPITAL_COMMUNITY)
Admission: RE | Admit: 2015-12-23 | Discharge: 2015-12-23 | Disposition: A | Discharge status: Home / Self Care | Payer: Medicare Other | Source: Ambulatory Visit

## 2015-12-23 NOTE — Pre-Procedure Instructions (Signed)
    Janet Joyce  12/23/2015      [id number] - [place], [place] - [address] AT [facility] [address] [place] [address] [place] [facility] [address] Phone: [phone] Fax: [phone]    Your procedure is scheduled on 12/26/15.  Report to [facility] Admitting at 8 A.M.  Call this number if you have problems the morning of surgery:  [phone]   Remember:  Do not eat food or drink liquids after midnight.  Take these medicines the morning of surgery with A SIP OF WATER --all inhalers,carvedilol,eye drops,imdur,oxycodone,zoloft,prilosec   Do not wear jewelry, make-up or nail polish.  Do not wear lotions, powders, or perfumes.  You may wear deodorant.  Do not shave 48 hours prior to surgery.  Men may shave face and neck.  Do not bring valuables to the hospital.  [facility] is not responsible for any belongings or valuables.  Contacts, dentures or bridgework may not be worn into surgery.  Leave your suitcase in the car.  After surgery it may be brought to your room.  For patients admitted to the hospital, discharge time will be determined by your treatment team.  Patients discharged the day of surgery will not be allowed to drive home.   Name and phone number of your driver:    Special instructions:    Please read over the following fact sheets that you were given. Pain Booklet, Coughing and Deep Breathing and Surgical Site Infection Prevention

## 2015-12-25 ENCOUNTER — Encounter (HOSPITAL_COMMUNITY): Payer: Self-pay | Admitting: Certified Registered Nurse Anesthetist

## 2015-12-25 ENCOUNTER — Ambulatory Visit (INDEPENDENT_AMBULATORY_CARE_PROVIDER_SITE_OTHER): Payer: Medicare Other | Admitting: Family Medicine

## 2015-12-25 ENCOUNTER — Encounter: Payer: Self-pay | Admitting: Family Medicine

## 2015-12-25 VITALS — Wt 179.0 lb

## 2015-12-25 DIAGNOSIS — I4819 Other persistent atrial fibrillation: Secondary | ICD-10-CM

## 2015-12-25 DIAGNOSIS — I481 Persistent atrial fibrillation: Secondary | ICD-10-CM

## 2015-12-25 DIAGNOSIS — R05 Cough: Secondary | ICD-10-CM | POA: Diagnosis not present

## 2015-12-25 DIAGNOSIS — R059 Cough, unspecified: Secondary | ICD-10-CM

## 2015-12-25 MED ORDER — OXYCODONE-ACETAMINOPHEN 10-325 MG PO TABS: 1.0000 | ORAL_TABLET | Freq: Four times a day (QID) | ORAL | Status: DC | PRN

## 2015-12-25 MED ORDER — BUDESONIDE-FORMOTEROL FUMARATE 160-4.5 MCG/ACT IN AERO: 2.0000 | INHALATION_SPRAY | Freq: Two times a day (BID) | RESPIRATORY_TRACT | Status: DC

## 2015-12-25 NOTE — Progress Notes (Signed)
CC: Janet Joyce is a 60 y.o. female is here for Cough and Medication Refill   Subjective: HPI:  Complains of a cough for the past 2 weeks. She is trying to use albuterol but doesn't seem to be helping. It's present all hours of day occasionally wake her up at night. It is nonproductive. She denies any shortness of breath or wheezing. She denies chest pain. She denies any nasal congestion, sore throat or dysphagia.  She is requesting a refill on her Percocet, she's having to take 2 of these every night to fall asleep due to her left shoulder pain. She has an appointment with Dr. pill set up next week.   Review Of Systems Outlined In HPI  Past Medical History  Diagnosis Date  . Heart attack ([place])   . Hyperlipidemia   . Depression   . CHF (congestive heart failure) (HCC)     No past surgical history on file. Family History  Problem Relation Age of Onset  . Diabetes Father     Social History   Social History  . Marital Status: Divorced    Spouse Name: N/A  . Number of Children: N/A  . Years of Education: N/A   Occupational History  . Not on file.   Social History Main Topics  . Smoking status: Current Every Day Smoker -- 0.50 packs/day    Types: Cigarettes  . Smokeless tobacco: Not on file  . Alcohol Use: No  . Drug Use: No  . Sexual Activity: No   Other Topics Concern  . Not on file   Social History Narrative     Objective: BP   Wt 179 lb (81.194 kg)  General: Alert and Oriented, No Acute Distress HEENT: Pupils equal, round, reactive to light. Conjunctivae clear.  Moist mucous membranes Lungs: Clear to auscultation bilaterally, no wheezing/ronchi/rales.  Comfortable work of breathing. Good air movement. Cardiac: Irregularly irregular rhythm less than 100 bpm Extremities: No peripheral edema.  Strong peripheral pulses.  Mental Status: No depression, anxiety, nor agitation. Skin: Warm and dry.  Assessment & Plan: Simonetta was seen today for cough and  medication refill.  Diagnoses and all orders for this visit:  Cough  Persistent atrial fibrillation ([place])  Other orders -     Discontinue: oxyCODONE-acetaminophen (PERCOCET) 10-325 MG tablet; Take 1 tablet by mouth every 6 (six) hours as needed for pain. -     budesonide-formoterol (SYMBICORT) 160-4.5 MCG/ACT inhaler; Inhale 2 puffs into the lungs 2 (two) times daily. -     oxyCODONE-acetaminophen (PERCOCET) 10-325 MG tablet; Take 1-2 tablets by mouth every 6 (six) hours as needed for pain.   Cough: Possible mild COPD exacerbation therefore starting Symbicort trial. If no better by Friday please call me and I will send in an antibiotic. Atrial fibrillation: INR today of 2.0, since a significant fluctuation from last week no change to coumadin 3.0 mg on Tuesdays and Thursdays 2.5 mg all other days and repeat INR in one week   No Follow-up on file.

## 2015-12-25 NOTE — Progress Notes (Signed)
Pt on OR schedule for MRI with anesthesia. Called pt and she states it was cancelled 2 weeks ago b/c she has had the MRI already done. MRI is aware that this was cancelled. I call the main OR desk and let them know.

## 2015-12-26 ENCOUNTER — Ambulatory Visit (HOSPITAL_COMMUNITY): Payer: Medicare Other

## 2015-12-26 ENCOUNTER — Encounter (HOSPITAL_COMMUNITY): Admission: RE | Payer: Self-pay | Source: Ambulatory Visit

## 2015-12-26 ENCOUNTER — Ambulatory Visit (HOSPITAL_COMMUNITY): Admission: RE | Admit: 2015-12-26 | Payer: Medicare Other | Source: Ambulatory Visit | Admitting: Family Medicine

## 2015-12-26 SURGERY — RADIOLOGY WITH ANESTHESIA
Anesthesia: General | Laterality: Left

## 2016-01-01 ENCOUNTER — Ambulatory Visit (INDEPENDENT_AMBULATORY_CARE_PROVIDER_SITE_OTHER): Payer: Medicare Other | Admitting: Family Medicine

## 2016-01-01 VITALS — BP 125/87 | HR 74 | Wt 180.0 lb

## 2016-01-01 DIAGNOSIS — Z951 Presence of aortocoronary bypass graft: Secondary | ICD-10-CM | POA: Diagnosis not present

## 2016-01-01 DIAGNOSIS — I481 Persistent atrial fibrillation: Secondary | ICD-10-CM

## 2016-01-01 DIAGNOSIS — Z954 Presence of other heart-valve replacement: Secondary | ICD-10-CM

## 2016-01-01 DIAGNOSIS — I4819 Other persistent atrial fibrillation: Secondary | ICD-10-CM

## 2016-01-01 DIAGNOSIS — Z952 Presence of prosthetic heart valve: Secondary | ICD-10-CM

## 2016-01-01 LAB — POCT INR: INR: 1.8

## 2016-01-01 NOTE — Progress Notes (Signed)
   Subjective:    Patient ID: Janet Joyce, female    DOB: 1956/11/23, 60 y.o.   MRN: [id number]  HPI  Patient here for INR check.  States she missed her dose last Friday and this past Monday   Review of Systems     Objective:   Physical Exam  BP 125/87 mmHg  Pulse 74  Wt 180 lb (81.647 kg)  SpO2 94%       Assessment & Plan:   Patient had INR of 1.8 Possibly due to her missed doses. Physician notified

## 2016-01-01 NOTE — Progress Notes (Signed)
Will you please let patient know that her INR continues to decrease below the goal.  I'd recommend she start taking 3mg  of coumadin every day, no more alternating. After one week of this please follow up for a repeat INR.

## 2016-01-01 NOTE — Progress Notes (Signed)
Pt advised.

## 2016-01-08 DIAGNOSIS — M7502 Adhesive capsulitis of left shoulder: Secondary | ICD-10-CM | POA: Diagnosis not present

## 2016-01-08 DIAGNOSIS — M25512 Pain in left shoulder: Secondary | ICD-10-CM | POA: Diagnosis not present

## 2016-01-10 ENCOUNTER — Encounter: Payer: Self-pay | Admitting: Family Medicine

## 2016-01-10 ENCOUNTER — Telehealth: Payer: Self-pay | Admitting: Family Medicine

## 2016-01-10 ENCOUNTER — Ambulatory Visit (INDEPENDENT_AMBULATORY_CARE_PROVIDER_SITE_OTHER): Payer: Medicare Other | Admitting: Family Medicine

## 2016-01-10 VITALS — BP 139/78 | Wt 181.0 lb

## 2016-01-10 DIAGNOSIS — I4891 Unspecified atrial fibrillation: Secondary | ICD-10-CM

## 2016-01-10 DIAGNOSIS — R059 Cough, unspecified: Secondary | ICD-10-CM

## 2016-01-10 DIAGNOSIS — R05 Cough: Secondary | ICD-10-CM | POA: Diagnosis not present

## 2016-01-10 LAB — POCT INR: INR: 1.7

## 2016-01-10 MED ORDER — AZITHROMYCIN 250 MG PO TABS: ORAL_TABLET | ORAL | Status: DC

## 2016-01-10 MED ORDER — WARFARIN SODIUM 7.5 MG PO TABS: ORAL_TABLET | ORAL | Status: DC

## 2016-01-10 MED ORDER — ROSUVASTATIN CALCIUM 40 MG PO TABS: 40.0000 mg | ORAL_TABLET | Freq: Every day | ORAL | Status: DC

## 2016-01-10 NOTE — Telephone Encounter (Signed)
Patient stated her insurance was not going to cover her rosuvastatin anymore so I did a tier exception through cover my meds to see if they will still cover it waiting on authorization. - CF

## 2016-01-10 NOTE — Progress Notes (Signed)
CC: Janet Joyce is a 60 y.o. female is here for Medication Refill and Anticoagulation   Subjective: HPI:  Follow-up atrial fibrillation: She's been taking 3 mg of Coumadin on a daily basis with 100% compliance. There's been no bleeding or bruising. She denies any shortness of breath but she has had a cough with chest congestion, facial pressure and fatigue for the past week. No new motor or sensory disturbances.  She needs some clarification on a letter she got from her insurance company stating that they'll no longer cover Crestor. She's been taking this due to intolerance to Lipitor. It's been working fantastically to get her LDL cholesterol under control   Review Of Systems Outlined In HPI  Past Medical History  Diagnosis Date  . Heart attack ([employer])   . Hyperlipidemia   . Depression   . CHF (congestive heart failure) (HCC)     No past surgical history on file. Family History  Problem Relation Age of Onset  . Diabetes Father     Social History   Social History  . Marital Status: Divorced    Spouse Name: N/A  . Number of Children: N/A  . Years of Education: N/A   Occupational History  . Not on file.   Social History Main Topics  . Smoking status: Current Every Day Smoker -- 0.50 packs/day    Types: Cigarettes  . Smokeless tobacco: Not on file  . Alcohol Use: No  . Drug Use: No  . Sexual Activity: No   Other Topics Concern  . Not on file   Social History Narrative     Objective: BP 139/78 mmHg  Wt 181 lb (82.101 kg)  Vital signs reviewed. General: Alert and Oriented, No Acute Distress HEENT: Pupils equal, round, reactive to light. Conjunctivae clear.  External ears unremarkable.  Moist mucous membranes. Lungs: Clear and comfortable work of breathing, speaking in full sentences without accessory muscle use. Cardiac: Irregularly irregular rhythm less than 100 bpm Neuro: CN II-XII grossly intact, gait normal. Extremities: No peripheral edema.  Strong  peripheral pulses.  Mental Status: No depression, anxiety, nor agitation. Logical though process. Skin: Warm and dry.  Assessment & Plan: Kamorie was seen today for medication refill and anticoagulation.  Diagnoses and all orders for this visit:  Atrial fibrillation, unspecified type ([place]) -     POCT INR  Cough  Other orders -     Cancel: warfarin (COUMADIN) 2.5 MG tablet;  -     Cancel: warfarin (COUMADIN) 3 MG tablet; Take 1 tablet (3 mg total) by mouth daily. Based on most recent INR -     warfarin (COUMADIN) 7.5 MG tablet; Half tablet by mouth daily based on INR -     azithromycin (ZITHROMAX) 250 MG tablet; Take two tabs at once on day 1, then one tab daily on days 2-5. -     rosuvastatin (CRESTOR) 40 MG tablet; Take 1 tablet (40 mg total) by mouth daily.    Contacting atnea regarding crestor not being on formulary, request exception Ictal fibrillation: Rate controlled but subtherapeutic and uncontrolled with respect to coagulation. Increasing Coumadin to half of a 7.5 mg tablet daily recheck INR 1 week Cough: Start azithromycin given smoking history.  Return in about 1 week (around 01/17/2016).

## 2016-01-14 ENCOUNTER — Other Ambulatory Visit: Payer: Self-pay | Admitting: Family Medicine

## 2016-01-15 ENCOUNTER — Ambulatory Visit (INDEPENDENT_AMBULATORY_CARE_PROVIDER_SITE_OTHER): Payer: Medicare Other | Admitting: Family Medicine

## 2016-01-15 ENCOUNTER — Other Ambulatory Visit: Payer: Self-pay

## 2016-01-15 DIAGNOSIS — I4891 Unspecified atrial fibrillation: Secondary | ICD-10-CM | POA: Diagnosis not present

## 2016-01-15 LAB — POCT INR: INR: 2

## 2016-01-15 MED ORDER — OXYCODONE-ACETAMINOPHEN 10-325 MG PO TABS: 1.0000 | ORAL_TABLET | Freq: Four times a day (QID) | ORAL | Status: DC | PRN

## 2016-01-15 MED ORDER — WARFARIN SODIUM 4 MG PO TABS: 4.0000 mg | ORAL_TABLET | Freq: Every day | ORAL | Status: DC

## 2016-01-15 NOTE — Progress Notes (Signed)
Pt.notified

## 2016-01-15 NOTE — Progress Notes (Signed)
Will you please let patient know that her INR is improving but still a few tenths below the goal of 2.5-3.5. I'd recommend changing her coumadin dose to 4mg  daily. I'll send a new Rx to her pharmacy. Repeat INR in one week.

## 2016-01-16 ENCOUNTER — Ambulatory Visit: Payer: Medicare Other

## 2016-01-20 DIAGNOSIS — M7502 Adhesive capsulitis of left shoulder: Secondary | ICD-10-CM | POA: Diagnosis not present

## 2016-01-20 DIAGNOSIS — M25512 Pain in left shoulder: Secondary | ICD-10-CM | POA: Diagnosis not present

## 2016-01-20 NOTE — Telephone Encounter (Signed)
Received fax and rosuvastatin will be covered through 12/13/2016. - CF

## 2016-01-23 ENCOUNTER — Ambulatory Visit (INDEPENDENT_AMBULATORY_CARE_PROVIDER_SITE_OTHER): Payer: Medicare Other | Admitting: Family Medicine

## 2016-01-23 VITALS — BP 140/82 | HR 75 | Wt 177.0 lb

## 2016-01-23 DIAGNOSIS — Z951 Presence of aortocoronary bypass graft: Secondary | ICD-10-CM

## 2016-01-23 DIAGNOSIS — Z954 Presence of other heart-valve replacement: Secondary | ICD-10-CM | POA: Diagnosis not present

## 2016-01-23 DIAGNOSIS — Z952 Presence of prosthetic heart valve: Secondary | ICD-10-CM

## 2016-01-23 DIAGNOSIS — I4819 Other persistent atrial fibrillation: Secondary | ICD-10-CM

## 2016-01-23 DIAGNOSIS — I481 Persistent atrial fibrillation: Secondary | ICD-10-CM

## 2016-01-23 LAB — POCT INR: INR: 3.6

## 2016-01-23 NOTE — Progress Notes (Signed)
Pt advised of recommendation, she has already taken today's dose so she will take half tab on Friday's and whole tab all other days. Verbalized she will come back in 2 weeks. No further questions.

## 2016-02-04 ENCOUNTER — Ambulatory Visit (INDEPENDENT_AMBULATORY_CARE_PROVIDER_SITE_OTHER): Payer: Medicare Other | Admitting: Family Medicine

## 2016-02-04 VITALS — BP 143/84 | HR 81 | Temp 97.7°F | Wt 176.0 lb

## 2016-02-04 DIAGNOSIS — Z954 Presence of other heart-valve replacement: Secondary | ICD-10-CM | POA: Diagnosis not present

## 2016-02-04 DIAGNOSIS — Z952 Presence of prosthetic heart valve: Secondary | ICD-10-CM

## 2016-02-04 DIAGNOSIS — I4891 Unspecified atrial fibrillation: Secondary | ICD-10-CM | POA: Diagnosis not present

## 2016-02-04 DIAGNOSIS — Z951 Presence of aortocoronary bypass graft: Secondary | ICD-10-CM

## 2016-02-04 DIAGNOSIS — I4819 Other persistent atrial fibrillation: Secondary | ICD-10-CM

## 2016-02-04 DIAGNOSIS — I481 Persistent atrial fibrillation: Secondary | ICD-10-CM | POA: Diagnosis not present

## 2016-02-04 LAB — POCT INR

## 2016-02-04 NOTE — Progress Notes (Signed)
Needs to have venipuncture, recommendations to follow.

## 2016-02-05 ENCOUNTER — Telehealth: Payer: Self-pay | Admitting: Family Medicine

## 2016-02-05 LAB — PROTIME-INR
INR: 3.78 — AB (ref <1.50)
Prothrombin Time: 37.9 seconds — ABNORMAL HIGH (ref 11.6–15.2)

## 2016-02-05 NOTE — Telephone Encounter (Signed)
Can you please let patient know that I'd recommend taking half of her 4mg  tablet on Tuesday and Thursday but a full tablet all other days and repeat INR in two weeks. Her INR was just slightly elevated above goal.

## 2016-02-05 NOTE — Telephone Encounter (Signed)
Pt.notified

## 2016-02-09 ENCOUNTER — Other Ambulatory Visit: Payer: Self-pay | Admitting: Family Medicine

## 2016-02-11 ENCOUNTER — Other Ambulatory Visit: Payer: Self-pay

## 2016-02-11 MED ORDER — OXYCODONE-ACETAMINOPHEN 10-325 MG PO TABS: 1.0000 | ORAL_TABLET | Freq: Four times a day (QID) | ORAL | Status: DC | PRN

## 2016-02-17 ENCOUNTER — Ambulatory Visit: Payer: Medicare Other | Admitting: Family Medicine

## 2016-02-18 ENCOUNTER — Ambulatory Visit: Payer: Medicare Other | Admitting: Family Medicine

## 2016-02-21 ENCOUNTER — Ambulatory Visit (INDEPENDENT_AMBULATORY_CARE_PROVIDER_SITE_OTHER): Payer: Medicare Other

## 2016-02-21 ENCOUNTER — Ambulatory Visit (INDEPENDENT_AMBULATORY_CARE_PROVIDER_SITE_OTHER): Payer: Medicare Other | Admitting: Family Medicine

## 2016-02-21 ENCOUNTER — Encounter: Payer: Self-pay | Admitting: Family Medicine

## 2016-02-21 VITALS — BP 123/78 | Wt 181.0 lb

## 2016-02-21 DIAGNOSIS — R05 Cough: Secondary | ICD-10-CM

## 2016-02-21 DIAGNOSIS — R0781 Pleurodynia: Secondary | ICD-10-CM

## 2016-02-21 DIAGNOSIS — I481 Persistent atrial fibrillation: Secondary | ICD-10-CM | POA: Diagnosis not present

## 2016-02-21 DIAGNOSIS — Z954 Presence of other heart-valve replacement: Secondary | ICD-10-CM | POA: Diagnosis not present

## 2016-02-21 DIAGNOSIS — I4819 Other persistent atrial fibrillation: Secondary | ICD-10-CM

## 2016-02-21 DIAGNOSIS — Z952 Presence of prosthetic heart valve: Secondary | ICD-10-CM

## 2016-02-21 LAB — POCT INR: INR: 2.6

## 2016-02-21 NOTE — Progress Notes (Signed)
CC: Janet Joyce is a 60 y.o. female is here for Chest Pain and Coagulation Disorder   Subjective: HPI:  Currently taking coumadin 4mg  tablet on Tuesday and Thursday but a full tablet all other days.  INR 2.6 today.  No bleeding or bruising reported. Denies motor sensory disturbances nor shortness of breath.  Her major complaint today is right and left side pain right greater than left. Happened 3 weeks ago and is slowly improving. She is worried that it could be something wrong with her lungs. Its worse with coughing, sneezing, or taking deep breaths. No interventions as of yet. She denies wheezing or new shortness of breath. She denies any recent or remote trauma that she knows of   Review Of Systems Outlined In HPI  Past Medical History  Diagnosis Date  . Heart attack ([place])   . Hyperlipidemia   . Depression   . CHF (congestive heart failure) (HCC)     No past surgical history on file. Family History  Problem Relation Age of Onset  . Diabetes Father     Social History   Social History  . Marital Status: Divorced    Spouse Name: N/A  . Number of Children: N/A  . Years of Education: N/A   Occupational History  . Not on file.   Social History Main Topics  . Smoking status: Current Every Day Smoker -- 0.50 packs/day    Types: Cigarettes  . Smokeless tobacco: Not on file  . Alcohol Use: No  . Drug Use: No  . Sexual Activity: No   Other Topics Concern  . Not on file   Social History Narrative     Objective: BP 123/78 mmHg  Wt 181 lb (82.101 kg)  General: Alert and Oriented, No Acute Distress HEENT: Pupils equal, round, reactive to light. Conjunctivae clear.  Moist mucous membranes Lungs: Clear to auscultation bilaterally, no wheezing/ronchi/rales.  Comfortable work of breathing. Good air movement. Cardiac: Regular rate and rhythm. Normal S1/S2.  No murmurs, rubs, nor gallops.   Abdomen: Pain is reproduced with palpation of the right 11th rib Extremities: No  peripheral edema.  Strong peripheral pulses.  Mental Status: No depression, anxiety, nor agitation. Skin: Warm and dry.  Assessment & Plan: Bertine was seen today for chest pain and coagulation disorder.  Diagnoses and all orders for this visit:  Persistent atrial fibrillation ([place])  H/O aortic valve replacement  Rib pain -     DG Chest 2 View; Future   Atrial fibrillation: Currently rate controlled and anticoagulated appropriately. Continue current dose of Coumadin Rib pain: Discussed may take up to a month for a bruised rib to heal on its own. She is worried that something dangerously is wrong with her lungs therefore a chest x-ray will be obtained in hopes of calming her nerves.  No Follow-up on file.

## 2016-02-24 ENCOUNTER — Telehealth: Payer: Self-pay

## 2016-02-24 MED ORDER — GUAIFENESIN-CODEINE 100-10 MG/5ML PO SOLN: 5.0000 mL | Freq: Three times a day (TID) | ORAL | Status: DC | PRN

## 2016-02-24 NOTE — Telephone Encounter (Signed)
Pt.notified

## 2016-02-24 NOTE — Telephone Encounter (Signed)
[name], Rx placed in in-box ready for pickup/faxing.  

## 2016-02-28 DIAGNOSIS — M25512 Pain in left shoulder: Secondary | ICD-10-CM | POA: Diagnosis not present

## 2016-02-28 DIAGNOSIS — M7502 Adhesive capsulitis of left shoulder: Secondary | ICD-10-CM | POA: Diagnosis not present

## 2016-03-09 ENCOUNTER — Other Ambulatory Visit: Payer: Self-pay

## 2016-03-09 ENCOUNTER — Telehealth: Payer: Self-pay | Admitting: Family Medicine

## 2016-03-09 ENCOUNTER — Other Ambulatory Visit: Payer: Self-pay | Admitting: Family Medicine

## 2016-03-09 MED ORDER — OXYCODONE-ACETAMINOPHEN 10-325 MG PO TABS: 1.0000 | ORAL_TABLET | Freq: Four times a day (QID) | ORAL | Status: DC | PRN

## 2016-03-09 NOTE — Telephone Encounter (Signed)
Rx printed and ready for pick up.  

## 2016-03-09 NOTE — Telephone Encounter (Signed)
Pt called and stated she needs her percocet refilled. She will be in Friday for her inr check and would like to pick it up then. Thanks

## 2016-03-12 DIAGNOSIS — F322 Major depressive disorder, single episode, severe without psychotic features: Secondary | ICD-10-CM | POA: Diagnosis not present

## 2016-03-13 ENCOUNTER — Ambulatory Visit (INDEPENDENT_AMBULATORY_CARE_PROVIDER_SITE_OTHER): Payer: Medicare Other | Admitting: Family Medicine

## 2016-03-13 VITALS — BP 131/70 | HR 76

## 2016-03-13 DIAGNOSIS — Z954 Presence of other heart-valve replacement: Secondary | ICD-10-CM

## 2016-03-13 DIAGNOSIS — I4891 Unspecified atrial fibrillation: Secondary | ICD-10-CM

## 2016-03-13 DIAGNOSIS — Z952 Presence of prosthetic heart valve: Secondary | ICD-10-CM

## 2016-03-13 DIAGNOSIS — Z951 Presence of aortocoronary bypass graft: Secondary | ICD-10-CM

## 2016-03-13 LAB — POCT INR: INR: 3

## 2016-03-13 NOTE — Progress Notes (Signed)
Will you please let patiet know that her INR was perfect today, no need to adjust her coumadin regimen. Repeat INR in one month

## 2016-03-13 NOTE — Progress Notes (Signed)
Pt.notified

## 2016-03-25 ENCOUNTER — Other Ambulatory Visit: Payer: Self-pay | Admitting: Family Medicine

## 2016-04-06 ENCOUNTER — Telehealth: Payer: Self-pay | Admitting: Family Medicine

## 2016-04-06 MED ORDER — OXYCODONE-ACETAMINOPHEN 10-325 MG PO TABS: 1.0000 | ORAL_TABLET | Freq: Four times a day (QID) | ORAL | Status: DC | PRN

## 2016-04-06 NOTE — Telephone Encounter (Signed)
[name], Rx placed in in-box ready for pickup/faxing.  

## 2016-04-06 NOTE — Telephone Encounter (Signed)
Pt.notified

## 2016-04-06 NOTE — Telephone Encounter (Signed)
Pt needs Percocet filled. She will be in Friday for nurse visit and will pick it up then. Thanks

## 2016-04-07 ENCOUNTER — Other Ambulatory Visit: Payer: Self-pay | Admitting: Family Medicine

## 2016-04-10 ENCOUNTER — Ambulatory Visit (INDEPENDENT_AMBULATORY_CARE_PROVIDER_SITE_OTHER): Payer: Medicare Other | Admitting: Family Medicine

## 2016-04-10 ENCOUNTER — Telehealth: Payer: Self-pay | Admitting: Family Medicine

## 2016-04-10 VITALS — BP 139/54 | HR 73

## 2016-04-10 DIAGNOSIS — Z954 Presence of other heart-valve replacement: Secondary | ICD-10-CM | POA: Diagnosis not present

## 2016-04-10 DIAGNOSIS — I4891 Unspecified atrial fibrillation: Secondary | ICD-10-CM

## 2016-04-10 DIAGNOSIS — Z951 Presence of aortocoronary bypass graft: Secondary | ICD-10-CM

## 2016-04-10 DIAGNOSIS — Z952 Presence of prosthetic heart valve: Secondary | ICD-10-CM

## 2016-04-10 LAB — POCT INR: INR: 3.4

## 2016-04-10 NOTE — Telephone Encounter (Signed)
Information put into pt FYI

## 2016-04-10 NOTE — Progress Notes (Signed)
Pt.notified

## 2016-04-10 NOTE — Telephone Encounter (Signed)
Dr. Idelle Crouch called and stated that the dynamap caused a bruise on her arm because it squeezes to tight and would like for you to place a note in her chart to inform the CMA's to only do her BP manually. - CF

## 2016-04-10 NOTE — Progress Notes (Signed)
Will you please let patiet know that her INR was perfect today, no need to adjust her coumadin regimen. Repeat INR in one month

## 2016-04-10 NOTE — Telephone Encounter (Signed)
[name], I don't know how to do this can you help?

## 2016-04-13 ENCOUNTER — Other Ambulatory Visit: Payer: Self-pay | Admitting: Family Medicine

## 2016-04-24 ENCOUNTER — Other Ambulatory Visit: Payer: Self-pay | Admitting: Family Medicine

## 2016-04-24 NOTE — Telephone Encounter (Signed)
Is this refill appropriate?  

## 2016-05-04 ENCOUNTER — Other Ambulatory Visit: Payer: Self-pay | Admitting: Family Medicine

## 2016-05-05 ENCOUNTER — Encounter: Payer: Self-pay | Admitting: Family Medicine

## 2016-05-05 ENCOUNTER — Ambulatory Visit (INDEPENDENT_AMBULATORY_CARE_PROVIDER_SITE_OTHER): Payer: Medicare Other | Admitting: Family Medicine

## 2016-05-05 VITALS — BP 149/85 | HR 71 | Ht 63.0 in | Wt 187.0 lb

## 2016-05-05 DIAGNOSIS — I4891 Unspecified atrial fibrillation: Secondary | ICD-10-CM

## 2016-05-05 DIAGNOSIS — Z952 Presence of prosthetic heart valve: Secondary | ICD-10-CM

## 2016-05-05 DIAGNOSIS — J439 Emphysema, unspecified: Secondary | ICD-10-CM

## 2016-05-05 DIAGNOSIS — Z954 Presence of other heart-valve replacement: Secondary | ICD-10-CM

## 2016-05-05 LAB — POCT INR: INR: 3.7

## 2016-05-05 MED ORDER — WARFARIN SODIUM 4 MG PO TABS: ORAL_TABLET | ORAL | Status: DC

## 2016-05-05 MED ORDER — GUAIFENESIN-CODEINE 100-10 MG/5ML PO SOLN: 5.0000 mL | Freq: Three times a day (TID) | ORAL | Status: DC | PRN

## 2016-05-05 MED ORDER — PREDNISONE 20 MG PO TABS: ORAL_TABLET | ORAL | Status: AC

## 2016-05-05 NOTE — Progress Notes (Signed)
CC: Janet Joyce is a 60 y.o. female is here for Cough   Subjective: HPI:  Taking coumadin 4mg  daily except for only half a tab, 2 mg, on Tuesday and Thursday. She's not noticed any new bleeding or bruising abnormalities. No irregular heartbeat, exertional chest pain, limb claudication.  She's been experiences a nonproductive cough for about 2 months now. She denies any production of sputum. She denies any blood being coughed up. She's had wheezing that does not respond to her nebulizer treatments. It's been accompanied by mild shortness of breath with exertion and all of the above symptoms seem to be worse when lying down at night. Has not been getting better or worse last month and is currently moderate in severity. The only thing that helped his her codeine cough syrup although only for a few hours. She denies fevers, chills or nasal congestion or sore throat   Review Of Systems Outlined In HPI  Past Medical History  Diagnosis Date  . Heart attack ([place])   . Hyperlipidemia   . Depression   . CHF (congestive heart failure) (HCC)     No past surgical history on file. Family History  Problem Relation Age of Onset  . Diabetes Father     Social History   Social History  . Marital Status: Divorced    Spouse Name: N/A  . Number of Children: N/A  . Years of Education: N/A   Occupational History  . Not on file.   Social History Main Topics  . Smoking status: Current Every Day Smoker -- 0.50 packs/day    Types: Cigarettes  . Smokeless tobacco: Not on file  . Alcohol Use: No  . Drug Use: No  . Sexual Activity: No   Other Topics Concern  . Not on file   Social History Narrative     Objective: BP 149/85 mmHg  Pulse 71  Ht 5\' 3"  (1.6 m)  Wt 187 lb (84.823 kg)  BMI 33.13 kg/m2  General: Alert and Oriented, No Acute Distress HEENT: Pupils equal, round, reactive to light. Conjunctivae clear.  External ears unremarkable, canals clear with intact TMs with appropriate  landmarks.  Middle ear appears open without effusion. Pink inferior turbinates.  Moist mucous membranes, pharynx without inflammation nor lesions.  Neck supple without palpable lymphadenopathy nor abnormal masses. Lungs: Clear to auscultation bilaterally, no wheezing/ronchi/rales.  Comfortable work of breathing. Good air movement. Occasional coughing  Cardiac: Regular rate and rhythm. Mechanical S2 Extremities: No peripheral edema.  Strong peripheral pulses.  Mental Status: No depression, anxiety, nor agitation. Skin: Warm and dry.  Assessment & Plan: Austin was seen today for cough.  Diagnoses and all orders for this visit:  H/O aortic valve replacement  Atrial fibrillation, unspecified type ([place]) -     POCT INR  Pulmonary emphysema, unspecified emphysema type (HCC) -     predniSONE (DELTASONE) 20 MG tablet; Three tabs daily days 1-3, two tabs daily days 4-6, one tab daily days 7-9, half tab daily days 10-13.  Other orders -     guaiFENesin-codeine 100-10 MG/5ML syrup; Take 5-10 mLs by mouth 3 (three) times daily as needed for cough. -     warfarin (COUMADIN) 4 MG tablet; TAKE 1 TABLET BY MOUTH DAILY. BASED ON RECENT INR.   Atrial fibrillation with aortic valve replacement: Continue current Coumadin regimen after stopping for 1 day. We'll be rechecking INR in 1 week. Pulmonary emphysema exacerbation: Start prednisone taper, when she comes in next week will see if she is  getting any better..Signs and symptoms requring emergent/urgent reevaluation were discussed with the patient.  Return in about 1 week (around 05/12/2016) for Nurse visit INR.

## 2016-05-07 ENCOUNTER — Telehealth: Payer: Self-pay | Admitting: Family Medicine

## 2016-05-07 ENCOUNTER — Telehealth: Payer: Self-pay | Admitting: *Deleted

## 2016-05-07 MED ORDER — PHENTERMINE HCL 30 MG PO TBDP: ORAL_TABLET | ORAL | Status: DC

## 2016-05-07 NOTE — Telephone Encounter (Signed)
Janet Joyce, Rx placed in in-box ready for pickup/faxing.  

## 2016-05-07 NOTE — Telephone Encounter (Signed)
Pt notified and is aware to schedule a nurse visit in a month for check of BP and wt. She understands that a nurse visit will be needed each month that she is on this medication to ensure that she is losing weight and that her BP is not elevated.Rx faxed

## 2016-05-07 NOTE — Telephone Encounter (Signed)
Pt called and states she and Dr. Ileene Rubens had discussed wt loss medication at a recent visit but declined. Pt states she would like to get a prescription for weight loss medication at this time. Please advise

## 2016-05-07 NOTE — Telephone Encounter (Signed)
[facility] called, the Rx for Phentermine 30mg  does not come in tablet form. Changed Rx to Phentermine 15mg  capsule with the directions: 1 capsule my mouth daily, may increase to 2 capsules daily if needed, quantity: 60, refill: 0.

## 2016-05-08 NOTE — Telephone Encounter (Signed)
Agree with decision

## 2016-05-13 ENCOUNTER — Ambulatory Visit (INDEPENDENT_AMBULATORY_CARE_PROVIDER_SITE_OTHER): Payer: Medicare Other | Admitting: Family Medicine

## 2016-05-13 VITALS — BP 130/72 | Wt 184.0 lb

## 2016-05-13 DIAGNOSIS — Z954 Presence of other heart-valve replacement: Secondary | ICD-10-CM | POA: Diagnosis not present

## 2016-05-13 DIAGNOSIS — Z951 Presence of aortocoronary bypass graft: Secondary | ICD-10-CM | POA: Diagnosis not present

## 2016-05-13 DIAGNOSIS — Z952 Presence of prosthetic heart valve: Secondary | ICD-10-CM

## 2016-05-13 LAB — POCT INR: INR: 2

## 2016-05-13 MED ORDER — OXYCODONE-ACETAMINOPHEN 10-325 MG PO TABS: 1.0000 | ORAL_TABLET | Freq: Four times a day (QID) | ORAL | Status: DC | PRN

## 2016-05-13 NOTE — Progress Notes (Signed)
   Subjective:    Patient ID: Janet Joyce, female    DOB: May 30, 1956, 60 y.o.   MRN: [id number]  HPI   Patient is here for INR check. She wanted to let Dr. Ileene Rubens know that her cough is better being on the prednisone. Patient states she is taking 4 mg daily except on tues and Thursday when she takes a half tablet. Patient requests refill on oxycodone. Review of Systems     Objective:   Physical Exam        Assessment & Plan:

## 2016-05-13 NOTE — Progress Notes (Signed)
Will you please let patiet know that her INR was below goal, i'd recommend that she take a full 4 mg tablet of Coumadin on a daily basis 7 days out of the week. Return in one week for another INR

## 2016-05-14 ENCOUNTER — Telehealth: Payer: Self-pay | Admitting: *Deleted

## 2016-05-14 NOTE — Telephone Encounter (Signed)
Left message on vm with results  

## 2016-05-14 NOTE — Progress Notes (Signed)
Pt notified of medication changes.

## 2016-05-14 NOTE — Telephone Encounter (Signed)
-----   Message from [place], [place] sent at 05/13/2016  5:35 PM EDT ----- Will you please let Janet Joyce know that her INR was below goal, i'd recommend that she take a full 4 mg tablet of Coumadin on a daily basis 7 days out of the week. Return in one week for another INR

## 2016-05-22 ENCOUNTER — Ambulatory Visit: Payer: Medicare Other

## 2016-05-25 ENCOUNTER — Ambulatory Visit (INDEPENDENT_AMBULATORY_CARE_PROVIDER_SITE_OTHER): Payer: Medicare Other | Admitting: Sports Medicine

## 2016-05-25 ENCOUNTER — Other Ambulatory Visit: Payer: Self-pay | Admitting: Family Medicine

## 2016-05-25 VITALS — BP 122/78 | HR 78 | Wt 188.0 lb

## 2016-05-25 DIAGNOSIS — Z951 Presence of aortocoronary bypass graft: Secondary | ICD-10-CM | POA: Diagnosis not present

## 2016-05-25 DIAGNOSIS — Z954 Presence of other heart-valve replacement: Secondary | ICD-10-CM | POA: Diagnosis not present

## 2016-05-25 DIAGNOSIS — I4891 Unspecified atrial fibrillation: Secondary | ICD-10-CM

## 2016-05-25 DIAGNOSIS — Z952 Presence of prosthetic heart valve: Secondary | ICD-10-CM

## 2016-05-25 LAB — POCT INR: INR: 3.5

## 2016-06-10 ENCOUNTER — Ambulatory Visit (INDEPENDENT_AMBULATORY_CARE_PROVIDER_SITE_OTHER): Payer: Medicare Other | Admitting: Family Medicine

## 2016-06-10 ENCOUNTER — Other Ambulatory Visit: Payer: Self-pay | Admitting: *Deleted

## 2016-06-10 ENCOUNTER — Telehealth: Payer: Self-pay | Admitting: *Deleted

## 2016-06-10 VITALS — BP 122/64 | Wt 187.0 lb

## 2016-06-10 DIAGNOSIS — Z951 Presence of aortocoronary bypass graft: Secondary | ICD-10-CM

## 2016-06-10 DIAGNOSIS — Z952 Presence of prosthetic heart valve: Secondary | ICD-10-CM

## 2016-06-10 DIAGNOSIS — Z954 Presence of other heart-valve replacement: Secondary | ICD-10-CM

## 2016-06-10 LAB — POCT INR: INR: 4.5

## 2016-06-10 MED ORDER — OXYCODONE-ACETAMINOPHEN 10-325 MG PO TABS: 1.0000 | ORAL_TABLET | Freq: Four times a day (QID) | ORAL | Status: DC | PRN

## 2016-06-10 NOTE — Telephone Encounter (Signed)
Patient notified of recommendations and voiced understanding.

## 2016-06-10 NOTE — Progress Notes (Signed)
Janet Joyce can you please ask her to take half a 4mg  coumadin tablet on M/W/F and a full tablet all other days and return to recheck inr in about a week.

## 2016-06-10 NOTE — Progress Notes (Signed)
   Subjective:    Patient ID: Janet Joyce, female    DOB: 1956-07-10, 60 y.o.   MRN: [id number]  HPI   Patient is here for coumadin check. Also gave patient a prescription for hydrocodone. Ok to that it was given to her a few days early per Dr. Ileene Rubens. Patient denies any changes in her medication regimen or diet.  Review of Systems     Objective:   Physical Exam        Assessment & Plan:

## 2016-06-10 NOTE — Telephone Encounter (Signed)
-----   Message from [place], [place] sent at 06/10/2016  1:32 PM EDT ----- Seth Bake can you please ask her to take half a 4mg  coumadin tablet on M/W/F and a full tablet all other days and return to recheck inr in about a week.

## 2016-06-11 DIAGNOSIS — F322 Major depressive disorder, single episode, severe without psychotic features: Secondary | ICD-10-CM | POA: Diagnosis not present

## 2016-06-15 ENCOUNTER — Other Ambulatory Visit: Payer: Self-pay | Admitting: Family Medicine

## 2016-06-19 ENCOUNTER — Ambulatory Visit (INDEPENDENT_AMBULATORY_CARE_PROVIDER_SITE_OTHER): Payer: Medicare Other | Admitting: Family Medicine

## 2016-06-19 ENCOUNTER — Telehealth: Payer: Self-pay

## 2016-06-19 VITALS — BP 120/75 | Wt 185.0 lb

## 2016-06-19 DIAGNOSIS — I482 Chronic atrial fibrillation, unspecified: Secondary | ICD-10-CM

## 2016-06-19 DIAGNOSIS — Z951 Presence of aortocoronary bypass graft: Secondary | ICD-10-CM

## 2016-06-19 DIAGNOSIS — Z952 Presence of prosthetic heart valve: Secondary | ICD-10-CM

## 2016-06-19 DIAGNOSIS — Z954 Presence of other heart-valve replacement: Secondary | ICD-10-CM | POA: Diagnosis not present

## 2016-06-19 LAB — POCT INR: INR: 2.5

## 2016-06-19 NOTE — Progress Notes (Signed)
Pt presents to the clinic for a coumadin check. Denies changes in diet, changes in medications, cuts, and missed doses. -EMH/RMA

## 2016-06-19 NOTE — Telephone Encounter (Signed)
Pt is asking could she get a Rx for furosemide sent to her pharmacy due to swelling in her feet. Please advise.

## 2016-06-19 NOTE — Progress Notes (Signed)
Pt.notified

## 2016-06-19 NOTE — Patient Instructions (Signed)
Pt advised our clinic staff will call with recommendations. -EMH/RMA

## 2016-06-22 MED ORDER — FUROSEMIDE 20 MG PO TABS
20.0000 mg | ORAL_TABLET | Freq: Every day | ORAL | Status: DC | PRN
Start: 2016-06-22 — End: 2016-07-10

## 2016-06-22 NOTE — Telephone Encounter (Signed)
Left message on VM that a script was sent.

## 2016-06-22 NOTE — Telephone Encounter (Signed)
Please let patient know a Rx was sent to [facility] in [place]

## 2016-07-10 ENCOUNTER — Ambulatory Visit (INDEPENDENT_AMBULATORY_CARE_PROVIDER_SITE_OTHER): Payer: Medicare Other | Admitting: Family Medicine

## 2016-07-10 ENCOUNTER — Encounter: Payer: Self-pay | Admitting: Family Medicine

## 2016-07-10 ENCOUNTER — Ambulatory Visit (INDEPENDENT_AMBULATORY_CARE_PROVIDER_SITE_OTHER): Payer: Medicare Other

## 2016-07-10 VITALS — BP 121/64 | Wt 187.0 lb

## 2016-07-10 DIAGNOSIS — R079 Chest pain, unspecified: Secondary | ICD-10-CM | POA: Diagnosis not present

## 2016-07-10 DIAGNOSIS — Z951 Presence of aortocoronary bypass graft: Secondary | ICD-10-CM

## 2016-07-10 DIAGNOSIS — R05 Cough: Secondary | ICD-10-CM | POA: Diagnosis not present

## 2016-07-10 DIAGNOSIS — I482 Chronic atrial fibrillation, unspecified: Secondary | ICD-10-CM

## 2016-07-10 DIAGNOSIS — R600 Localized edema: Secondary | ICD-10-CM

## 2016-07-10 DIAGNOSIS — R053 Chronic cough: Secondary | ICD-10-CM

## 2016-07-10 DIAGNOSIS — J439 Emphysema, unspecified: Secondary | ICD-10-CM | POA: Diagnosis not present

## 2016-07-10 LAB — POCT INR: INR: 4.5

## 2016-07-10 MED ORDER — HYDROCODONE-HOMATROPINE 5-1.5 MG/5ML PO SYRP: 5.0000 mL | ORAL_SOLUTION | Freq: Three times a day (TID) | ORAL | 0 refills | Status: DC | PRN

## 2016-07-10 MED ORDER — OXYCODONE-ACETAMINOPHEN 10-325 MG PO TABS: 1.0000 | ORAL_TABLET | Freq: Four times a day (QID) | ORAL | 0 refills | Status: DC | PRN

## 2016-07-10 MED ORDER — FUROSEMIDE 20 MG PO TABS: 20.0000 mg | ORAL_TABLET | Freq: Two times a day (BID) | ORAL | 2 refills | Status: DC | PRN

## 2016-07-10 MED ORDER — BUDESONIDE-FORMOTEROL FUMARATE 80-4.5 MCG/ACT IN AERO: 2.0000 | INHALATION_SPRAY | Freq: Two times a day (BID) | RESPIRATORY_TRACT | 0 refills | Status: DC

## 2016-07-10 NOTE — Progress Notes (Signed)
CC: Janet Joyce is a 60 y.o. female is here for Cough; Shortness of Breath; and Chest Pain   Subjective: HPI:  Follow-up atrial fibrillation: She is currently taking 4 mg of Coumadin on Sunday, Tuesday, Thursday, Saturday and 2 mg on all other days. She denies any bleeding or bruising abnormality.  Follow pulmonary emphysema: She continues to have a cough that she's had for the past 2 months now. It's productive with occasional wheezing and shortness of breath with exertion. She denies exertional chest pain. She denies any blood in her sputum. She tells me Hycodan is the only thing that's helping her sleep due to cough. She denies fevers, chills, nor any skin complaints.  She's also had worsening lower extremity edema ever since being at the beach. She denies any orthopnea or chest pain. Symptoms are absent first in the morning and worse in the evening. Only taking 20 mg of Lasix daily.   Review Of Systems Outlined In HPI  Past Medical History:  Diagnosis Date  . CHF (congestive heart failure) ([place])   . Depression   . Heart attack ([place])   . Hyperlipidemia     No past surgical history on file. Family History  Problem Relation Age of Onset  . Diabetes Father     Social History   Social History  . Marital status: Divorced    Spouse name: N/A  . Number of children: N/A  . Years of education: N/A   Occupational History  . Not on file.   Social History Main Topics  . Smoking status: Current Every Day Smoker    Packs/day: 0.50    Types: Cigarettes  . Smokeless tobacco: Not on file  . Alcohol use No  . Drug use: No  . Sexual activity: No   Other Topics Concern  . Not on file   Social History Narrative  . No narrative on file     Objective: BP 121/64   Wt 187 lb (84.8 kg)   BMI 33.13 kg/m   General: Alert and Oriented, No Acute Distress HEENT: Pupils equal, round, reactive to light. Conjunctivae clear. Moist membranes Lungs: Comfortable work of breathing with  end expiratory wheezing in most lung fields. No rhonchi or rales Cardiac: Regular rate and rhythm. Normal S1, mechanical S2.  No murmurs, rubs, nor gallops.   Abdomen: Normal bowel sounds, soft and non tender without palpable masses. Extremities: No peripheral edema.  Strong peripheral pulses.  Mental Status: No depression, anxiety, nor agitation. Skin: Warm and dry.  Assessment & Plan: Janet Joyce was seen today for cough, shortness of breath and chest pain.  Diagnoses and all orders for this visit:  Chronic atrial fibrillation (HCC) -     COMPLETE METABOLIC PANEL WITH GFR -     furosemide (LASIX) 20 MG tablet; Take 1-2 tablets (20-40 mg total) by mouth 2 (two) times daily as needed for edema. -     POCT INR  Pulmonary emphysema, unspecified emphysema type (HCC) -     POCT INR  Chronic cough -     DG Chest 2 View -     HYDROcodone-homatropine (HYCODAN) 5-1.5 MG/5ML syrup; Take 5 mLs by mouth every 8 (eight) hours as needed for cough.  Bilateral edema of lower extremity  Other orders -     oxyCODONE-acetaminophen (PERCOCET) 10-325 MG tablet; Take 1-2 tablets by mouth every 6 (six) hours as needed for pain. -     budesonide-formoterol (SYMBICORT) 80-4.5 MCG/ACT inhaler; Inhale 2 puffs into the lungs 2 (  two) times daily.   Atrial fibrillation with an INR of 4.5 today, reducing Coumadin to 4 mg on Monday Wednesday Friday and 2 mg all other days. Repeat INR one week. Chronic cough: Most likely due to uncontrolled emphysema therefore start sample of Symbicort, colon Monday if beneficial so I can give a formal perception and if no better will call in an antibiotic. Obtain an x-ray today. Large edema: Increasing furosemide, checking renal function.  Return for 1 week INR check.   Discussed with this patient that I will be resigning from my position here with [facility] in September in order to stay with my family who will be moving to [facility]. I let him know about the  providers that are still accepting patients and I feel that this individual will be under great care if he/she stays here with [facility].

## 2016-07-11 ENCOUNTER — Other Ambulatory Visit: Payer: Self-pay | Admitting: Family Medicine

## 2016-07-11 LAB — COMPLETE METABOLIC PANEL WITH GFR
ALBUMIN: 3.9 g/dL (ref 3.6–5.1)
ALT: 12 U/L (ref 6–29)
AST: 17 U/L (ref 10–35)
Alkaline Phosphatase: 86 U/L (ref 33–130)
BILIRUBIN TOTAL: 0.4 mg/dL (ref 0.2–1.2)
BUN: 8 mg/dL (ref 7–25)
CALCIUM: 9.1 mg/dL (ref 8.6–10.4)
CO2: 26 mmol/L (ref 20–31)
CREATININE: 0.74 mg/dL (ref 0.50–1.05)
Chloride: 107 mmol/L (ref 98–110)
GFR, Est African American: >89 mL/min (ref >=60)
GFR, Est Non African American: >89 mL/min (ref >=60)
GLUCOSE: 109 mg/dL — AB (ref 65–99)
POTASSIUM: 4.4 mmol/L (ref 3.5–5.3)
SODIUM: 138 mmol/L (ref 135–146)
TOTAL PROTEIN: 6.4 g/dL (ref 6.1–8.1)

## 2016-07-13 ENCOUNTER — Telehealth: Payer: Self-pay

## 2016-07-13 MED ORDER — DOXYCYCLINE HYCLATE 100 MG PO TABS: 100.0000 mg | ORAL_TABLET | Freq: Two times a day (BID) | ORAL | 0 refills | Status: DC

## 2016-07-13 NOTE — Telephone Encounter (Signed)
Did you mean to send to Dr. Ileene Rubens?

## 2016-07-13 NOTE — Telephone Encounter (Signed)
I sent this to [facility] instead of you. Sorry please advise.

## 2016-07-13 NOTE — Telephone Encounter (Signed)
Pt.notified

## 2016-07-13 NOTE — Telephone Encounter (Signed)
Correct, I've sent a Rx of doxycycline to her [facility].

## 2016-07-17 ENCOUNTER — Ambulatory Visit (INDEPENDENT_AMBULATORY_CARE_PROVIDER_SITE_OTHER): Payer: Medicare Other | Admitting: Physician Assistant

## 2016-07-17 ENCOUNTER — Ambulatory Visit: Payer: Medicare Other

## 2016-07-17 ENCOUNTER — Encounter: Payer: Self-pay | Admitting: Physician Assistant

## 2016-07-17 VITALS — BP 92/60 | HR 64 | Ht 63.0 in | Wt 185.0 lb

## 2016-07-17 DIAGNOSIS — I482 Chronic atrial fibrillation, unspecified: Secondary | ICD-10-CM

## 2016-07-17 DIAGNOSIS — R21 Rash and other nonspecific skin eruption: Secondary | ICD-10-CM

## 2016-07-17 DIAGNOSIS — Z954 Presence of other heart-valve replacement: Secondary | ICD-10-CM | POA: Diagnosis not present

## 2016-07-17 DIAGNOSIS — Z952 Presence of prosthetic heart valve: Secondary | ICD-10-CM

## 2016-07-17 LAB — POCT INR: INR: 2.4

## 2016-07-17 NOTE — Progress Notes (Signed)
   Subjective:    Patient ID: Janet Joyce, female    DOB: 1956-11-12, 60 y.o.   MRN: [id number]  HPI Patient is a 60 year old female who presents to the clinic for her INR rechecked and to have a rash looked at on her left arm. She is concerned because her family went to [facility] and her son got in the ocean and got a bacteria of his arm. He had to be put on antibiotics. She only put her feet in the water. About 2 days ago she noticed 3 little dots on her left forearm. They are itchy. She denies any pain. She denies any fever, chills, nausea or vomiting. She has not tried anything to make better.   Review of Systems    see HPI.  Objective:   Physical Exam  Constitutional: She is oriented to person, place, and time. She appears well-developed and well-nourished.  HENT:  Head: Normocephalic and atraumatic.  Cardiovascular: Normal rate, regular rhythm and normal heart sounds.   Neurological: She is alert and oriented to person, place, and time.  Skin:  3 pinpoint scab papules on left forearm.   Psychiatric: She has a normal mood and affect. Her behavior is normal.          Assessment & Plan:  Rash- seems to be a but bite. Reassured not bacterial infection. Encouraged to use lotrisone cream that she already has with some topical bactroban. Follow up if worsening or not improving.   Chronic atrial fibrillation/H/O aortic valve replacement- INR today is 2.3. Stay on same dose 4mg  every day except Monday, Wednesday, Friday 2mg . Recheck in 4 weeks.

## 2016-07-21 DIAGNOSIS — Z952 Presence of prosthetic heart valve: Secondary | ICD-10-CM | POA: Diagnosis not present

## 2016-07-21 DIAGNOSIS — Z955 Presence of coronary angioplasty implant and graft: Secondary | ICD-10-CM | POA: Diagnosis not present

## 2016-07-21 DIAGNOSIS — I2510 Atherosclerotic heart disease of native coronary artery without angina pectoris: Secondary | ICD-10-CM | POA: Diagnosis not present

## 2016-07-21 DIAGNOSIS — Z951 Presence of aortocoronary bypass graft: Secondary | ICD-10-CM | POA: Diagnosis not present

## 2016-07-21 DIAGNOSIS — E785 Hyperlipidemia, unspecified: Secondary | ICD-10-CM | POA: Diagnosis not present

## 2016-07-30 ENCOUNTER — Telehealth: Payer: Self-pay | Admitting: Physician Assistant

## 2016-07-30 NOTE — Telephone Encounter (Signed)
Pt notified of recommendations

## 2016-07-30 NOTE — Telephone Encounter (Signed)
Patient called advised that one of her meds is causing her hair to fall out and she is concerned. Patient wanted Luvenia Starch to know and see if she can do some research to see what med it could be from what she is taking. Thanks

## 2016-07-30 NOTE — Telephone Encounter (Signed)
Call pt: a few medications that you are on could cause some hair loss coumadin and zoloft are most common. You can also have age related hormonal hair loss. Biotin are good vitamins that help hair growth. I do not recommend stopping either medication above.

## 2016-08-04 ENCOUNTER — Other Ambulatory Visit: Payer: Self-pay | Admitting: Family Medicine

## 2016-08-06 DIAGNOSIS — Z1231 Encounter for screening mammogram for malignant neoplasm of breast: Secondary | ICD-10-CM | POA: Diagnosis not present

## 2016-08-06 LAB — HM MAMMOGRAPHY

## 2016-08-12 ENCOUNTER — Ambulatory Visit (INDEPENDENT_AMBULATORY_CARE_PROVIDER_SITE_OTHER): Payer: Medicare Other | Admitting: Physician Assistant

## 2016-08-12 ENCOUNTER — Encounter: Payer: Self-pay | Admitting: Physician Assistant

## 2016-08-12 VITALS — BP 147/67 | HR 74 | Ht 63.0 in | Wt 186.0 lb

## 2016-08-12 DIAGNOSIS — I482 Chronic atrial fibrillation, unspecified: Secondary | ICD-10-CM

## 2016-08-12 DIAGNOSIS — Z954 Presence of other heart-valve replacement: Secondary | ICD-10-CM | POA: Diagnosis not present

## 2016-08-12 DIAGNOSIS — E785 Hyperlipidemia, unspecified: Secondary | ICD-10-CM

## 2016-08-12 DIAGNOSIS — S42291D Other displaced fracture of upper end of right humerus, subsequent encounter for fracture with routine healing: Secondary | ICD-10-CM

## 2016-08-12 DIAGNOSIS — Z79899 Other long term (current) drug therapy: Secondary | ICD-10-CM | POA: Diagnosis not present

## 2016-08-12 DIAGNOSIS — R7303 Prediabetes: Secondary | ICD-10-CM | POA: Diagnosis not present

## 2016-08-12 DIAGNOSIS — M549 Dorsalgia, unspecified: Secondary | ICD-10-CM

## 2016-08-12 DIAGNOSIS — Z952 Presence of prosthetic heart valve: Secondary | ICD-10-CM

## 2016-08-12 DIAGNOSIS — G8929 Other chronic pain: Secondary | ICD-10-CM

## 2016-08-12 LAB — POCT INR: INR: 2.2

## 2016-08-12 MED ORDER — WARFARIN SODIUM 4 MG PO TABS: ORAL_TABLET | ORAL | 1 refills | Status: DC

## 2016-08-12 MED ORDER — BUDESONIDE-FORMOTEROL FUMARATE 80-4.5 MCG/ACT IN AERO: 2.0000 | INHALATION_SPRAY | Freq: Two times a day (BID) | RESPIRATORY_TRACT | 5 refills | Status: DC

## 2016-08-12 MED ORDER — OXYCODONE-ACETAMINOPHEN 10-325 MG PO TABS: 1.0000 | ORAL_TABLET | Freq: Four times a day (QID) | ORAL | 0 refills | Status: DC | PRN

## 2016-08-12 MED ORDER — ISOSORBIDE MONONITRATE ER 30 MG PO TB24: 30.0000 mg | ORAL_TABLET | Freq: Every day | ORAL | 5 refills | Status: AC

## 2016-08-12 MED ORDER — CARVEDILOL 3.125 MG PO TABS: 3.1250 mg | ORAL_TABLET | Freq: Two times a day (BID) | ORAL | 5 refills | Status: AC

## 2016-08-12 MED ORDER — POTASSIUM CHLORIDE CRYS ER 20 MEQ PO TBCR: 40.0000 meq | EXTENDED_RELEASE_TABLET | Freq: Every day | ORAL | 5 refills | Status: DC

## 2016-08-12 NOTE — Progress Notes (Signed)
   Subjective:    Patient ID: Janet Joyce, female    DOB: 1956/04/22, 60 y.o.   MRN: [id number]  HPI Patient is a 60 year old female who presents to the clinic to reestablish care with myself. Her previous provider Dr. Barbaraann Barthel is leaving the practice.  History of Humeral head fracture that has healed and chronic back pain-patient is on daily Percocet for pain. She gets these filled monthly. She is not under a pain contract.  Patient has chronic atrial fibrillation with a aortic valve replacement-patient is on Coumadin and she is here for an INR recheck today. Pt is on 4mg  on Sunday, Monday, Friday, Saturday.  2mg  on Tuesday, wednes, Thursday.   Patient does wonder what labs she might need.   Review of Systems  All other systems reviewed and are negative.      Objective:   Physical Exam  Constitutional: She is oriented to person, place, and time. She appears well-developed and well-nourished.  HENT:  Head: Normocephalic and atraumatic.  Cardiovascular: Normal rate, regular rhythm and normal heart sounds.   Pulmonary/Chest: Effort normal and breath sounds normal.  Neurological: She is alert and oriented to person, place, and time.  Psychiatric: She has a normal mood and affect. Her behavior is normal.          Assessment & Plan:  Chronic atrial fibrillation/history of aortic valve replacement-INR was 2.2 today. This is in normal range.continue same dose and recheck in 2 weeks with nurse visit.   Humeral head fracture/chronic back pain- discuss with patient signing pain contract. Contract read and signed today.  She is okay with this. UDS ordered today. Percocet refilled today for one month.   Pre-diabetes- A!C ordered.   Hyperlipidemia lipid ordered. Will refill medication accordingly.

## 2016-08-13 DIAGNOSIS — R7303 Prediabetes: Secondary | ICD-10-CM | POA: Diagnosis not present

## 2016-08-13 DIAGNOSIS — E785 Hyperlipidemia, unspecified: Secondary | ICD-10-CM | POA: Diagnosis not present

## 2016-08-13 LAB — LIPID PANEL
Cholesterol: 141 mg/dL (ref 125–200)
HDL: 34 mg/dL — AB (ref >=46)
LDL CALC: 61 mg/dL (ref <130)
Total CHOL/HDL Ratio: 4.1 Ratio (ref <=5.0)
Triglycerides: 228 mg/dL — ABNORMAL HIGH (ref <150)
VLDL: 46 mg/dL — AB (ref <30)

## 2016-08-14 LAB — HEMOGLOBIN A1C
HEMOGLOBIN A1C: 6.1 % — AB (ref <5.7)
Mean Plasma Glucose: 128 mg/dL

## 2016-08-18 LAB — PAIN MGMT, PROFILE 6 CONF W/O MM, U
6 ACETYLMORPHINE: NEGATIVE ng/mL (ref <10)
ALCOHOL METABOLITES: NEGATIVE ng/mL (ref <500)
ALPHAHYDROXYMIDAZOLAM: NEGATIVE ng/mL (ref <50)
ALPHAHYDROXYTRIAZOLAM: NEGATIVE ng/mL (ref <50)
AMINOCLONAZEPAM: 690 ng/mL — AB (ref <25)
AMPHETAMINES: NEGATIVE ng/mL (ref <500)
Alphahydroxyalprazolam: NEGATIVE ng/mL (ref <25)
BARBITURATES: NEGATIVE ng/mL (ref <300)
Benzodiazepines: POSITIVE ng/mL — AB (ref <100)
COCAINE METABOLITE: NEGATIVE ng/mL (ref <150)
CODEINE: NEGATIVE ng/mL (ref <50)
Creatinine: 88 mg/dL (ref >=20.0)
HYDROCODONE: 708 ng/mL — AB (ref <50)
HYDROXYETHYLFLURAZEPAM: NEGATIVE ng/mL (ref <50)
Hydromorphone: 83 ng/mL — ABNORMAL HIGH (ref <50)
Lorazepam: NEGATIVE ng/mL (ref <50)
METHADONE METABOLITE: NEGATIVE ng/mL (ref <100)
Marijuana Metabolite: NEGATIVE ng/mL (ref <20)
Morphine: NEGATIVE ng/mL (ref <50)
Nordiazepam: NEGATIVE ng/mL (ref <50)
Norhydrocodone: 1269 ng/mL — ABNORMAL HIGH (ref <50)
OPIATES: POSITIVE ng/mL — AB (ref <100)
OXYCODONE: NEGATIVE ng/mL (ref <100)
Oxazepam: 1948 ng/mL — ABNORMAL HIGH (ref <50)
Oxidant: NEGATIVE ug/mL (ref <200)
PLEASE NOTE: 0
Phencyclidine: NEGATIVE ng/mL (ref <25)
Temazepam: 16921 ng/mL — ABNORMAL HIGH (ref <50)
pH: 6.21 (ref 4.5–9.0)

## 2016-08-21 ENCOUNTER — Encounter: Payer: Self-pay | Admitting: Physician Assistant

## 2016-08-28 ENCOUNTER — Ambulatory Visit (INDEPENDENT_AMBULATORY_CARE_PROVIDER_SITE_OTHER): Payer: Medicare Other | Admitting: Physician Assistant

## 2016-08-28 DIAGNOSIS — Z952 Presence of prosthetic heart valve: Secondary | ICD-10-CM

## 2016-08-28 DIAGNOSIS — Z951 Presence of aortocoronary bypass graft: Secondary | ICD-10-CM

## 2016-08-28 DIAGNOSIS — I482 Chronic atrial fibrillation, unspecified: Secondary | ICD-10-CM

## 2016-08-28 LAB — POCT INR: INR: 3

## 2016-08-28 NOTE — Progress Notes (Signed)
Patient was in the office for INR check. Patient denied anything abnormal. Janet Joyce

## 2016-09-05 ENCOUNTER — Other Ambulatory Visit: Payer: Self-pay | Admitting: Family Medicine

## 2016-09-09 ENCOUNTER — Encounter: Payer: Self-pay | Admitting: Physician Assistant

## 2016-09-09 ENCOUNTER — Ambulatory Visit (INDEPENDENT_AMBULATORY_CARE_PROVIDER_SITE_OTHER): Payer: Medicare Other | Admitting: Physician Assistant

## 2016-09-09 VITALS — BP 154/58 | HR 82 | Ht 63.0 in | Wt 186.0 lb

## 2016-09-09 DIAGNOSIS — J441 Chronic obstructive pulmonary disease with (acute) exacerbation: Secondary | ICD-10-CM | POA: Diagnosis not present

## 2016-09-09 DIAGNOSIS — J42 Unspecified chronic bronchitis: Secondary | ICD-10-CM

## 2016-09-09 DIAGNOSIS — I482 Chronic atrial fibrillation, unspecified: Secondary | ICD-10-CM

## 2016-09-09 DIAGNOSIS — K2100 Gastro-esophageal reflux disease with esophagitis, without bleeding: Secondary | ICD-10-CM

## 2016-09-09 LAB — POCT INR: INR: 5

## 2016-09-09 LAB — PROTIME-INR
INR: 5.1 — AB
PROTHROMBIN TIME: 50.2 s — AB (ref 9.0–11.5)

## 2016-09-09 MED ORDER — MONTELUKAST SODIUM 10 MG PO TABS: 10.0000 mg | ORAL_TABLET | Freq: Every day | ORAL | 5 refills | Status: DC

## 2016-09-09 MED ORDER — PANTOPRAZOLE SODIUM 40 MG PO TBEC: 40.0000 mg | DELAYED_RELEASE_TABLET | Freq: Every day | ORAL | 3 refills | Status: DC

## 2016-09-09 MED ORDER — METHYLPREDNISOLONE SODIUM SUCC 125 MG IJ SOLR
125.0000 mg | Freq: Once | INTRAMUSCULAR | Status: AC
  Administered 2016-09-09: 125 mg via INTRAMUSCULAR

## 2016-09-09 MED ORDER — OXYCODONE-ACETAMINOPHEN 10-325 MG PO TABS: 1.0000 | ORAL_TABLET | Freq: Four times a day (QID) | ORAL | 0 refills | Status: DC | PRN

## 2016-09-09 NOTE — Progress Notes (Addendum)
Subjective:     Patient ID: Janet Joyce, female   DOB: Mar 02, 1956, 60 y.o.   MRN: [id number]  HPI Patient is a 60 y.o. Caucasian female presenting today to get her INR checked for her Coumadin and with complaints of a chronic cough. The patient notes that she has had this cough for the past 6 months to 1 year. The patient states that it can be productive at times but is often dry. The patient notes that it is constant every day numerous times daily. The patient reports that the frequency of the cough makes her chest hurt with some shortness of breath. She states that she was previously treated by Dr. Ileene Rubens and had an chest x-ray. The patient notes that she has been taking her Symbicort once daily but not her duoneb treatment. The patient reports that she has some acid reflux and often lays down after eating. The patient denies appetite change, weight loss, fever, or chills.  Review of Systems  Constitutional: Negative for activity change, appetite change, chills, diaphoresis, fatigue, fever and unexpected weight change.  HENT: Positive for congestion and sinus pressure. Negative for ear discharge, ear pain, postnasal drip, rhinorrhea, sneezing and sore throat.   Eyes: Negative.   Respiratory: Positive for cough, chest tightness, shortness of breath and wheezing.   Cardiovascular: Negative for chest pain, palpitations and leg swelling.  Gastrointestinal: Negative.   Endocrine: Negative.   Genitourinary: Negative.   Musculoskeletal: Negative.   Skin: Negative.   Allergic/Immunologic: Negative.   Neurological: Negative for dizziness, syncope, weakness, light-headedness and headaches.  Hematological: Negative.   Psychiatric/Behavioral: Negative.       Objective:   Physical Exam  Constitutional: She is oriented to person, place, and time. She appears well-developed and well-nourished. No distress.  HENT:  Head: Normocephalic and atraumatic.  Right Ear: External ear normal.  Left Ear:  External ear normal.  Nose: Nose normal.  Mouth/Throat: Oropharynx is clear and moist. No oropharyngeal exudate.  Eyes: Conjunctivae and EOM are normal. Pupils are equal, round, and reactive to light. Right eye exhibits no discharge. Left eye exhibits no discharge. No scleral icterus.  Neck: Normal range of motion. Neck supple. No JVD present. No tracheal deviation present. No thyromegaly present.  Cardiovascular: Normal rate, normal heart sounds and intact distal pulses.  Exam reveals no gallop and no friction rub.   No murmur heard. Pulmonary/Chest: Effort normal. No stridor. No respiratory distress. She has wheezes. She has no rales. She exhibits no tenderness.  Abdominal: Soft. Bowel sounds are normal. She exhibits no distension and no mass. There is no tenderness. There is no rebound and no guarding.  Lymphadenopathy:    She has no cervical adenopathy.  Neurological: She is alert and oriented to person, place, and time. No cranial nerve deficit. Coordination normal.  Skin: Skin is warm and dry. No rash noted. She is not diaphoretic. No erythema. No pallor.  Psychiatric: She has a normal mood and affect. Her behavior is normal. Judgment and thought content normal.      Assessment:     Janet Joyce was seen today for cough.  Diagnoses and all orders for this visit:  Chronic bronchitis, unspecified chronic bronchitis type ([place]) -     montelukast (SINGULAIR) 10 MG tablet; Take 1 tablet (10 mg total) by mouth at bedtime. -     oxyCODONE-acetaminophen (PERCOCET) 10-325 MG tablet; Take 1-2 tablets by mouth every 6 (six) hours as needed for pain. -     methylPREDNISolone sodium succinate (SOLU-MEDROL) 125  mg/2 mL injection 125 mg; Inject 2 mLs (125 mg total) into the muscle once.  Chronic atrial fibrillation (HCC) -     POCT INR -     INR/PT  Gastroesophageal reflux disease with esophagitis -     pantoprazole (PROTONIX) 40 MG tablet; Take 1 tablet (40 mg total) by mouth daily.  COPD  exacerbation (HCC) -     montelukast (SINGULAIR) 10 MG tablet; Take 1 tablet (10 mg total) by mouth at bedtime. -     oxyCODONE-acetaminophen (PERCOCET) 10-325 MG tablet; Take 1-2 tablets by mouth every 6 (six) hours as needed for pain.      Plan:     1. Chronic Bronchitis/COPD exacerbation - Discussed with patient that her chronic cough is most likely secondary to her COPD not being well controlled. Patient to continue with Symbicort inhaler 2 puffs twice daily and instructed to use her Duoneb 4 times today and cut back to 2 times tomorrow. The patient was started on Singulair 10mg  1 tablet at bedtime and was given a solu-medrol injection in-clinic today.  2. GERD - Discussed with patient that her chronic cough could be exacerbated by her GERD. Patient started on Protonix 40mg  tablets daily. Patient given hand-out on instructions to decrease GERD symptoms. Patient to return-to-clinic if symptoms do not improve or worsen.  3. Chronic afib - Patient with INR in-clinic outside normal value at an elevation of 5.0. Patient sent to [facility] in house for stat INR which came back 5.1. Pt instructed to hold coumadin today and then decrease to 2mg  M, T, W, Th, F and 4mg  on Sat and Sun. Follow up in 1 week.  Patient educated and given hand-out on numerous dietary substances and medications that interact with Coumadin. Will continue to monitor.  Summary - Patient to return-to-clinic if symptoms do not improve or worsen. Patient given refill of percocet.

## 2016-09-09 NOTE — Patient Instructions (Addendum)
Start singulair and use nebulizer daily.  If needed use abx.   Food Choices for Gastroesophageal Reflux Disease, Adult When you have gastroesophageal reflux disease (GERD), the foods you eat and your eating habits are very important. Choosing the right foods can help ease the discomfort of GERD. WHAT GENERAL GUIDELINES DO I NEED TO FOLLOW?  Choose fruits, vegetables, whole grains, low-fat dairy products, and low-fat meat, fish, and poultry.  Limit fats such as oils, salad dressings, butter, nuts, and avocado.  Keep a food diary to identify foods that cause symptoms.  Avoid foods that cause reflux. These may be different for different people.  Eat frequent small meals instead of three large meals each day.  Eat your meals slowly, in a relaxed setting.  Limit fried foods.  Cook foods using methods other than frying.  Avoid drinking alcohol.  Avoid drinking large amounts of liquids with your meals.  Avoid bending over or lying down until 2-3 hours after eating. WHAT FOODS ARE NOT RECOMMENDED? The following are some foods and drinks that may worsen your symptoms: Vegetables Tomatoes. Tomato juice. Tomato and spaghetti sauce. Chili peppers. Onion and garlic. Horseradish. Fruits Oranges, grapefruit, and lemon (fruit and juice). Meats High-fat meats, fish, and poultry. This includes hot dogs, ribs, ham, sausage, salami, and bacon. Dairy Whole milk and chocolate milk. Sour cream. Cream. Butter. Ice cream. Cream cheese.  Beverages Coffee and tea, with or without caffeine. Carbonated beverages or energy drinks. Condiments Hot sauce. Barbecue sauce.  Sweets/Desserts Chocolate and cocoa. Donuts. Peppermint and spearmint. Fats and Oils High-fat foods, including [place] fries and potato chips. Other Vinegar. Strong spices, such as black pepper, white pepper, red pepper, cayenne, curry powder, cloves, ginger, and chili powder. The items listed above may not be a complete list of foods  and beverages to avoid. Contact your dietitian for more information.   This information is not intended to replace advice given to you by your health care provider. Make sure you discuss any questions you have with your health care provider.   Document Released: 11/30/2005 Document Revised: 12/21/2014 Document Reviewed: 10/04/2013 Elsevier Interactive Patient Education [employer].

## 2016-09-10 DIAGNOSIS — F322 Major depressive disorder, single episode, severe without psychotic features: Secondary | ICD-10-CM | POA: Diagnosis not present

## 2016-09-11 ENCOUNTER — Telehealth: Payer: Self-pay | Admitting: Physician Assistant

## 2016-09-11 ENCOUNTER — Other Ambulatory Visit: Payer: Self-pay

## 2016-09-11 ENCOUNTER — Other Ambulatory Visit: Payer: Self-pay | Admitting: Physician Assistant

## 2016-09-11 NOTE — Telephone Encounter (Signed)
Hold Singulair for a couple fo days and do the protonix.  If feeling better in a couple days then can try restarting the Singulair. I'm not sure about the amitriptyline. I don't see it on her med list. That might have to wait for Jade on Monday.

## 2016-09-11 NOTE — Telephone Encounter (Signed)
Pt called clinic stating she has been vomiting since last night. Pt was seen in clinic yesterday and started on two new medications: Singulair and Protonix. Pt also was given a solu-medrol injection at her visit, states she has had these in the past and never gotten sick. Pt questions if she should continue these Rx's or make changes? Pt also request a refill on her amitriptyline Rx. Will route to a Provider in office today.

## 2016-09-11 NOTE — Telephone Encounter (Signed)
Pt advised, verbalized understanding. She will contact clinic Monday if she is not feeling better.

## 2016-09-14 ENCOUNTER — Telehealth: Payer: Self-pay

## 2016-09-14 NOTE — Telephone Encounter (Signed)
Stop both of them. unusual response to these medications. If you feel like you are not keeping fluids down. May need to come in for fluids.

## 2016-09-14 NOTE — Telephone Encounter (Signed)
Pt was seen last week and was Rx for pantoprazole and singulair.  She stated that since taking these medications she hasn't been able to keep anything down. Please advise.

## 2016-09-15 NOTE — Telephone Encounter (Signed)
Pt notified and stated that since she stopped those medication she has felt better

## 2016-09-18 ENCOUNTER — Encounter: Payer: Self-pay | Admitting: Physician Assistant

## 2016-09-18 ENCOUNTER — Ambulatory Visit (INDEPENDENT_AMBULATORY_CARE_PROVIDER_SITE_OTHER): Payer: Medicare Other | Admitting: Physician Assistant

## 2016-09-18 DIAGNOSIS — I482 Chronic atrial fibrillation, unspecified: Secondary | ICD-10-CM

## 2016-09-18 DIAGNOSIS — Z951 Presence of aortocoronary bypass graft: Secondary | ICD-10-CM

## 2016-09-18 DIAGNOSIS — Z952 Presence of prosthetic heart valve: Secondary | ICD-10-CM

## 2016-09-18 LAB — POCT INR: INR: 4.1

## 2016-09-18 MED ORDER — DOXYCYCLINE HYCLATE 100 MG PO TABS: 100.0000 mg | ORAL_TABLET | Freq: Two times a day (BID) | ORAL | 0 refills | Status: DC

## 2016-09-18 NOTE — Progress Notes (Signed)
2mg  every day. Recheck in 2 weeks. Doxycycline sent.

## 2016-09-18 NOTE — Progress Notes (Signed)
Pt.notified

## 2016-09-18 NOTE — Progress Notes (Signed)
Pt presents to the clinic for a coumadin check. Denies skipped does, medication changes, and changes in diet.  Pt confirmed that she was taking 4 mg of warfin on Sun, Sat, Tues, Thurs, and 2 mg all other days.  Pt was seen on 09/09/16 for congestion and stated hat she isn't any better and is requesting and antibiotic. Will route to PCP for further review. -EMH/RMA  I will send over doxycycline since taking symbicort and doing duoneb and still battling congestion. Will decrease coumadin to 2mg  every day. Recheck in 2 weeks. Iran Planas PA-C

## 2016-09-28 DIAGNOSIS — F17210 Nicotine dependence, cigarettes, uncomplicated: Secondary | ICD-10-CM | POA: Diagnosis not present

## 2016-09-28 DIAGNOSIS — H6981 Other specified disorders of Eustachian tube, right ear: Secondary | ICD-10-CM | POA: Diagnosis not present

## 2016-09-28 DIAGNOSIS — H6121 Impacted cerumen, right ear: Secondary | ICD-10-CM | POA: Diagnosis not present

## 2016-09-28 DIAGNOSIS — H6521 Chronic serous otitis media, right ear: Secondary | ICD-10-CM | POA: Diagnosis not present

## 2016-09-30 ENCOUNTER — Ambulatory Visit (INDEPENDENT_AMBULATORY_CARE_PROVIDER_SITE_OTHER): Payer: Medicare Other | Admitting: Osteopathic Medicine

## 2016-09-30 DIAGNOSIS — Z952 Presence of prosthetic heart valve: Secondary | ICD-10-CM | POA: Diagnosis not present

## 2016-09-30 DIAGNOSIS — I482 Chronic atrial fibrillation, unspecified: Secondary | ICD-10-CM

## 2016-09-30 DIAGNOSIS — Z951 Presence of aortocoronary bypass graft: Secondary | ICD-10-CM | POA: Diagnosis not present

## 2016-09-30 LAB — POCT INR: INR: >4.5

## 2016-09-30 LAB — PROTIME-INR
INR: 5.1 — ABNORMAL HIGH
Prothrombin Time: 49.8 s — ABNORMAL HIGH (ref 9.0–11.5)

## 2016-09-30 NOTE — Progress Notes (Signed)
Called patient and left message to call the clinic back. Hold Coumadin for at least today and tomorrow. Previous written instructions do not correspond to what the patient is reportedly taking which looks like 2 mg daily, 8 to verify exactly what she is taking so that we can adjust the dose. Patient should call us back ASAP.

## 2016-09-30 NOTE — Progress Notes (Signed)
INR of 5.6 today.  Pt sent down to lab for STAT pt/inr.

## 2016-10-01 NOTE — Progress Notes (Signed)
Pt returned clinic call. States she is taking 2mg  on M/W/F and 4mg  all other days. Pt has not taken coumadin today. Will route to Provider

## 2016-10-01 NOTE — Progress Notes (Signed)
I called the patient back and instructed her on new Coumadin regimen. She should be coming back next week for INR check. I wasn't sure how to schedule something on the nurse visit, can we please call the patient and have her come back next Wednesday or Thursday for INR recheck? Thank you

## 2016-10-02 NOTE — Progress Notes (Signed)
Pt will call and schedule

## 2016-10-07 ENCOUNTER — Ambulatory Visit (INDEPENDENT_AMBULATORY_CARE_PROVIDER_SITE_OTHER): Payer: Medicare Other | Admitting: Physician Assistant

## 2016-10-07 DIAGNOSIS — Z952 Presence of prosthetic heart valve: Secondary | ICD-10-CM | POA: Diagnosis not present

## 2016-10-07 DIAGNOSIS — Z951 Presence of aortocoronary bypass graft: Secondary | ICD-10-CM

## 2016-10-07 DIAGNOSIS — I482 Chronic atrial fibrillation, unspecified: Secondary | ICD-10-CM

## 2016-10-07 LAB — POCT INR
INR: 3.9
INR: 3.9

## 2016-10-07 NOTE — Progress Notes (Signed)
Pt came to clinic for a INR check.  She denies changes in mediation, diet, and skipped doses.  Pt reports taking 4 mg on Mondays, Wednesday, Friday, and 2 mg all other days. Pt INR was 3.9. Pt also requested some medication refills.  She would a refill of hydromet and ipratropium bromide and albuterol.  The inhaler she stated that she got it from our office as a sample when she was seeing Dr. Ileene Rubens.  We current do not have this particular inhaler we only have symbicort 80/4.5 and 160/4.5. Will route to PCP for further review. -EMH/RMA  See anticoagulation tracker for instructions. Iran Planas PA-C

## 2016-10-09 ENCOUNTER — Other Ambulatory Visit: Payer: Self-pay | Admitting: *Deleted

## 2016-10-09 DIAGNOSIS — J42 Unspecified chronic bronchitis: Secondary | ICD-10-CM

## 2016-10-09 DIAGNOSIS — J441 Chronic obstructive pulmonary disease with (acute) exacerbation: Secondary | ICD-10-CM

## 2016-10-09 MED ORDER — OXYCODONE-ACETAMINOPHEN 10-325 MG PO TABS: 1.0000 | ORAL_TABLET | Freq: Four times a day (QID) | ORAL | 0 refills | Status: DC | PRN

## 2016-10-14 DIAGNOSIS — E782 Mixed hyperlipidemia: Secondary | ICD-10-CM | POA: Diagnosis not present

## 2016-10-14 DIAGNOSIS — J449 Chronic obstructive pulmonary disease, unspecified: Secondary | ICD-10-CM | POA: Diagnosis not present

## 2016-10-14 DIAGNOSIS — I2510 Atherosclerotic heart disease of native coronary artery without angina pectoris: Secondary | ICD-10-CM | POA: Diagnosis not present

## 2016-10-14 DIAGNOSIS — Z955 Presence of coronary angioplasty implant and graft: Secondary | ICD-10-CM | POA: Diagnosis not present

## 2016-10-14 DIAGNOSIS — Z952 Presence of prosthetic heart valve: Secondary | ICD-10-CM | POA: Diagnosis not present

## 2016-10-14 DIAGNOSIS — Z951 Presence of aortocoronary bypass graft: Secondary | ICD-10-CM | POA: Diagnosis not present

## 2016-10-21 ENCOUNTER — Telehealth: Payer: Self-pay | Admitting: Physician Assistant

## 2016-10-21 ENCOUNTER — Other Ambulatory Visit: Payer: Self-pay | Admitting: *Deleted

## 2016-10-21 MED ORDER — CHLORDIAZEPOXIDE-AMITRIPTYLINE 5-12.5 MG PO TABS: ORAL_TABLET | ORAL | 0 refills | Status: DC

## 2016-10-21 NOTE — Telephone Encounter (Signed)
Patient called request to get a refill for Choloridaze Poxide-Amitriptyline she is completely out and adv the pharmacy has sent a request and had not gotten a response back. Thanks

## 2016-10-21 NOTE — Telephone Encounter (Signed)
Done

## 2016-10-28 ENCOUNTER — Ambulatory Visit (INDEPENDENT_AMBULATORY_CARE_PROVIDER_SITE_OTHER): Payer: Medicare Other | Admitting: Physician Assistant

## 2016-10-28 DIAGNOSIS — I482 Chronic atrial fibrillation, unspecified: Secondary | ICD-10-CM

## 2016-10-28 DIAGNOSIS — Z952 Presence of prosthetic heart valve: Secondary | ICD-10-CM | POA: Diagnosis not present

## 2016-10-28 DIAGNOSIS — Z951 Presence of aortocoronary bypass graft: Secondary | ICD-10-CM

## 2016-10-28 LAB — POCT INR: INR: 3.8

## 2016-10-28 NOTE — Progress Notes (Signed)
Pt is here for a INR check. Denies changes in diet, medication, and skipped doses. Pt reports that she was taking 4 mg on Monday, Wednesday, Friday, and 2 mg all other days. Will route to PCP for further review.

## 2016-10-28 NOTE — Progress Notes (Signed)
Pt.notified

## 2016-10-28 NOTE — Patient Instructions (Signed)
Decrease half-tablet days(2mg ) to every day except whole tablet (4mg ) on Monday and Friday. Recheck in 2 weeks.

## 2016-11-03 ENCOUNTER — Ambulatory Visit: Payer: Medicare Other | Admitting: Physician Assistant

## 2016-11-11 ENCOUNTER — Ambulatory Visit (INDEPENDENT_AMBULATORY_CARE_PROVIDER_SITE_OTHER): Payer: Medicare Other | Admitting: Physician Assistant

## 2016-11-11 ENCOUNTER — Encounter: Payer: Self-pay | Admitting: Physician Assistant

## 2016-11-11 VITALS — BP 156/67 | HR 75 | Ht 63.0 in | Wt 188.0 lb

## 2016-11-11 DIAGNOSIS — M549 Dorsalgia, unspecified: Secondary | ICD-10-CM

## 2016-11-11 DIAGNOSIS — I482 Chronic atrial fibrillation, unspecified: Secondary | ICD-10-CM

## 2016-11-11 DIAGNOSIS — G2581 Restless legs syndrome: Secondary | ICD-10-CM

## 2016-11-11 DIAGNOSIS — G8929 Other chronic pain: Secondary | ICD-10-CM

## 2016-11-11 DIAGNOSIS — M940 Chondrocostal junction syndrome [Tietze]: Secondary | ICD-10-CM

## 2016-11-11 DIAGNOSIS — J441 Chronic obstructive pulmonary disease with (acute) exacerbation: Secondary | ICD-10-CM

## 2016-11-11 DIAGNOSIS — J42 Unspecified chronic bronchitis: Secondary | ICD-10-CM | POA: Diagnosis not present

## 2016-11-11 DIAGNOSIS — Z952 Presence of prosthetic heart valve: Secondary | ICD-10-CM

## 2016-11-11 LAB — POCT INR: INR: 3.2

## 2016-11-11 MED ORDER — PREDNISONE 50 MG PO TABS: ORAL_TABLET | ORAL | 0 refills | Status: DC

## 2016-11-11 MED ORDER — OXYCODONE-ACETAMINOPHEN 10-325 MG PO TABS: 1.0000 | ORAL_TABLET | Freq: Four times a day (QID) | ORAL | 0 refills | Status: DC | PRN

## 2016-11-11 MED ORDER — BUDESONIDE-FORMOTEROL FUMARATE 80-4.5 MCG/ACT IN AERO: 2.0000 | INHALATION_SPRAY | Freq: Two times a day (BID) | RESPIRATORY_TRACT | 5 refills | Status: DC

## 2016-11-11 MED ORDER — GABAPENTIN 100 MG PO CAPS: ORAL_CAPSULE | ORAL | 1 refills | Status: DC

## 2016-11-11 NOTE — Patient Instructions (Addendum)
Same dose. Recheck in 2 weeks.    Costochondritis Costochondritis is swelling and irritation (inflammation) of the tissue (cartilage) that connects your ribs to your breastbone (sternum). This causes pain in the front of your chest. The pain usually starts gradually and involves more than one rib. What are the causes? The exact cause of this condition is not always known. It results from stress on the cartilage where your ribs attach to your sternum. The cause of this stress could be:  Chest injury (trauma).  Exercise or activity, such as lifting.  Severe coughing. What increases the risk? You may be at higher risk for this condition if you:  Are female.  Are 36?58 years old.  Recently started a new exercise or work activity.  Have low levels of vitamin D.  Have a condition that makes you cough frequently. What are the signs or symptoms? The main symptom of this condition is chest pain. The pain:  Usually starts gradually and can be sharp or dull.  Gets worse with deep breathing, coughing, or exercise.  Gets better with rest.  May be worse when you press on the sternum-rib connection (tenderness). How is this diagnosed? This condition is diagnosed based on your symptoms, medical history, and a physical exam. Your health care provider will check for tenderness when pressing on your sternum. This is the most important finding. You may also have tests to rule out other causes of chest pain. These may include:  A chest X-ray to check for lung problems.  An electrocardiogram (ECG) to see if you have a heart problem that could be causing the pain.  An imaging scan to rule out a chest or rib fracture. How is this treated? This condition usually goes away on its own over time. Your health care provider may prescribe an NSAID to reduce pain and inflammation. Your health care provider may also suggest that you:  Rest and avoid activities that make pain worse.  Apply heat or cold  to the area to reduce pain and inflammation.  Do exercises to stretch your chest muscles. If these treatments do not help, your health care provider may inject a numbing medicine at the sternum-rib connection to help relieve the pain. Follow these instructions at home:  Avoid activities that make pain worse. This includes any activities that use chest, abdominal, and side muscles.  If directed, put ice on the painful area:  Put ice in a plastic bag.  Place a towel between your skin and the bag.  Leave the ice on for 20 minutes, 2-3 times a day.  If directed, apply heat to the affected area as often as told by your health care provider. Use the heat source that your health care provider recommends, such as a moist heat pack or a heating pad.  Place a towel between your skin and the heat source.  Leave the heat on for 20-30 minutes.  Remove the heat if your skin turns bright red. This is especially important if you are unable to feel pain, heat, or cold. You may have a greater risk of getting burned.  Take over-the-counter and prescription medicines only as told by your health care provider.  Return to your normal activities as told by your health care provider. Ask your health care provider what activities are safe for you.  Keep all follow-up visits as told by your health care provider. This is important. Contact a health care provider if:  You have chills or a fever.  Your  pain does not go away or it gets worse.  You have a cough that does not go away (is persistent). Get help right away if:  You have shortness of breath. This information is not intended to replace advice given to you by your health care provider. Make sure you discuss any questions you have with your health care provider. Document Released: 09/09/2005 Document Revised: 06/19/2016 Document Reviewed: 03/25/2016 Elsevier Interactive Patient Education  2017 [employer].

## 2016-11-11 NOTE — Progress Notes (Addendum)
  Subjective:    Patient ID: Janet Joyce, female    DOB: [birth year], 58 y.o.   MRN: [id number]  HPI Patient is a 60 yo female coming to the office today with complaining of pain that starts in both of her hips and radiates down to her feet. She reports that she only experiences these symptoms at night and that the pain wakes her up from sleeping.She describes the pain has a stabbing pain and rates the pain a 9/10. When the pain wakes her up, she takes one 500 mg Tylenol tablet and goes back to sleep, but she reports that sometimes the pain wakes her up multiple times throughout the night. Patient denies any numbness or tingling. Patient denies any new back pain.   Patient also complains of chest pain on the left side for two weeks. She reports that when she touches her chest, the pain is reproduced. She describes the pain as achy and consant. Patient reports that she has been coughing more for the past week. Patient is complaining of fatigue. Patient has also been out of Symbicort for over a week. Patient denies any new worsening of shortness of breath, fever, dizziness, weakness or vision changes.     Patient also coming to the office to have INR checked for mechanical heart valve.   Review of Systems  All other systems reviewed and are negative. See HPI      Objective:   Physical Exam  Cardiovascular:  Crisp metallic click   Pulmonary/Chest: Effort normal and breath sounds normal. No respiratory distress. She has no wheezes. She has no rales.  Tenderness to palpation along the pectoralis major muscle.   Musculoskeletal:  Tenderness to palpation at greater trochanter, quadriceps, lower legs and feet bilaterally. Full passive ROM with pain with abduction and flexion of hip. Positive FABERs bilaterally. Negative straight leg raise.    INR/Prothrombin Time: 3.2      Assessment & Plan:  Diagnoses and all orders for this visit:  1. Restless leg syndrome due to hip/leg pain only at night  that wakes patient up from sleep.  - Start gabapentin (NEURONTIN) 100 MG capsule; 1-4 capsules by mouth in the evening for restless leg.  2. Costochondritis - Start oral predniSONE (DELTASONE) 50 MG tablet; Take one tablet daily for 5 days to help with inflammation due to recent coughing.   3. Chronic atrial fibrillation (HCC) -   Checked POCT INR- within range at 3.2. Recheck in two weeks.   4. H/O aortic valve replacement -   Checked POCT INR- within range at 3.2. Recheck in two weeks.   5. Chronic bronchitis, unspecified chronic bronchitis type (HCC)  -   Refilled budesonide-formoterol (SYMBICORT) 80-4.5 MCG/ACT inhaler; Inhale 2 puffs into the lungs 2 (two) times daily.               6. Chronic Back Pain   - Refilled Percocet 10-325 mg tablets. Advised patient to only take this medication as needed for pain and to not drive or operate heavy machinery while taking this medication.

## 2016-11-13 ENCOUNTER — Other Ambulatory Visit: Payer: Self-pay | Admitting: Physician Assistant

## 2016-11-13 MED ORDER — FLUTICASONE-SALMETEROL 230-21 MCG/ACT IN AERO: 2.0000 | INHALATION_SPRAY | Freq: Two times a day (BID) | RESPIRATORY_TRACT | 5 refills | Status: DC

## 2016-11-20 ENCOUNTER — Other Ambulatory Visit: Payer: Self-pay | Admitting: Physician Assistant

## 2016-11-25 ENCOUNTER — Ambulatory Visit (INDEPENDENT_AMBULATORY_CARE_PROVIDER_SITE_OTHER): Payer: Medicare Other | Admitting: Physician Assistant

## 2016-11-25 VITALS — BP 138/80 | Resp 60 | Wt 186.0 lb

## 2016-11-25 DIAGNOSIS — Z951 Presence of aortocoronary bypass graft: Secondary | ICD-10-CM

## 2016-11-25 DIAGNOSIS — Z7901 Long term (current) use of anticoagulants: Secondary | ICD-10-CM | POA: Diagnosis not present

## 2016-11-25 DIAGNOSIS — Z952 Presence of prosthetic heart valve: Secondary | ICD-10-CM

## 2016-11-25 DIAGNOSIS — Z5181 Encounter for therapeutic drug level monitoring: Secondary | ICD-10-CM

## 2016-11-25 LAB — POCT INR: INR: 1.4

## 2016-11-25 NOTE — Progress Notes (Signed)
Patient is here for INR check. Patient states she has been taking 4mg  qd except Monday and Friday. Patient denies any changes to her medication regimen or skipped doses.    Increase to 4mg  every day except Friday take 1/2 tablet.  Recheck in 2 weeks. Jade Breeback PA-C.

## 2016-11-26 ENCOUNTER — Telehealth: Payer: Self-pay | Admitting: *Deleted

## 2016-11-26 DIAGNOSIS — F322 Major depressive disorder, single episode, severe without psychotic features: Secondary | ICD-10-CM | POA: Diagnosis not present

## 2016-11-26 NOTE — Telephone Encounter (Signed)
-----   Message from Donella Stade, [place] sent at 11/25/2016  2:38 PM EST -----  Increase to 4mg  every day except Friday take 1/2 tablet.  Recheck in 2 weeks.

## 2016-11-26 NOTE — Telephone Encounter (Signed)
Patient has been notified

## 2016-12-09 ENCOUNTER — Ambulatory Visit (INDEPENDENT_AMBULATORY_CARE_PROVIDER_SITE_OTHER): Payer: Medicare Other | Admitting: Physician Assistant

## 2016-12-09 DIAGNOSIS — I482 Chronic atrial fibrillation, unspecified: Secondary | ICD-10-CM

## 2016-12-09 DIAGNOSIS — Z951 Presence of aortocoronary bypass graft: Secondary | ICD-10-CM | POA: Diagnosis not present

## 2016-12-09 DIAGNOSIS — J441 Chronic obstructive pulmonary disease with (acute) exacerbation: Secondary | ICD-10-CM | POA: Diagnosis not present

## 2016-12-09 DIAGNOSIS — J42 Unspecified chronic bronchitis: Secondary | ICD-10-CM

## 2016-12-09 DIAGNOSIS — Z952 Presence of prosthetic heart valve: Secondary | ICD-10-CM

## 2016-12-09 LAB — POCT INR: INR: 3.6

## 2016-12-09 MED ORDER — OXYCODONE-ACETAMINOPHEN 10-325 MG PO TABS: 1.0000 | ORAL_TABLET | Freq: Four times a day (QID) | ORAL | 0 refills | Status: DC | PRN

## 2016-12-09 NOTE — Progress Notes (Signed)
Decrease to 4mg  every day except 1/2 tablet for Monday and Friday. Recheck in 2 weeks.Iran Planas PA-C

## 2016-12-09 NOTE — Progress Notes (Signed)
Pt advised of dosage change. Will call for appt in 2 weeks.

## 2016-12-23 ENCOUNTER — Ambulatory Visit: Payer: Medicare Other

## 2016-12-25 ENCOUNTER — Ambulatory Visit (INDEPENDENT_AMBULATORY_CARE_PROVIDER_SITE_OTHER): Payer: Medicare Other | Admitting: Physician Assistant

## 2016-12-25 DIAGNOSIS — I482 Chronic atrial fibrillation, unspecified: Secondary | ICD-10-CM

## 2016-12-25 DIAGNOSIS — Z952 Presence of prosthetic heart valve: Secondary | ICD-10-CM

## 2016-12-25 DIAGNOSIS — Z951 Presence of aortocoronary bypass graft: Secondary | ICD-10-CM

## 2016-12-25 LAB — POCT INR

## 2016-12-25 LAB — PROTIME-INR
INR: 4.7 — AB
Prothrombin Time: 46.4 s — ABNORMAL HIGH (ref 9.0–11.5)

## 2016-12-25 MED ORDER — CLOTRIMAZOLE-BETAMETHASONE 1-0.05 % EX CREA: TOPICAL_CREAM | CUTANEOUS | 1 refills | Status: DC

## 2016-12-25 NOTE — Progress Notes (Signed)
Pt advised, verbalized understanding. No further questions.  

## 2016-12-25 NOTE — Progress Notes (Signed)
Pt reports to be taking 4mg  every day except 1/2 tablet for Monday and Friday.   INR blood STAT 4.7.   HOLD tomorrow and the next day. Decrease to 1/2 tablet Monday, Wednesday, Friday and Saturday. Recheck in 1 weeks. Iran Planas PA-C

## 2017-01-01 ENCOUNTER — Ambulatory Visit (INDEPENDENT_AMBULATORY_CARE_PROVIDER_SITE_OTHER): Payer: Medicare Other | Admitting: Physician Assistant

## 2017-01-01 DIAGNOSIS — I482 Chronic atrial fibrillation, unspecified: Secondary | ICD-10-CM

## 2017-01-01 DIAGNOSIS — Z951 Presence of aortocoronary bypass graft: Secondary | ICD-10-CM | POA: Diagnosis not present

## 2017-01-01 DIAGNOSIS — Z952 Presence of prosthetic heart valve: Secondary | ICD-10-CM | POA: Diagnosis not present

## 2017-01-01 LAB — POCT INR: INR: 2.3

## 2017-01-01 NOTE — Progress Notes (Signed)
Pt notified.  Verbalized understanding.

## 2017-01-01 NOTE — Progress Notes (Signed)
Stay on same dose. 1/2 tablet Monday, Wednesday, Friday and Saturday. Recheck in 2 weeks. Iran Planas PA-C

## 2017-01-08 ENCOUNTER — Other Ambulatory Visit: Payer: Self-pay | Admitting: Physician Assistant

## 2017-01-08 DIAGNOSIS — G2581 Restless legs syndrome: Secondary | ICD-10-CM

## 2017-01-11 ENCOUNTER — Other Ambulatory Visit: Payer: Self-pay | Admitting: *Deleted

## 2017-01-11 ENCOUNTER — Ambulatory Visit (INDEPENDENT_AMBULATORY_CARE_PROVIDER_SITE_OTHER): Payer: Medicare Other | Admitting: Physician Assistant

## 2017-01-11 DIAGNOSIS — Z951 Presence of aortocoronary bypass graft: Secondary | ICD-10-CM

## 2017-01-11 DIAGNOSIS — I482 Chronic atrial fibrillation, unspecified: Secondary | ICD-10-CM

## 2017-01-11 DIAGNOSIS — J441 Chronic obstructive pulmonary disease with (acute) exacerbation: Secondary | ICD-10-CM

## 2017-01-11 DIAGNOSIS — Z952 Presence of prosthetic heart valve: Secondary | ICD-10-CM

## 2017-01-11 DIAGNOSIS — J42 Unspecified chronic bronchitis: Secondary | ICD-10-CM

## 2017-01-11 LAB — POCT INR: INR: 4

## 2017-01-11 MED ORDER — OXYCODONE-ACETAMINOPHEN 10-325 MG PO TABS: 1.0000 | ORAL_TABLET | Freq: Four times a day (QID) | ORAL | 0 refills | Status: DC | PRN

## 2017-01-11 NOTE — Progress Notes (Signed)
Decrease dose to  1/2 tablet Sunday,  Monday, Wednesday, Friday and Saturday. 1 4mg  tablet on Tuesday and Thursday. Recheck in 2 weeks.

## 2017-01-12 NOTE — Progress Notes (Signed)
Patient advised of recommendations.  

## 2017-01-18 ENCOUNTER — Other Ambulatory Visit: Payer: Self-pay | Admitting: Physician Assistant

## 2017-01-21 ENCOUNTER — Telehealth: Payer: Self-pay

## 2017-01-21 NOTE — Telephone Encounter (Signed)
Unless known exposure within the home, needs appt.

## 2017-01-21 NOTE — Telephone Encounter (Signed)
Patient advised.

## 2017-01-21 NOTE — Telephone Encounter (Signed)
Janet Joyce complains of fevers, chills, headaches, body aches and sore throat for 1 day. She would like Tamiflu. No history of abnormal kidney functions. Patient advised to call pharmacy later for prescription.

## 2017-01-22 ENCOUNTER — Ambulatory Visit (INDEPENDENT_AMBULATORY_CARE_PROVIDER_SITE_OTHER): Payer: Medicare Other | Admitting: Physician Assistant

## 2017-01-22 VITALS — BP 143/89 | HR 77 | Temp 98.0°F | Wt 185.0 lb

## 2017-01-22 DIAGNOSIS — J441 Chronic obstructive pulmonary disease with (acute) exacerbation: Secondary | ICD-10-CM | POA: Diagnosis not present

## 2017-01-22 DIAGNOSIS — F17200 Nicotine dependence, unspecified, uncomplicated: Secondary | ICD-10-CM | POA: Diagnosis not present

## 2017-01-22 DIAGNOSIS — J111 Influenza due to unidentified influenza virus with other respiratory manifestations: Secondary | ICD-10-CM

## 2017-01-22 MED ORDER — METHYLPREDNISOLONE SODIUM SUCC 125 MG IJ SOLR
125.0000 mg | Freq: Once | INTRAMUSCULAR | Status: AC
  Administered 2017-01-22: 125 mg via INTRAMUSCULAR

## 2017-01-22 MED ORDER — OSELTAMIVIR PHOSPHATE 75 MG PO CAPS: 75.0000 mg | ORAL_CAPSULE | Freq: Two times a day (BID) | ORAL | 0 refills | Status: DC

## 2017-01-22 NOTE — Progress Notes (Signed)
HPI:                                                                Janet Joyce is a 61 y.o. female who presents to [facility] today for flu-like symptoms  Patient with history of COPD, unspecified severity, presents with chills, myalgias, sore throat, congestion, and cough x 2 days. Denies hemoptysis or increased sputum production. Endorses some worsening dyspnea and wheezing. Reports she is using her nebulizer 2-3 times per day. She continues to smoke 0.5ppd. She did not receive influenza vaccine this season. She did receive Pneumovax in 2016.  Past Medical History:  Diagnosis Date  . CHF (congestive heart failure) ([place])   . Depression   . Heart attack   . Hyperlipidemia    No past surgical history on file. Social History  Substance Use Topics  . Smoking status: Current Every Day Smoker    Packs/day: 0.50    Types: Cigarettes  . Smokeless tobacco: Not on file  . Alcohol use No   family history includes Diabetes in her father.  ROS: negative except as noted in the HPI  Medications: Current Outpatient Prescriptions  Medication Sig Dispense Refill  . ARIPiprazole (ABILIFY PO) Take by mouth.    . carvedilol (COREG) 3.125 MG tablet Take 1 tablet (3.125 mg total) by mouth 2 (two) times daily. 60 tablet 5  . chloridazePOXIDE-amitriptyline (LIMBITROL) 5-12.5 MG tablet TAKE 1 TABLET BY MOUTH 3 TIMES A DAY AS NEEDED 75 tablet 2  . clonazePAM (KLONOPIN) 0.5 MG tablet 1 PO TID and 2 QHS (Patient taking differently: Take 0.5 mg by mouth at bedtime. 1 PO TID and 2 QHS) 150 tablet 0  . clotrimazole-betamethasone (LOTRISONE) cream APPLY EXTERNALLY TO THE AFFECTED AREA TWICE DAILY FOR 2 WEEKS. MAY REQUIRE 4 WEEKS IF INVOLVING THE FEET/ TOES 45 g 1  . ezetimibe (ZETIA) 10 MG tablet Take 1 tablet (10 mg total) by mouth daily. 1 tablet 0  . fluticasone-salmeterol (ADVAIR HFA) 230-21 MCG/ACT inhaler Inhale 2 puffs into the lungs 2 (two) times  daily. 1 Inhaler 5  . furosemide (LASIX) 20 MG tablet Take 1-2 tablets (20-40 mg total) by mouth 2 (two) times daily as needed for edema. 120 tablet 2  . gabapentin (NEURONTIN) 100 MG capsule TAKE 1-4 CAPSULES BY MOUTH IN THE EVENING FOR RESTLESS LEG. 90 capsule 1  . ipratropium-albuterol (DUONEB) 0.5-2.5 (3) MG/3ML SOLN Inhale 91mL every four to six hours as needed for cough, shortness of breath, or wheezing. Use via nebulizer. 130 mL 11  . isosorbide mononitrate (IMDUR) 30 MG 24 hr tablet Take 1 tablet (30 mg total) by mouth daily. 30 tablet 5  . oxyCODONE-acetaminophen (PERCOCET) 10-325 MG tablet Take 1-2 tablets by mouth every 6 (six) hours as needed for pain. OK to fill 01/12/2016. 120 tablet 0  . potassium chloride SA (K-DUR,KLOR-CON) 20 MEQ tablet Take 2 tablets (40 mEq total) by mouth daily. 180 tablet 5  . RisperiDONE (RISPERDAL PO) Take by mouth.    . rosuvastatin (CRESTOR) 40 MG tablet TAKE 1 TABLET BY MOUTH EVERY DAY 90 tablet 0  . sertraline (ZOLOFT) 100 MG tablet Take 100 mg by mouth daily.    . temazepam (RESTORIL) 15 MG capsule TK 1 C  PO HS  2  . warfarin (COUMADIN) 4 MG tablet TAKE 1 TABLET BY MOUTH EVERY DAY 90 tablet 0   No current facility-administered medications for this visit.    Allergies  Allergen Reactions  . Ciprofloxacin   . Erythrocin [Erythromycin]   . Hydromet [Hydrocodone-Homatropine]   . Keflex [Cephalexin]   . Lipitor [Atorvastatin]     Oral ulcers  . Morphine And Related   . Niacin And Related     Intolerable Flushing  . Septra [Sulfamethoxazole-Trimethoprim] Nausea And Vomiting and Other (See Comments)       Objective:  BP (!) 143/89   Pulse 77   Temp 98 F (36.7 C) (Oral)   Wt 185 lb (83.9 kg)   SpO2 95%   BMI 32.77 kg/m  Gen: well-groomed, cooperative, not ill-appearing, no distress HEENT: normal conjunctiva, TM's clear, nasal mucosa edematous with rhinorrhea, oropharynx clear with erythema, no exudates, uvula midline, moist mucus  membranes, no frontal or maxillary sinus tenderness Pulm: Normal work of breathing, normal phonation, clear to auscultation bilaterally, no wheezes, rales or rhonchi CV: Normal rate, regular rhythm, s1 and s2 distinct, no murmurs, clicks or rubs  GI: soft, nondistended, nontender Neuro: alert and oriented x 3, EOM's intact Lymph: no cervical or tonsillar adenopathy Skin: warm and dry, no rashes or lesions on exposed skin, no cyanosis   No results found for this or any previous visit (from the past 72 hour(s)). No results found.    Assessment and Plan: 61 y.o. female with   1. Influenza - unable to confirm with swab, but high clinical suspicion given sudden onset and severity of symptoms. She is within the window for Tamiflu - oseltamivir (TAMIFLU) 75 MG capsule; Take 1 capsule (75 mg total) by mouth 2 (two) times daily.  Dispense: 10 capsule; Refill: 0   2. COPD exacerbation ([place]) - lungs sound clear today, SpO2 95% on RA but patient reports using her nebulizer more than baseline and worsening dyspnea/wheezing, so will cover her with loading dose of steroids - cont Advair daily and Duonebs Q4H as needed for wheezing - close follow-up with PCP in 1 week - discussed warning symptoms for when to return or go to the emergency room - methylPREDNISolone sodium succinate (SOLU-MEDROL) 125 mg/2 mL injection 125 mg; Inject 2 mLs (125 mg total) into the muscle once.  3. Tobacco use disorder - recommended smoking cessation and nicotine replacement therapy  Patient education and anticipatory guidance given Patient agrees with treatment plan Follow-up with PCP in 1 week  Darlyne Russian PA-C

## 2017-01-22 NOTE — Patient Instructions (Addendum)
Take prescription Tamiflu twice a day for 5 days  Continue your COPD medications  Use your nebulizer every 4 hours as needed for wheezing  Reduce the number of cigarettes you are smoking, consider smoking cessation and nicotine replacement therapy  Follow-up with Janet Joyce in 1 week or sooner if your symptoms are worsening  Go to the emergency room for high fever, worsening shortness of breath, chest pain and after hours care   Influenza, Adult Influenza, more commonly known as "the flu," is a viral infection that primarily affects the respiratory tract. The respiratory tract includes organs that help you breathe, such as the lungs, nose, and throat. The flu causes many common cold symptoms, as well as a high fever and body aches. The flu spreads easily from person to person (is contagious). Getting a flu shot (influenza vaccination) every year is the best way to prevent influenza. What are the causes? Influenza is caused by a virus. You can catch the virus by:  Breathing in droplets from an infected person's cough or sneeze.  Touching something that was recently contaminated with the virus and then touching your mouth, nose, or eyes. What increases the risk? The following factors may make you more likely to get the flu:  Not cleaning your hands frequently with soap and water or alcohol-based hand sanitizer.  Having close contact with many people during cold and flu season.  Touching your mouth, eyes, or nose without washing or sanitizing your hands first.  Not drinking enough fluids or not eating a healthy diet.  Not getting enough sleep or exercise.  Being under a high amount of stress.  Not getting a yearly (annual) flu shot. You may be at a higher risk of complications from the flu, such as a severe lung infection (pneumonia), if you:  Are over the age of 85.  Are pregnant.  Have a weakened disease-fighting system (immune system). You may have a weakened immune system if  you:  Have HIV or AIDS.  Are undergoing chemotherapy.  Aretaking medicines that reduce the activity of (suppress) the immune system.  Have a long-term (chronic) illness, such as heart disease, kidney disease, diabetes, or lung disease.  Have a liver disorder.  Are obese.  Have anemia. What are the signs or symptoms? Symptoms of this condition typically last 4-10 days and may include:  Fever.  Chills.  Headache, body aches, or muscle aches.  Sore throat.  Cough.  Runny or congested nose.  Chest discomfort and cough.  Poor appetite.  Weakness or tiredness (fatigue).  Dizziness.  Nausea or vomiting. How is this diagnosed? This condition may be diagnosed based on your medical history and a physical exam. Your health care provider may do a nose or throat swab test to confirm the diagnosis. How is this treated? If influenza is detected early, you can be treated with antiviral medicine that can reduce the length of your illness and the severity of your symptoms. This medicine may be given by mouth (orally) or through an IV tube that is inserted in one of your veins. The goal of treatment is to relieve symptoms by taking care of yourself at home. This may include taking over-the-counter medicines, drinking plenty of fluids, and adding humidity to the air in your home. In some cases, influenza goes away on its own. Severe influenza or complications from influenza may be treated in a hospital. Follow these instructions at home:  Take over-the-counter and prescription medicines only as told by your health care  provider.  Use a cool mist humidifier to add humidity to the air in your home. This can make breathing easier.  Rest as needed.  Drink enough fluid to keep your urine clear or pale yellow.  Cover your mouth and nose when you cough or sneeze.  Wash your hands with soap and water often, especially after you cough or sneeze. If soap and water are not available, use  hand sanitizer.  Stay home from work or school as told by your health care provider. Unless you are visiting your health care provider, try to avoid leaving home until your fever has been gone for 24 hours without the use of medicine.  Keep all follow-up visits as told by your health care provider. This is important. How is this prevented?  Getting an annual flu shot is the best way to avoid getting the flu. You may get the flu shot in late summer, fall, or winter. Ask your health care provider when you should get your flu shot.  Wash your hands often or use hand sanitizer often.  Avoid contact with people who are sick during cold and flu season.  Eat a healthy diet, drink plenty of fluids, get enough sleep, and exercise regularly. Contact a health care provider if:  You develop new symptoms.  You have:  Chest pain.  Diarrhea.  A fever.  Your cough gets worse.  You produce more mucus.  You feel nauseous or you vomit. Get help right away if:  You develop shortness of breath or difficulty breathing.  Your skin or nails turn a bluish color.  You have severe pain or stiffness in your neck.  You develop a sudden headache or sudden pain in your face or ear.  You cannot stop vomiting. This information is not intended to replace advice given to you by your health care provider. Make sure you discuss any questions you have with your health care provider. Document Released: 11/27/2000 Document Revised: 05/07/2016 Document Reviewed: 09/24/2015 Elsevier Interactive Patient Education  2017 [employer].

## 2017-01-26 ENCOUNTER — Encounter: Payer: Self-pay | Admitting: Physician Assistant

## 2017-01-26 ENCOUNTER — Ambulatory Visit (INDEPENDENT_AMBULATORY_CARE_PROVIDER_SITE_OTHER): Payer: Medicare Other | Admitting: Physician Assistant

## 2017-01-26 VITALS — BP 112/54 | HR 85 | Ht 63.0 in | Wt 188.5 lb

## 2017-01-26 DIAGNOSIS — B372 Candidiasis of skin and nail: Secondary | ICD-10-CM | POA: Diagnosis not present

## 2017-01-26 DIAGNOSIS — J441 Chronic obstructive pulmonary disease with (acute) exacerbation: Secondary | ICD-10-CM | POA: Diagnosis not present

## 2017-01-26 DIAGNOSIS — I480 Paroxysmal atrial fibrillation: Secondary | ICD-10-CM

## 2017-01-26 LAB — POCT INR: INR: 4.2

## 2017-01-26 MED ORDER — NYSTATIN-TRIAMCINOLONE 100000-0.1 UNIT/GM-% EX OINT: 1.0000 "application " | TOPICAL_OINTMENT | Freq: Two times a day (BID) | CUTANEOUS | 0 refills | Status: DC

## 2017-01-26 MED ORDER — METHYLPREDNISOLONE SODIUM SUCC 125 MG IJ SOLR
125.0000 mg | Freq: Once | INTRAMUSCULAR | Status: AC
  Administered 2017-01-26: 125 mg via INTRAMUSCULAR

## 2017-01-26 MED ORDER — PREDNISONE 50 MG PO TABS: ORAL_TABLET | ORAL | 0 refills | Status: DC

## 2017-01-26 MED ORDER — AZITHROMYCIN 250 MG PO TABS: ORAL_TABLET | ORAL | 0 refills | Status: DC

## 2017-01-26 NOTE — Patient Instructions (Signed)
2mg  every day of coumadin. Recheck INR in 10 days.

## 2017-01-26 NOTE — Progress Notes (Addendum)
   Subjective:    Patient ID: Janet Joyce, female    DOB: 1956-01-30, 62 y.o.   MRN: [id number]  HPI  Pt is a 61 yo female who presents to the clinic with cough, fatigue, SOB and to get INR rechecked.   Pt has COPD and on advair. She was dx with flu last week. She started tamiflu and on the last dose. She continues to feel weak, cough, low grade fevers, SOB and wheezing at times. She has rescue inhaler.   She is taking 2mg  on Monday, Wednesday, Friday, and Saturday. 4mg  on Tuesday and Sunday.   She also complains of itchy and sometimes painful rash under both breast. She is using cream she has at home but not sure what it is. Helps some.    Review of Systems See HPI.     Objective:   Physical Exam  Constitutional: She is oriented to person, place, and time. She appears well-developed and well-nourished.  HENT:  Head: Normocephalic and atraumatic.  Right Ear: External ear normal.  Left Ear: External ear normal.  Nose: Nose normal.  Mouth/Throat: Oropharynx is clear and moist. No oropharyngeal exudate.  Eyes: Conjunctivae are normal.  Neck: Normal range of motion. Neck supple.  Cardiovascular: Normal rate, regular rhythm and normal heart sounds.   Pulmonary/Chest:  Coarse breath sounds. Scattered rhonchi.  No wheezing.   Neurological: She is alert and oriented to person, place, and time.  Skin: Skin is warm.  Bilateral breast underneath erythematous, maceration tissue appears moist.           Assessment & Plan:  [name] KitchenMarland KitchenCarinne was seen today for cough and nasal congestion.  Diagnoses and all orders for this visit:  COPD exacerbation ([place]) -     azithromycin (ZITHROMAX) 250 MG tablet; Take 2 tablets now and then one tablet for 5 days. -     methylPREDNISolone sodium succinate (SOLU-MEDROL) 125 mg/2 mL injection 125 mg; Inject 2 mLs (125 mg total) into the muscle once.  Paroxysmal atrial fibrillation (HCC) -     POCT INR  [facility] intertrigo  Other orders -      Discontinue: predniSONE (DELTASONE) 50 MG tablet; Take one tablet for 5 days. -     Discontinue: nystatin-triamcinolone ointment (MYCOLOG); Apply 1 application topically 2 (two) times daily.   Continue adviar and as needed rescue inhaler. Recheck INR after finish abx.   INR 4.2 decrease to 2mg  every day. rehcheck in 10 days.   Discussed yeast under breast. Sent nystaitin/triamcinolone cream. Discussed keeping area dry for prevention.

## 2017-01-27 ENCOUNTER — Other Ambulatory Visit: Payer: Self-pay | Admitting: *Deleted

## 2017-01-27 MED ORDER — NYSTATIN 100000 UNIT/GM EX OINT: 1.0000 "application " | TOPICAL_OINTMENT | Freq: Two times a day (BID) | CUTANEOUS | 0 refills | Status: DC

## 2017-01-28 ENCOUNTER — Encounter: Payer: Self-pay | Admitting: Physician Assistant

## 2017-01-28 DIAGNOSIS — B372 Candidiasis of skin and nail: Secondary | ICD-10-CM | POA: Insufficient documentation

## 2017-02-03 ENCOUNTER — Telehealth: Payer: Self-pay | Admitting: Physician Assistant

## 2017-02-03 NOTE — Telephone Encounter (Signed)
Pt has an apt on Tuesday with the Nurse and needs her percocet refilled to pick up that day if possible. thanks

## 2017-02-08 ENCOUNTER — Other Ambulatory Visit: Payer: Self-pay | Admitting: *Deleted

## 2017-02-08 DIAGNOSIS — J42 Unspecified chronic bronchitis: Secondary | ICD-10-CM

## 2017-02-08 DIAGNOSIS — J441 Chronic obstructive pulmonary disease with (acute) exacerbation: Secondary | ICD-10-CM

## 2017-02-08 MED ORDER — OXYCODONE-ACETAMINOPHEN 10-325 MG PO TABS: 1.0000 | ORAL_TABLET | Freq: Four times a day (QID) | ORAL | 0 refills | Status: DC | PRN

## 2017-02-08 NOTE — Telephone Encounter (Signed)
Rx placed upfront 

## 2017-02-09 ENCOUNTER — Ambulatory Visit (INDEPENDENT_AMBULATORY_CARE_PROVIDER_SITE_OTHER): Payer: Medicare Other | Admitting: Physician Assistant

## 2017-02-09 DIAGNOSIS — Z951 Presence of aortocoronary bypass graft: Secondary | ICD-10-CM | POA: Diagnosis not present

## 2017-02-09 DIAGNOSIS — Z952 Presence of prosthetic heart valve: Secondary | ICD-10-CM | POA: Diagnosis not present

## 2017-02-09 DIAGNOSIS — I482 Chronic atrial fibrillation, unspecified: Secondary | ICD-10-CM

## 2017-02-09 LAB — PROTIME-INR
INR: 5.9 — ABNORMAL HIGH
Prothrombin Time: 57.9 s — ABNORMAL HIGH (ref 9.0–11.5)

## 2017-02-09 LAB — POCT INR: INR: 5.9

## 2017-02-09 NOTE — Progress Notes (Signed)
HOLD for tomorrow. Decrease dose to 1/2 tablet every day.  Recheck in 2 weeks.  Iran Planas PA-C

## 2017-02-10 NOTE — Progress Notes (Signed)
Pt advised.

## 2017-02-18 DIAGNOSIS — R69 Illness, unspecified: Secondary | ICD-10-CM | POA: Diagnosis not present

## 2017-02-18 DIAGNOSIS — F322 Major depressive disorder, single episode, severe without psychotic features: Secondary | ICD-10-CM | POA: Diagnosis not present

## 2017-02-22 ENCOUNTER — Ambulatory Visit: Payer: Medicare Other | Admitting: Physician Assistant

## 2017-02-23 ENCOUNTER — Encounter: Payer: Self-pay | Admitting: Physician Assistant

## 2017-02-23 ENCOUNTER — Ambulatory Visit (INDEPENDENT_AMBULATORY_CARE_PROVIDER_SITE_OTHER): Payer: Medicare Other

## 2017-02-23 ENCOUNTER — Ambulatory Visit (INDEPENDENT_AMBULATORY_CARE_PROVIDER_SITE_OTHER): Payer: Medicare HMO | Admitting: Physician Assistant

## 2017-02-23 VITALS — BP 133/74 | HR 79 | Ht 63.0 in | Wt 187.0 lb

## 2017-02-23 DIAGNOSIS — J441 Chronic obstructive pulmonary disease with (acute) exacerbation: Secondary | ICD-10-CM

## 2017-02-23 DIAGNOSIS — R05 Cough: Secondary | ICD-10-CM

## 2017-02-23 DIAGNOSIS — I482 Chronic atrial fibrillation, unspecified: Secondary | ICD-10-CM

## 2017-02-23 DIAGNOSIS — H6981 Other specified disorders of Eustachian tube, right ear: Secondary | ICD-10-CM | POA: Diagnosis not present

## 2017-02-23 DIAGNOSIS — R0602 Shortness of breath: Secondary | ICD-10-CM | POA: Diagnosis not present

## 2017-02-23 DIAGNOSIS — Z952 Presence of prosthetic heart valve: Secondary | ICD-10-CM | POA: Diagnosis not present

## 2017-02-23 LAB — POCT INR: INR: 1.9

## 2017-02-23 MED ORDER — METHYLPREDNISOLONE ACETATE 80 MG/ML IJ SUSP
80.0000 mg | Freq: Once | INTRAMUSCULAR | Status: AC
  Administered 2017-02-23: 80 mg via INTRAMUSCULAR

## 2017-02-23 MED ORDER — IPRATROPIUM BROMIDE 0.06 % NA SOLN: 2.0000 | Freq: Four times a day (QID) | NASAL | 12 refills | Status: DC

## 2017-02-23 NOTE — Progress Notes (Signed)
COPD changes. No acute processes.

## 2017-02-23 NOTE — Progress Notes (Signed)
   Subjective:    Patient ID: Janet Joyce, female    DOB: 1956-09-20, 61 y.o.   MRN: [id number]  HPI Pt is a 61 yo female with COPD who comes in with persisent cough, right ear pain, right eye matting. She was seen last on 2/13 and given zpak and solumedrol shot. She is feeling some better but continues to feel congestion. No fever, chills, body aches. She is not taking anything OTC. She has not been able to use nebulizer for 3 weeks due to tubing issue. She continues to use advair daily. Pt continues to smoke daily.   Taking 2mg  daily of coumadin.    Review of Systems    see HPI.  Objective:   Physical Exam  Constitutional: She is oriented to person, place, and time. She appears well-developed and well-nourished.  HENT:  Head: Normocephalic and atraumatic.  Right Ear: External ear normal.  Left Ear: External ear normal.  Nose: Nose normal.  Mouth/Throat: Oropharynx is clear and moist. No oropharyngeal exudate.  TM's clear bilaterally with good light reflex.   Eyes: Conjunctivae are normal. Right eye exhibits no discharge. Left eye exhibits no discharge.  Right eye erythematous around with some puffiness. No drainage.   Neck: Normal range of motion. Neck supple. No thyromegaly present.  Cardiovascular: Normal rate, regular rhythm and normal heart sounds.   Pulmonary/Chest:  Coarse breath sounds. Scattered rhonchi that cleared with cough. No wheezing.   Lymphadenopathy:    She has no cervical adenopathy.  Neurological: She is alert and oriented to person, place, and time.  Psychiatric: She has a normal mood and affect. Her behavior is normal.          Assessment & Plan:  [name] KitchenMarland KitchenDiagnoses and all orders for this visit:  COPD exacerbation ([place]) -     DG Chest 2 View -     methylPREDNISolone acetate (DEPO-MEDROL) injection 80 mg; Inject 1 mL (80 mg total) into the muscle once.  Chronic atrial fibrillation (HCC) -     POCT INR  H/O aortic valve replacement -     POCT  INR  ETD (Eustachian tube dysfunction), right -     ipratropium (ATROVENT) 0.06 % nasal spray; Place 2 sprays into both nostrils 4 (four) times daily.   Discussed I do not think need abx. Will get CXR to confirm.  New tubing given for nebulizer.  Depo medrol 80mg  IM in office today. Declines oral prednisone. I think this will help with both exacerbation and ETD.  Added nasal spray.  Continue to same dose of coumadin due to large number of decrease. Recheck in 2 weeks.  Follow up as needed.

## 2017-03-10 ENCOUNTER — Ambulatory Visit (INDEPENDENT_AMBULATORY_CARE_PROVIDER_SITE_OTHER): Payer: Medicare HMO | Admitting: Physician Assistant

## 2017-03-10 DIAGNOSIS — J441 Chronic obstructive pulmonary disease with (acute) exacerbation: Secondary | ICD-10-CM

## 2017-03-10 DIAGNOSIS — Z952 Presence of prosthetic heart valve: Secondary | ICD-10-CM | POA: Diagnosis not present

## 2017-03-10 DIAGNOSIS — J42 Unspecified chronic bronchitis: Secondary | ICD-10-CM

## 2017-03-10 DIAGNOSIS — I482 Chronic atrial fibrillation, unspecified: Secondary | ICD-10-CM

## 2017-03-10 DIAGNOSIS — Z951 Presence of aortocoronary bypass graft: Secondary | ICD-10-CM | POA: Diagnosis not present

## 2017-03-10 LAB — POCT INR: INR: 2.1

## 2017-03-10 MED ORDER — OXYCODONE-ACETAMINOPHEN 10-325 MG PO TABS: 1.0000 | ORAL_TABLET | Freq: Four times a day (QID) | ORAL | 0 refills | Status: DC | PRN

## 2017-03-10 NOTE — Progress Notes (Signed)
Same dose.  Recheck in 3 weeks

## 2017-03-11 ENCOUNTER — Ambulatory Visit: Payer: Medicare Other

## 2017-03-15 ENCOUNTER — Other Ambulatory Visit: Payer: Self-pay | Admitting: Physician Assistant

## 2017-03-24 ENCOUNTER — Ambulatory Visit (INDEPENDENT_AMBULATORY_CARE_PROVIDER_SITE_OTHER): Payer: Self-pay | Admitting: Physician Assistant

## 2017-03-24 ENCOUNTER — Encounter: Payer: Self-pay | Admitting: Physician Assistant

## 2017-03-24 VITALS — BP 154/74 | HR 83 | Ht 63.0 in | Wt 187.0 lb

## 2017-03-24 DIAGNOSIS — B370 Candidal stomatitis: Secondary | ICD-10-CM

## 2017-03-24 DIAGNOSIS — J3489 Other specified disorders of nose and nasal sinuses: Secondary | ICD-10-CM

## 2017-03-24 DIAGNOSIS — R0982 Postnasal drip: Secondary | ICD-10-CM

## 2017-03-24 DIAGNOSIS — J301 Allergic rhinitis due to pollen: Secondary | ICD-10-CM

## 2017-03-24 MED ORDER — LEVOCETIRIZINE DIHYDROCHLORIDE 5 MG PO TABS
5.0000 mg | ORAL_TABLET | Freq: Every evening | ORAL | 5 refills | Status: DC
Start: 2017-03-24 — End: 2017-07-21

## 2017-03-24 MED ORDER — NYSTATIN 100000 UNIT/ML MT SUSP: 500000.0000 [IU] | Freq: Four times a day (QID) | OROMUCOSAL | 0 refills | Status: DC

## 2017-03-24 NOTE — Patient Instructions (Signed)
Oral Thrush, Adult Oral thrush is an infection in your mouth and throat. It causes white patches on your tongue and in your mouth. Follow these instructions at home: Helping with soreness  To lessen your pain: ? Drink cold liquids, like water and iced tea. ? Eat frozen ice pops or frozen juices. ? Eat foods that are easy to swallow, like gelatin and ice cream. ? Drink from a straw if the patches in your mouth are painful. General instructions   Take or use over-the-counter and prescription medicines only as told by your doctor. Medicine for oral thrush may be something to swallow, or it may be something to put on the infected area.  Eat plain yogurt that has live cultures in it. Read the label to make sure.  If you wear dentures: ? Take out your dentures before you go to bed. ? Brush them well. ? Soak them in a denture cleaner.  Rinse your mouth with warm salt-water many times a day. To make the salt-water mixture, completely dissolve 1/2-1 teaspoon of salt in 1 cup of warm water. Contact a doctor if:  Your problems are getting worse.  Your problems do not get better in less than 7 days with treatment.  Your infection is spreading. This may show as white patches on the skin outside of your mouth.  You are nursing your baby and you have redness and pain in the nipples. This information is not intended to replace advice given to you by your health care provider. Make sure you discuss any questions you have with your health care provider. Document Released: 02/24/2010 Document Revised: 08/24/2016 Document Reviewed: 08/24/2016 Elsevier Interactive Patient Education  2017 Elsevier Inc.  

## 2017-03-26 NOTE — Progress Notes (Signed)
   Subjective:    Patient ID: Janet Joyce, female    DOB: [birth year], 61 y.o.   MRN: [id number]  HPI Pt is a 61 yo female who presents to the clinic with her tongue feeling thick and painful. She admits to not rinsing mouth out after advair use and being on a lot of prednisone recently. She has not tried anything to make better. No fever, chills, n/v.   She continues to have sinus pressure, nasal congestion and rhinitis, and a dry nonproductive cough. She has COPD but does not feel like she is having SOB, wheezing or problems with breathing. She is not taking a daily anti-histamine.    Review of Systems  All other systems reviewed and are negative.      Objective:   Physical Exam  Constitutional: She appears well-developed and well-nourished.  HENT:  Head: Normocephalic and atraumatic.  Right Ear: External ear normal.  Left Ear: External ear normal.  White patches on tongue and bilateral buccal mucosa.  Nasal turbinates red and swollen.  No tenderness over sinuses today to palpation.   Eyes: Conjunctivae are normal. Right eye exhibits no discharge. Left eye exhibits no discharge.  Neck: Normal range of motion. Neck supple.  Cardiovascular: Normal rate and regular rhythm.   Pulmonary/Chest: Effort normal and breath sounds normal. She has no wheezes.  Lymphadenopathy:    She has no cervical adenopathy.          Assessment & Plan:  [name] KitchenMarland KitchenDiagnoses and all orders for this visit:  Oral thrush -     nystatin (MYCOSTATIN) 100000 UNIT/ML suspension; Take 5 mLs (500,000 Units total) by mouth 4 (four) times daily. Swish for 30 seconds and spit out.  Acute seasonal allergic rhinitis due to pollen -     levocetirizine (XYZAL) 5 MG tablet; Take 1 tablet (5 mg total) by mouth every evening.  PND (post-nasal drip) -     levocetirizine (XYZAL) 5 MG tablet; Take 1 tablet (5 mg total) by mouth every evening.  Sinus pressure -     levocetirizine (XYZAL) 5 MG tablet; Take 1 tablet (5 mg  total) by mouth every evening.   Start rinsing after ICS usage. Mouthwash given for 7 days. HO given.   Start xyzal daily. Consider OTC flonase. Discussed I feel like symptoms represent allergies. Lungs sound great today.

## 2017-03-30 ENCOUNTER — Other Ambulatory Visit: Payer: Self-pay | Admitting: *Deleted

## 2017-03-30 MED ORDER — WARFARIN SODIUM 4 MG PO TABS: 4.0000 mg | ORAL_TABLET | Freq: Every day | ORAL | 0 refills | Status: DC

## 2017-03-31 ENCOUNTER — Telehealth: Payer: Self-pay | Admitting: *Deleted

## 2017-03-31 ENCOUNTER — Ambulatory Visit (INDEPENDENT_AMBULATORY_CARE_PROVIDER_SITE_OTHER): Payer: Self-pay | Admitting: Osteopathic Medicine

## 2017-03-31 VITALS — BP 122/74 | HR 73 | Wt 188.0 lb

## 2017-03-31 DIAGNOSIS — Z951 Presence of aortocoronary bypass graft: Secondary | ICD-10-CM

## 2017-03-31 DIAGNOSIS — I482 Chronic atrial fibrillation, unspecified: Secondary | ICD-10-CM

## 2017-03-31 DIAGNOSIS — Z952 Presence of prosthetic heart valve: Secondary | ICD-10-CM

## 2017-03-31 LAB — POCT INR: INR: 3.1

## 2017-03-31 NOTE — Progress Notes (Signed)
Pt here for INR check no missed doses, diet changes,bruising,bleeding,CP,SOB.  Pt reports taking 4 mg on Sunday, 2 mg all other days. Maryruth Eve, Lahoma Crocker

## 2017-03-31 NOTE — Telephone Encounter (Signed)
Called pt and informed her to continue on current dose and f/u in 3 wks.Janet Joyce Lynetta \

## 2017-03-31 NOTE — Progress Notes (Signed)
3 weeks ago was 2.1

## 2017-04-08 ENCOUNTER — Other Ambulatory Visit: Payer: Self-pay | Admitting: *Deleted

## 2017-04-08 DIAGNOSIS — J42 Unspecified chronic bronchitis: Secondary | ICD-10-CM

## 2017-04-08 DIAGNOSIS — J441 Chronic obstructive pulmonary disease with (acute) exacerbation: Secondary | ICD-10-CM

## 2017-04-08 MED ORDER — OXYCODONE-ACETAMINOPHEN 10-325 MG PO TABS: 1.0000 | ORAL_TABLET | Freq: Four times a day (QID) | ORAL | 0 refills | Status: DC | PRN

## 2017-04-22 ENCOUNTER — Ambulatory Visit (INDEPENDENT_AMBULATORY_CARE_PROVIDER_SITE_OTHER): Payer: Medicare HMO | Admitting: Family Medicine

## 2017-04-22 DIAGNOSIS — Z951 Presence of aortocoronary bypass graft: Secondary | ICD-10-CM | POA: Diagnosis not present

## 2017-04-22 DIAGNOSIS — I482 Chronic atrial fibrillation, unspecified: Secondary | ICD-10-CM

## 2017-04-22 DIAGNOSIS — Z952 Presence of prosthetic heart valve: Secondary | ICD-10-CM | POA: Diagnosis not present

## 2017-04-22 LAB — POCT INR: INR: 3.1

## 2017-04-22 NOTE — Progress Notes (Signed)
This can wait until Janet Joyce is back. I do not feel comfortable increasing this medication and not even really 100% sure why she is taking it. And I'm not sure why this isn't coming from her psychiatrist since she is also on other mood stabilizers and other psychiatric medications.

## 2017-04-22 NOTE — Progress Notes (Signed)
Patient requesting different rx amount; states now taking chlordiazepo-amitriptyl 5-12.5  2 tablets tid instead of one tid. pk

## 2017-05-03 ENCOUNTER — Telehealth: Payer: Self-pay | Admitting: Physician Assistant

## 2017-05-03 NOTE — Telephone Encounter (Signed)
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  She stated that they are revoking her license on Friday if they do not receive her paperwork.  She would like a call once it is faxed.  Thanks

## 2017-05-03 NOTE — Telephone Encounter (Signed)
Janet Joyce has received a letter from the [facility] that stated a medical form needs to be completed so that her license will not be revoked. She needs that form to be filled out and faxed to the [facility].

## 2017-05-05 NOTE — Telephone Encounter (Signed)
Let patient know faxed today.

## 2017-05-05 NOTE — Telephone Encounter (Signed)
Pt.notified

## 2017-05-07 ENCOUNTER — Other Ambulatory Visit: Payer: Self-pay | Admitting: *Deleted

## 2017-05-07 ENCOUNTER — Ambulatory Visit: Payer: Medicare HMO

## 2017-05-07 DIAGNOSIS — J42 Unspecified chronic bronchitis: Secondary | ICD-10-CM

## 2017-05-07 DIAGNOSIS — J441 Chronic obstructive pulmonary disease with (acute) exacerbation: Secondary | ICD-10-CM

## 2017-05-07 MED ORDER — OXYCODONE-ACETAMINOPHEN 10-325 MG PO TABS: 1.0000 | ORAL_TABLET | Freq: Four times a day (QID) | ORAL | 0 refills | Status: DC | PRN

## 2017-05-11 ENCOUNTER — Ambulatory Visit: Payer: Medicare HMO

## 2017-05-12 ENCOUNTER — Other Ambulatory Visit: Payer: Self-pay | Admitting: *Deleted

## 2017-05-12 MED ORDER — POTASSIUM CHLORIDE CRYS ER 20 MEQ PO TBCR: 40.0000 meq | EXTENDED_RELEASE_TABLET | Freq: Every day | ORAL | 3 refills | Status: DC

## 2017-05-19 ENCOUNTER — Ambulatory Visit (INDEPENDENT_AMBULATORY_CARE_PROVIDER_SITE_OTHER): Payer: Medicare HMO | Admitting: Physician Assistant

## 2017-05-19 DIAGNOSIS — Z951 Presence of aortocoronary bypass graft: Secondary | ICD-10-CM

## 2017-05-19 DIAGNOSIS — I482 Chronic atrial fibrillation, unspecified: Secondary | ICD-10-CM

## 2017-05-19 DIAGNOSIS — Z952 Presence of prosthetic heart valve: Secondary | ICD-10-CM

## 2017-05-19 LAB — POCT INR: INR: 2.6

## 2017-05-19 NOTE — Progress Notes (Signed)
Pt advised, verbalized understanding. No further questions.  

## 2017-05-19 NOTE — Patient Instructions (Signed)
INR in goal range. Continue current dose. Recheck in 4 weeks.

## 2017-05-19 NOTE — Progress Notes (Signed)
INR in goal range. Continue current dose. Recheck in 4 weeks.Iran Planas PA-C

## 2017-05-20 ENCOUNTER — Other Ambulatory Visit: Payer: Self-pay | Admitting: Physician Assistant

## 2017-05-20 DIAGNOSIS — R69 Illness, unspecified: Secondary | ICD-10-CM | POA: Diagnosis not present

## 2017-05-21 IMAGING — CT CT CHEST W/ CM
2 of 3 series · 15 of 36 positions shown, 18 images · IV contrast (omnipaque)
Comparison: CT chest of 12/17/2014

CLINICAL DATA: Followup pulmonary nodule

EXAM:
CT CHEST WITH CONTRAST
TECHNIQUE: Multidetector CT imaging of the chest was performed during
intravenous contrast administration.
CONTRAST:  75mL OMNIPAQUE IOHEXOL 300 MG/ML  SOLN

[Series 2: chest 5.0 b31f · axial · 0.65mm/px · z∈[+1071,+1321]mm · 12 of 60 slices shown, 15 images]
[im 5/60  mediastinal]
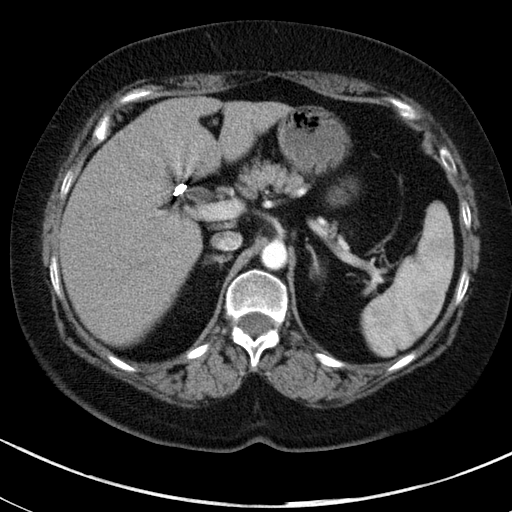
[im 5/60  lung]
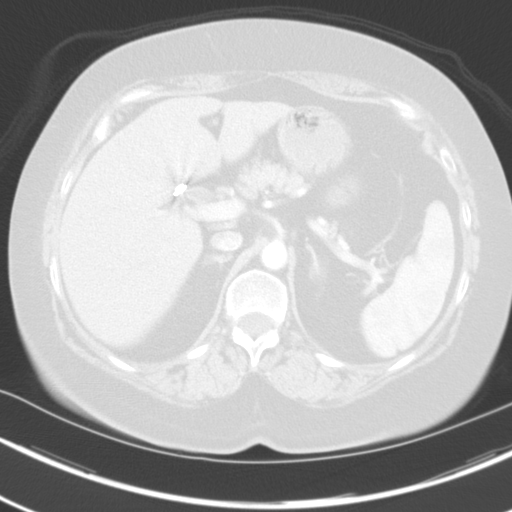
[im 9/60  lung]
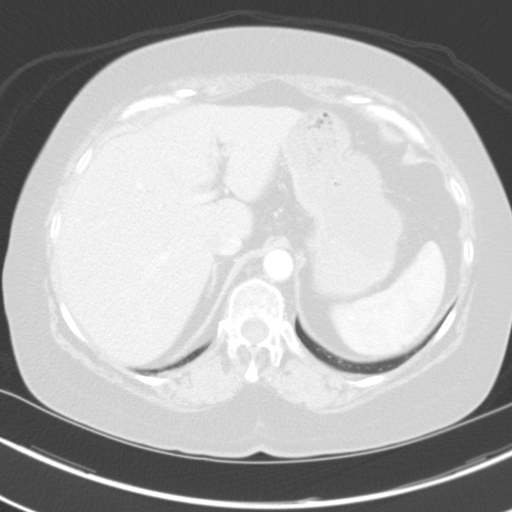
[im 14/60  lung]
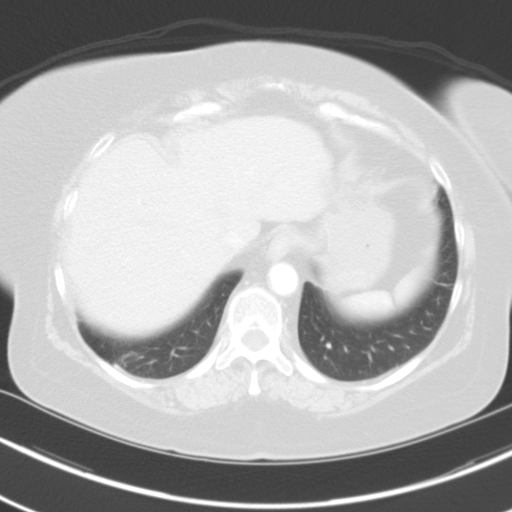
[im 18/60  lung]
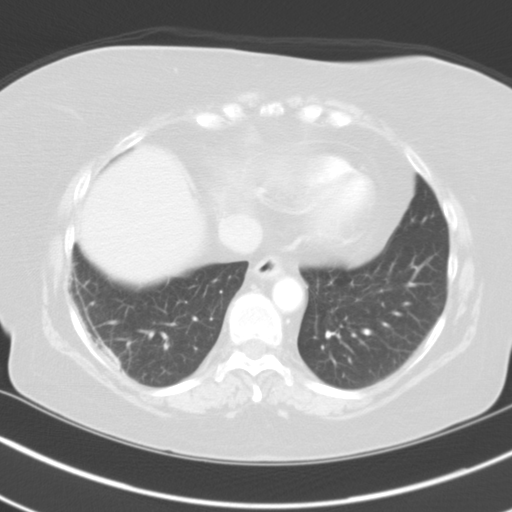
[im 22/60  mediastinal]
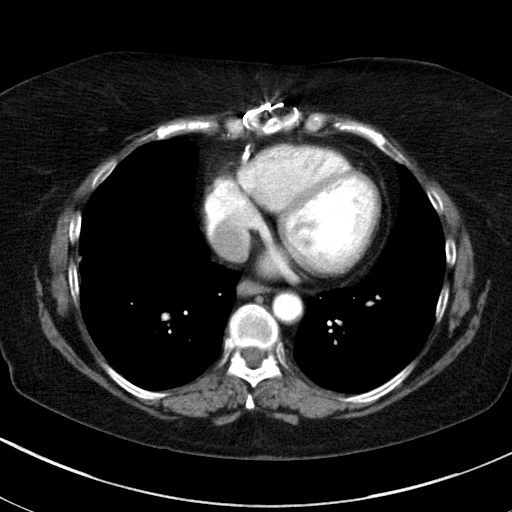
[im 22/60  lung]
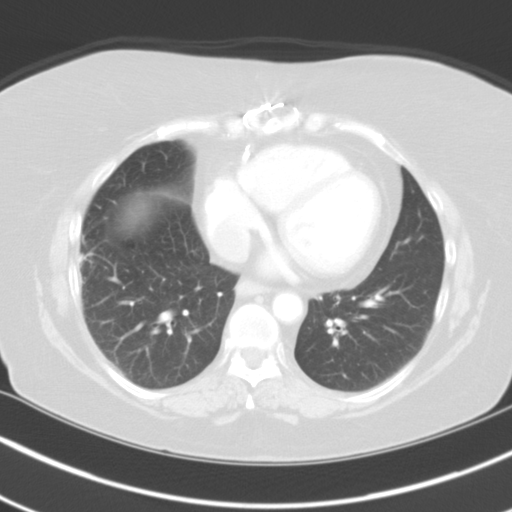
[im 27/60  lung]
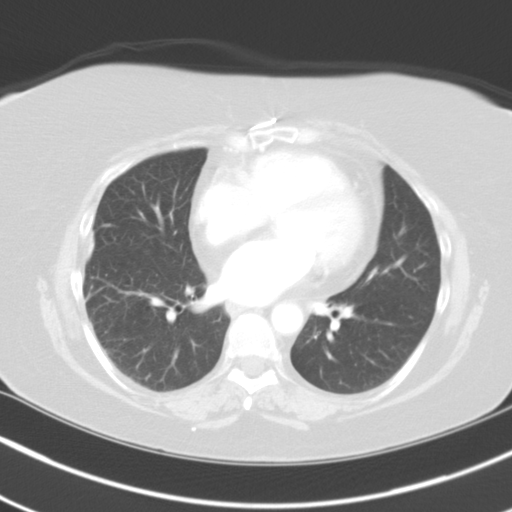
[im 33/60  lung]
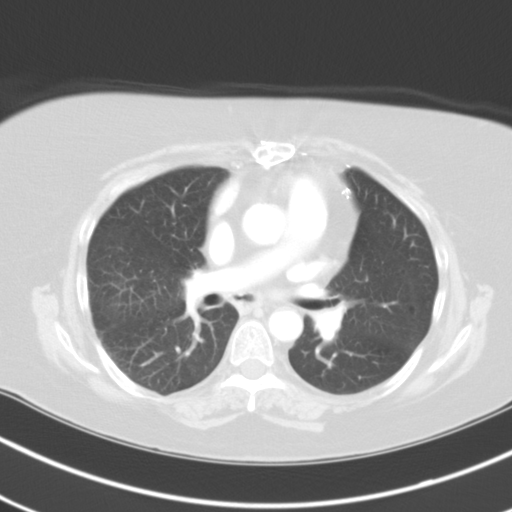
[im 38/60  lung]
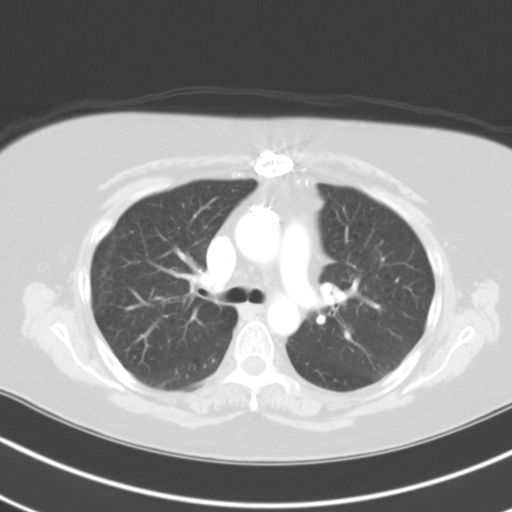
[im 42/60  mediastinal]
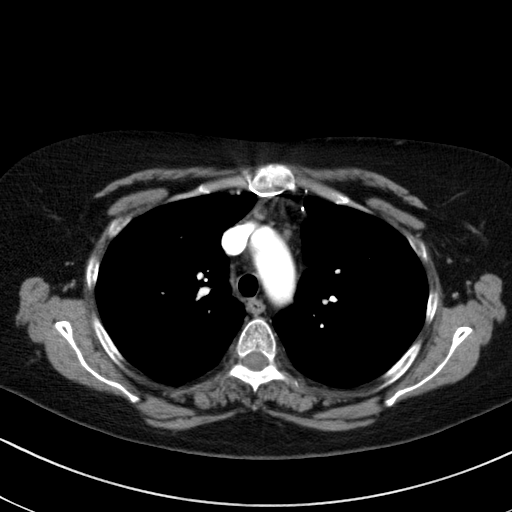
[im 42/60  lung]
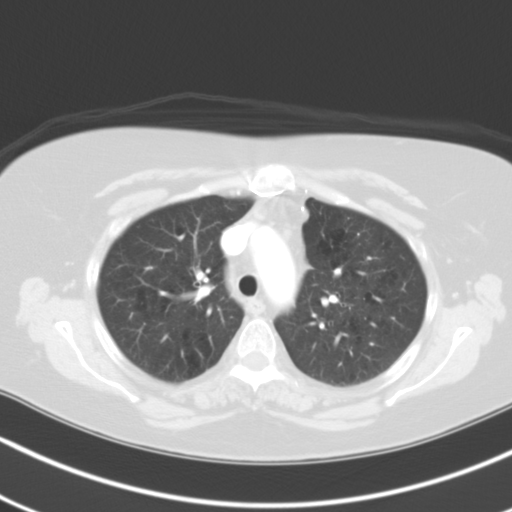
[im 46/60  lung]
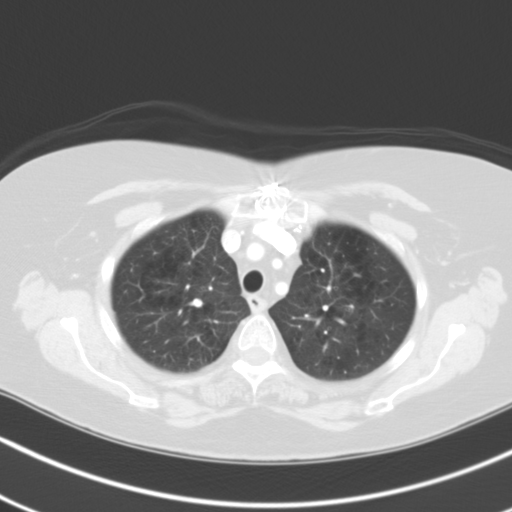
[im 51/60  lung]
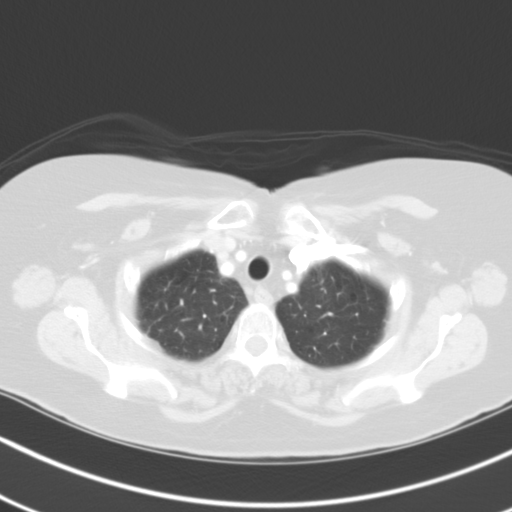
[im 55/60  lung]
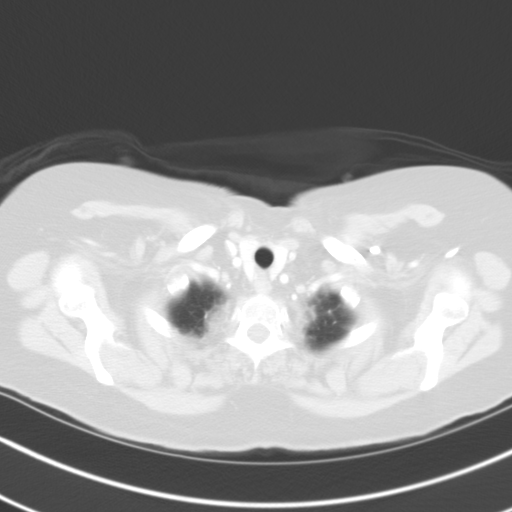

[Series 6: chest 3.0 coronal · coronal · 0.59mm/px · 3 of 86 slices shown]
[im 18/86  lung]
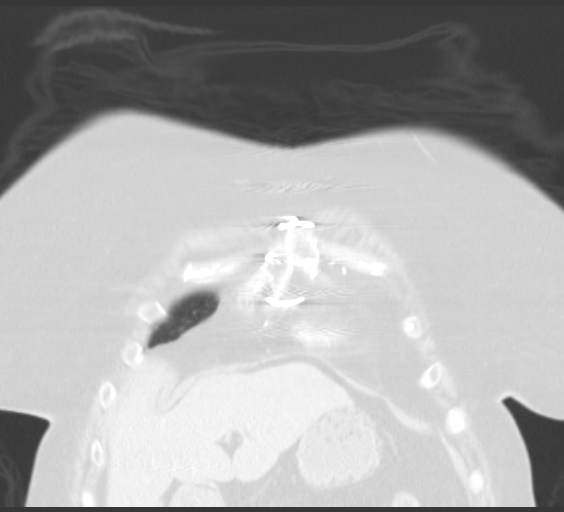
[im 35/86  lung]
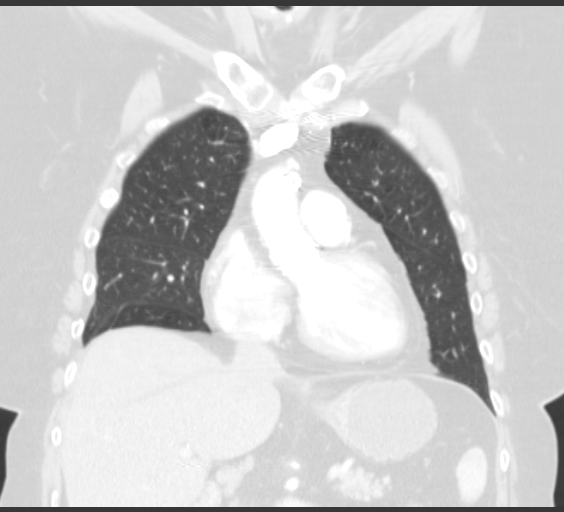
[im 52/86  lung]
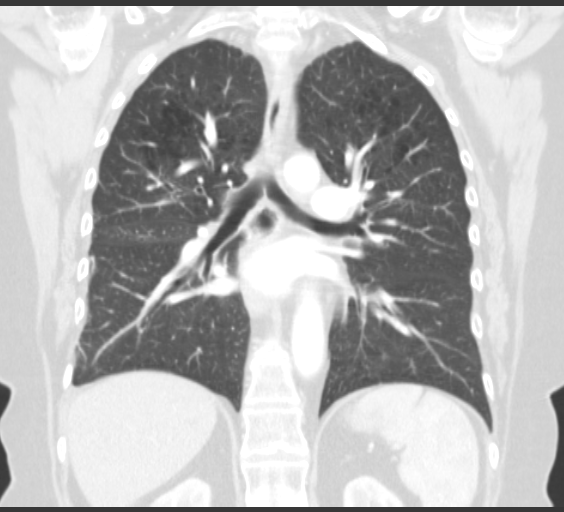

[15 of 36 positions shown; findings below may reference images not displayed]

FINDINGS: On lung window images, diffuse changes of centrilobular emphysema
again are noted. The pleural-based nodular opacity described
previously is completely stable in size and most likely represents
pleural and parenchymal scarring at the right lung base laterally.
Similar linear subpleural scarring is noted more caudally in the
right lower lobe posterior laterally and is stable as well. No new
pulmonary nodule is seen. No focal infiltrate or pleural effusion is
noted. The central airway is widely patent. Median sternotomy
sutures are noted from prior aortic valve replacement.

On soft tissue window images, the thyroid gland is unremarkable. The
thoracic aorta opacifies well and no focal abnormality is seen. The
pulmonary arteries also are well opacified with no abnormality
noted. Mild cardiomegaly is stable. No pericardial effusion is seen.
What is seen of the upper abdomen is unremarkable. The thoracic
vertebrae are in normal alignment with no acute abnormality.
IMPRESSION: Stable pleural-based thickening at the right lung base laterally
most consistent with scarring with some linear subpleural scarring
in the more caudal right lower lobe as well which is stable. No new
or enlarging pulmonary lesion is seen.

## 2017-05-26 DIAGNOSIS — F17200 Nicotine dependence, unspecified, uncomplicated: Secondary | ICD-10-CM | POA: Diagnosis not present

## 2017-05-26 DIAGNOSIS — R0602 Shortness of breath: Secondary | ICD-10-CM | POA: Diagnosis not present

## 2017-05-26 DIAGNOSIS — R69 Illness, unspecified: Secondary | ICD-10-CM | POA: Diagnosis not present

## 2017-05-26 DIAGNOSIS — F17210 Nicotine dependence, cigarettes, uncomplicated: Secondary | ICD-10-CM | POA: Diagnosis not present

## 2017-05-26 LAB — PULMONARY FUNCTION TEST

## 2017-05-31 ENCOUNTER — Other Ambulatory Visit: Payer: Self-pay | Admitting: Physician Assistant

## 2017-06-03 DIAGNOSIS — F17200 Nicotine dependence, unspecified, uncomplicated: Secondary | ICD-10-CM | POA: Diagnosis not present

## 2017-06-03 DIAGNOSIS — R69 Illness, unspecified: Secondary | ICD-10-CM | POA: Diagnosis not present

## 2017-06-03 DIAGNOSIS — Z87891 Personal history of nicotine dependence: Secondary | ICD-10-CM | POA: Diagnosis not present

## 2017-06-04 ENCOUNTER — Other Ambulatory Visit: Payer: Self-pay

## 2017-06-04 DIAGNOSIS — J441 Chronic obstructive pulmonary disease with (acute) exacerbation: Secondary | ICD-10-CM

## 2017-06-04 DIAGNOSIS — J42 Unspecified chronic bronchitis: Secondary | ICD-10-CM

## 2017-06-04 MED ORDER — OXYCODONE-ACETAMINOPHEN 10-325 MG PO TABS: 1.0000 | ORAL_TABLET | Freq: Four times a day (QID) | ORAL | 0 refills | Status: DC | PRN

## 2017-06-15 ENCOUNTER — Ambulatory Visit (INDEPENDENT_AMBULATORY_CARE_PROVIDER_SITE_OTHER): Payer: Medicare HMO | Admitting: Family Medicine

## 2017-06-15 ENCOUNTER — Other Ambulatory Visit: Payer: Self-pay | Admitting: Physician Assistant

## 2017-06-15 DIAGNOSIS — I482 Chronic atrial fibrillation, unspecified: Secondary | ICD-10-CM

## 2017-06-15 DIAGNOSIS — Z952 Presence of prosthetic heart valve: Secondary | ICD-10-CM | POA: Diagnosis not present

## 2017-06-15 DIAGNOSIS — G2581 Restless legs syndrome: Secondary | ICD-10-CM

## 2017-06-15 DIAGNOSIS — Z951 Presence of aortocoronary bypass graft: Secondary | ICD-10-CM | POA: Diagnosis not present

## 2017-06-15 LAB — POCT INR: INR: 3.4

## 2017-06-15 NOTE — Progress Notes (Signed)
See changes. INR 3.4 too high today. NO bleeding, etc.

## 2017-07-01 ENCOUNTER — Ambulatory Visit (INDEPENDENT_AMBULATORY_CARE_PROVIDER_SITE_OTHER): Payer: Medicare HMO | Admitting: Sports Medicine

## 2017-07-01 DIAGNOSIS — I482 Chronic atrial fibrillation, unspecified: Secondary | ICD-10-CM

## 2017-07-01 DIAGNOSIS — Z952 Presence of prosthetic heart valve: Secondary | ICD-10-CM

## 2017-07-01 DIAGNOSIS — Z951 Presence of aortocoronary bypass graft: Secondary | ICD-10-CM

## 2017-07-01 LAB — POCT INR: INR: 2.4

## 2017-07-01 NOTE — Progress Notes (Signed)
Pt is here for a coumadin check. Denies changes in diet, skipped doses, and abnormal bruising. Pt confirmed taking 2 mg daily. Pt advised she will receive with further recommendations. -EH/RMA

## 2017-07-01 NOTE — Progress Notes (Signed)
Pt notified -EH/RMA  

## 2017-07-05 ENCOUNTER — Other Ambulatory Visit: Payer: Self-pay | Admitting: Physician Assistant

## 2017-07-05 DIAGNOSIS — J441 Chronic obstructive pulmonary disease with (acute) exacerbation: Secondary | ICD-10-CM

## 2017-07-05 DIAGNOSIS — J42 Unspecified chronic bronchitis: Secondary | ICD-10-CM

## 2017-07-05 MED ORDER — OXYCODONE-ACETAMINOPHEN 10-325 MG PO TABS: 1.0000 | ORAL_TABLET | Freq: Four times a day (QID) | ORAL | 0 refills | Status: DC | PRN

## 2017-07-05 NOTE — Telephone Encounter (Signed)
Patient called request to have percocet refilled and would like to pick of prescription on Wednesday that is when it is due. Thanks

## 2017-07-10 ENCOUNTER — Other Ambulatory Visit: Payer: Self-pay | Admitting: Physician Assistant

## 2017-07-15 ENCOUNTER — Ambulatory Visit (INDEPENDENT_AMBULATORY_CARE_PROVIDER_SITE_OTHER): Payer: Medicare HMO | Admitting: Osteopathic Medicine

## 2017-07-15 VITALS — BP 140/78 | Wt 188.0 lb

## 2017-07-15 DIAGNOSIS — I482 Chronic atrial fibrillation, unspecified: Secondary | ICD-10-CM

## 2017-07-15 DIAGNOSIS — Z952 Presence of prosthetic heart valve: Secondary | ICD-10-CM | POA: Diagnosis not present

## 2017-07-15 DIAGNOSIS — Z951 Presence of aortocoronary bypass graft: Secondary | ICD-10-CM | POA: Diagnosis not present

## 2017-07-15 LAB — POCT INR: INR: 4.7

## 2017-07-15 NOTE — Progress Notes (Signed)
Patient was sent to the lab since INR today was over 4.5. Patient was in a hurry to get to her next appointment so we didn't recheck her blood pressure. She states she was very nervous because she does not like the BP cuff since it causes bruising.

## 2017-07-15 NOTE — Progress Notes (Signed)
INR reviewed   Most recent note 07/01/17 doesn't make sense, it states new dose of 2 mg daily and this seems to be with the patient is taking based on conversation with the nurse 07/15/17, but the description says the dose was increased to take half a tab daily except for Fridays full tab. Not sure which dosing is accurate? Can we double check on what the patient has been taking?  If she has been taking half a 4 mg tablet daily (2 mg daily) - if she still has 3 mg tablets she can be taking half of the 3 mg tablet (1.5 mg) once per week and can otherwise keep her dose the same.  If she has been taking a whole tablet of the 4 mg tablets she should take half of a tablet daily, 2 mg daily.

## 2017-07-16 LAB — PROTIME-INR
INR: 3.8 — AB
Prothrombin Time: 40 s — ABNORMAL HIGH (ref 9.0–11.5)

## 2017-07-16 NOTE — Progress Notes (Signed)
Okay, can go back down to 2 mg daily. If she has already taken the higher dose today, we can recheck INR in 10 days

## 2017-07-16 NOTE — Progress Notes (Signed)
Called patient and she did confirm that she takes 2 mg everyday except Friday. On Friday she takes 4 mg

## 2017-07-16 NOTE — Progress Notes (Signed)
Patient notified and she has not taken anything today. She will return in 10 days to have INR checked

## 2017-07-21 ENCOUNTER — Ambulatory Visit (INDEPENDENT_AMBULATORY_CARE_PROVIDER_SITE_OTHER): Payer: Medicare HMO | Admitting: Family Medicine

## 2017-07-21 VITALS — BP 140/80 | HR 80 | Temp 97.6°F | Wt 190.0 lb

## 2017-07-21 DIAGNOSIS — Z951 Presence of aortocoronary bypass graft: Secondary | ICD-10-CM | POA: Diagnosis not present

## 2017-07-21 DIAGNOSIS — J40 Bronchitis, not specified as acute or chronic: Secondary | ICD-10-CM | POA: Diagnosis not present

## 2017-07-21 DIAGNOSIS — Z1159 Encounter for screening for other viral diseases: Secondary | ICD-10-CM

## 2017-07-21 DIAGNOSIS — E785 Hyperlipidemia, unspecified: Secondary | ICD-10-CM

## 2017-07-21 DIAGNOSIS — R942 Abnormal results of pulmonary function studies: Secondary | ICD-10-CM | POA: Diagnosis not present

## 2017-07-21 DIAGNOSIS — Z952 Presence of prosthetic heart valve: Secondary | ICD-10-CM

## 2017-07-21 DIAGNOSIS — I482 Chronic atrial fibrillation, unspecified: Secondary | ICD-10-CM

## 2017-07-21 DIAGNOSIS — I10 Essential (primary) hypertension: Secondary | ICD-10-CM

## 2017-07-21 DIAGNOSIS — E781 Pure hyperglyceridemia: Secondary | ICD-10-CM | POA: Diagnosis not present

## 2017-07-21 MED ORDER — BENZONATATE 200 MG PO CAPS: 200.0000 mg | ORAL_CAPSULE | Freq: Three times a day (TID) | ORAL | 0 refills | Status: DC | PRN

## 2017-07-21 MED ORDER — ALBUTEROL SULFATE HFA 108 (90 BASE) MCG/ACT IN AERS: 2.0000 | INHALATION_SPRAY | Freq: Four times a day (QID) | RESPIRATORY_TRACT | 0 refills | Status: DC | PRN

## 2017-07-21 MED ORDER — CHLORDIAZEPOXIDE-AMITRIPTYLINE 5-12.5 MG PO TABS: 1.0000 | ORAL_TABLET | Freq: Three times a day (TID) | ORAL | 2 refills | Status: DC | PRN

## 2017-07-21 NOTE — Patient Instructions (Addendum)
Thank you for coming in today. Use albuterol as needed.  USe tessalon perles for cough as needed.  Call or go to the emergency room if you get worse, have trouble breathing, have chest pains, or palpitations.   Get fasting labs in the near future.   Recheck with Jade in 1-3 months for follow up lungs.   Return for a nurse visit in 2 -3 weeks for INR recheck.

## 2017-07-21 NOTE — Progress Notes (Signed)
Janet Joyce is a 61 y.o. female who presents to [place] [place] today for cough and congestion. Patient notes a several week history of cough congestion and wheezing. She denies any fevers chills vomiting or diarrhea. No chest pain palpitations or significant severe shortness of breath. She has not tried any treatment yet. She feels well otherwise. She had symptoms are consistent with prior episodes of bronchitis. She does not think it is bad enough that she needs prednisone.  Additionally patient notes that she needs a refill of Limbitriol   Past Medical History:  Diagnosis Date  . CHF (congestive heart failure) ([place])   . Depression   . Heart attack ([place])   . Hyperlipidemia    No past surgical history on file. Social History  Substance Use Topics  . Smoking status: Current Every Day Smoker    Packs/day: 0.50    Types: Cigarettes  . Smokeless tobacco: Never Used  . Alcohol use No   family history includes Diabetes in her father.  ROS as above:  Medications: Current Outpatient Prescriptions  Medication Sig Dispense Refill  . albuterol (PROVENTIL HFA;VENTOLIN HFA) 108 (90 Base) MCG/ACT inhaler Inhale 2 puffs into the lungs every 6 (six) hours as needed for wheezing or shortness of breath. 1 Inhaler 0  . ARIPiprazole (ABILIFY) 2 MG tablet 2 mg.    . aspirin 81 MG tablet Take 81 mg by mouth.    . benzonatate (TESSALON) 200 MG capsule Take 1 capsule (200 mg total) by mouth 3 (three) times daily as needed for cough. 45 capsule 0  . carvedilol (COREG) 3.125 MG tablet Take 1 tablet (3.125 mg total) by mouth 2 (two) times daily. 60 tablet 5  . chloridazePOXIDE-amitriptyline (LIMBITROL) 5-12.5 MG tablet Take 1 tablet by mouth 3 (three) times daily as needed. 90 tablet 2  . clonazePAM (KLONOPIN) 0.5 MG tablet 1 PO TID and 2 QHS (Patient taking differently: Take 0.5 mg by  mouth at bedtime. 1 PO TID and 2 QHS) 150 tablet 0  . clotrimazole-betamethasone (LOTRISONE) cream APPLY EXTERNALLY TO THE AFFECTED AREA TWICE DAILY FOR 2 WEEKS. MAY REQUIRE 4 WEEKS IF INVOLVING THE FEET/ TOES 45 g 1  . ezetimibe (ZETIA) 10 MG tablet Take 1 tablet (10 mg total) by mouth daily. 1 tablet 0  . gabapentin (NEURONTIN) 100 MG capsule TAKE 1-4 CAPSULES BY MOUTH IN THE EVENING FOR RESTLESS LEG. 90 capsule 1  . ipratropium (ATROVENT) 0.06 % nasal spray Place 2 sprays into both nostrils 4 (four) times daily. 15 mL 12  . ipratropium-albuterol (DUONEB) 0.5-2.5 (3) MG/3ML SOLN Inhale 70mL every four to six hours as needed for cough, shortness of breath, or wheezing. Use via nebulizer. 130 mL 11  . isosorbide mononitrate (IMDUR) 30 MG 24 hr tablet Take 1 tablet (30 mg total) by mouth daily. 30 tablet 5  . nystatin (MYCOSTATIN) 100000 UNIT/ML suspension Take 5 mLs (500,000 Units total) by mouth 4 (four) times daily. Swish for 30 seconds and spit out. 180 mL 0  . nystatin ointment (MYCOSTATIN) Apply 1 application topically 2 (two) times daily. 30 g 0  . oxyCODONE-acetaminophen (PERCOCET) 10-325 MG tablet Take 1-2 tablets by mouth every 6 (six) hours as needed for pain. 120 tablet 0  . potassium chloride SA (KLOR-CON M20) 20 MEQ tablet Take 2 tablets (40 mEq total) by mouth daily. 180 tablet 3  . RisperiDONE (RISPERDAL PO) Take by mouth.    . rosuvastatin (CRESTOR)  40 MG tablet TAKE 1 TABLET BY MOUTH EVERY DAY 90 tablet 0  . sertraline (ZOLOFT) 100 MG tablet Take 100 mg by mouth daily.    . temazepam (RESTORIL) 15 MG capsule TK 1 C PO HS  2  . warfarin (COUMADIN) 4 MG tablet Take 1 tablet (4 mg total) by mouth daily. 90 tablet 0  . warfarin (COUMADIN) 4 MG tablet TAKE 1 TABLET BY MOUTH EVERY DAY 90 tablet 0   No current facility-administered medications for this visit.    Allergies  Allergen Reactions  . Ciprofloxacin   . Erythrocin [Erythromycin]   . Hydromet [Hydrocodone-Homatropine]   .  Keflex [Cephalexin]   . Lipitor [Atorvastatin]     Oral ulcers  . Morphine And Related   . Niacin And Related     Intolerable Flushing  . Prednisone     Keeps you awake.   Sarina Ill [Sulfamethoxazole-Trimethoprim] Nausea And Vomiting and Other (See Comments)    Health Maintenance Health Maintenance  Topic Date Due  . Hepatitis C Screening  [birth year]  . COLONOSCOPY  02/11/2008  . INFLUENZA VACCINE  08/12/2017 (Originally 07/14/2017)  . PAP SMEAR  08/12/2017 (Originally 10/15/2015)  . TETANUS/TDAP  08/12/2017 (Originally 02/10/1977)  . HIV Screening  08/12/2017 (Originally 02/10/1973)  . MAMMOGRAM  08/06/2018     Exam:  BP 140/80   Pulse 80   Temp 97.6 F (36.4 C) (Oral)   Wt 190 lb (86.2 kg)   BMI 33.66 kg/m  Gen: Well NAD HEENT: EOMI,  MMM Clear nasal discharge. Posterior pharynx with cobblestoning. Normal tympanic membranes bilaterally. Lungs: Normal work of breathing. Wheezing present bilaterally Heart: RRR no MRG Abd: NABS, Soft. Nondistended, Nontender Exts: Brisk capillary refill, warm and well perfused.    No results found for this or any previous visit (from the past 72 hour(s)). No results found.    Assessment and Plan: 61 y.o. female with  Viral bronchitis. Plan to treat with Tessalon Perles and albuterol. Watchful waiting return if not better.  Refill Limbitrol.   Patient notes that she has an upcoming appointment with her primary care provider would like to have fasting labs ordered prior to that visit. Order labs listed below.   Orders Placed This Encounter  Procedures  . CBC  . COMPLETE METABOLIC PANEL WITH GFR  . Lipid Panel w/reflex Direct LDL  . TSH  . Hepatitis C antibody  . INR/PT   Meds ordered this encounter  Medications  . chloridazePOXIDE-amitriptyline (LIMBITROL) 5-12.5 MG tablet    Sig: Take 1 tablet by mouth 3 (three) times daily as needed.    Dispense:  90 tablet    Refill:  2  . albuterol (PROVENTIL HFA;VENTOLIN HFA) 108 (90  Base) MCG/ACT inhaler    Sig: Inhale 2 puffs into the lungs every 6 (six) hours as needed for wheezing or shortness of breath.    Dispense:  1 Inhaler    Refill:  0  . benzonatate (TESSALON) 200 MG capsule    Sig: Take 1 capsule (200 mg total) by mouth 3 (three) times daily as needed for cough.    Dispense:  45 capsule    Refill:  0     Discussed warning signs or symptoms. Please see discharge instructions. Patient expresses understanding.

## 2017-07-22 ENCOUNTER — Telehealth: Payer: Self-pay | Admitting: Family Medicine

## 2017-07-22 MED ORDER — GUAIFENESIN ER 600 MG PO TB12: 600.0000 mg | ORAL_TABLET | Freq: Two times a day (BID) | ORAL | 1 refills | Status: DC

## 2017-07-22 NOTE — Telephone Encounter (Signed)
Pt called and said that tessalon was not covered by insurance.  I am happy to prescribe an alternaitve but pt seems to be allergic to Codeine, or Hydrocodone.  There are not other prescription cough medicines.  I recommend guaifenesin which I will prescription sent to the pharmacy

## 2017-07-28 ENCOUNTER — Encounter: Payer: Self-pay | Admitting: Physician Assistant

## 2017-07-28 DIAGNOSIS — R0602 Shortness of breath: Secondary | ICD-10-CM | POA: Insufficient documentation

## 2017-07-29 DIAGNOSIS — R942 Abnormal results of pulmonary function studies: Secondary | ICD-10-CM | POA: Diagnosis not present

## 2017-07-29 DIAGNOSIS — I10 Essential (primary) hypertension: Secondary | ICD-10-CM | POA: Diagnosis not present

## 2017-07-29 DIAGNOSIS — Z951 Presence of aortocoronary bypass graft: Secondary | ICD-10-CM | POA: Diagnosis not present

## 2017-07-29 DIAGNOSIS — Z1159 Encounter for screening for other viral diseases: Secondary | ICD-10-CM | POA: Diagnosis not present

## 2017-07-29 DIAGNOSIS — Z952 Presence of prosthetic heart valve: Secondary | ICD-10-CM | POA: Diagnosis not present

## 2017-07-29 DIAGNOSIS — J40 Bronchitis, not specified as acute or chronic: Secondary | ICD-10-CM | POA: Diagnosis not present

## 2017-07-29 DIAGNOSIS — E781 Pure hyperglyceridemia: Secondary | ICD-10-CM | POA: Diagnosis not present

## 2017-07-29 DIAGNOSIS — E785 Hyperlipidemia, unspecified: Secondary | ICD-10-CM | POA: Diagnosis not present

## 2017-07-29 DIAGNOSIS — I482 Chronic atrial fibrillation: Secondary | ICD-10-CM | POA: Diagnosis not present

## 2017-07-29 LAB — CBC
HCT: 42.7 % (ref 35.0–45.0)
Hemoglobin: 13.5 g/dL (ref 11.7–15.5)
MCH: 27.2 pg (ref 27.0–33.0)
MCHC: 31.6 g/dL — ABNORMAL LOW (ref 32.0–36.0)
MCV: 85.9 fL (ref 80.0–100.0)
MPV: 9.9 fL (ref 7.5–12.5)
PLATELETS: 267 10*3/uL (ref 140–400)
RBC: 4.97 MIL/uL (ref 3.80–5.10)
RDW: 16 % — AB (ref 11.0–15.0)
WBC: 7.3 10*3/uL (ref 3.8–10.8)

## 2017-07-30 ENCOUNTER — Telehealth: Payer: Self-pay | Admitting: Physician Assistant

## 2017-07-30 LAB — COMPLETE METABOLIC PANEL WITH GFR
ALT: 23 U/L (ref 6–29)
AST: 24 U/L (ref 10–35)
Albumin: 4 g/dL (ref 3.6–5.1)
Alkaline Phosphatase: 83 U/L (ref 33–130)
BILIRUBIN TOTAL: 0.4 mg/dL (ref 0.2–1.2)
BUN: 8 mg/dL (ref 7–25)
CALCIUM: 9.2 mg/dL (ref 8.6–10.4)
CHLORIDE: 105 mmol/L (ref 98–110)
CO2: 25 mmol/L (ref 20–32)
Creat: 0.81 mg/dL (ref 0.50–1.05)
GFR, Est Non African American: 80 mL/min (ref >=60)
Glucose, Bld: 128 mg/dL — ABNORMAL HIGH (ref 65–99)
Potassium: 4.3 mmol/L (ref 3.5–5.3)
Sodium: 140 mmol/L (ref 135–146)
Total Protein: 6.2 g/dL (ref 6.1–8.1)

## 2017-07-30 LAB — LIPID PANEL W/REFLEX DIRECT LDL
CHOLESTEROL: 138 mg/dL (ref <200)
HDL: 37 mg/dL — AB (ref >50)
LDL-CHOLESTEROL: 68 mg/dL
NON-HDL CHOLESTEROL (CALC): 101 mg/dL (ref <130)
TRIGLYCERIDES: 248 mg/dL — AB (ref <150)
Total CHOL/HDL Ratio: 3.7 Ratio (ref <5.0)

## 2017-07-30 LAB — PROTIME-INR
INR: 2.6 — AB
Prothrombin Time: 27.4 s — ABNORMAL HIGH (ref 9.0–11.5)

## 2017-07-30 LAB — TSH: TSH: 5.79 mIU/L — ABNORMAL HIGH

## 2017-07-30 LAB — HEPATITIS C ANTIBODY: HCV Ab: NONREACTIVE

## 2017-07-30 NOTE — Telephone Encounter (Signed)
PT needed to know her coumadin levels of her blood test, please contact pt about this

## 2017-07-30 NOTE — Telephone Encounter (Signed)
Called patient and let her know her results -- al

## 2017-08-05 ENCOUNTER — Telehealth: Payer: Self-pay

## 2017-08-05 NOTE — Telephone Encounter (Signed)
Needs a refill on percocet

## 2017-08-06 ENCOUNTER — Other Ambulatory Visit: Payer: Self-pay | Admitting: Physician Assistant

## 2017-08-06 DIAGNOSIS — J42 Unspecified chronic bronchitis: Secondary | ICD-10-CM

## 2017-08-06 DIAGNOSIS — J441 Chronic obstructive pulmonary disease with (acute) exacerbation: Secondary | ICD-10-CM

## 2017-08-06 DIAGNOSIS — Z1231 Encounter for screening mammogram for malignant neoplasm of breast: Secondary | ICD-10-CM | POA: Diagnosis not present

## 2017-08-06 LAB — HM MAMMOGRAPHY

## 2017-08-06 MED ORDER — OXYCODONE-ACETAMINOPHEN 10-325 MG PO TABS: 1.0000 | ORAL_TABLET | Freq: Four times a day (QID) | ORAL | 0 refills | Status: DC | PRN

## 2017-08-06 NOTE — Telephone Encounter (Signed)
Ready for pick up. Needs appt in 1 month.

## 2017-08-06 NOTE — Telephone Encounter (Signed)
Pt called again about refill on Percocet. She wants [place] to call her once we put rx up front.  Thank you

## 2017-08-09 ENCOUNTER — Ambulatory Visit: Payer: Medicare HMO

## 2017-08-10 ENCOUNTER — Ambulatory Visit: Payer: Medicare HMO

## 2017-08-10 ENCOUNTER — Ambulatory Visit: Payer: Medicare HMO | Admitting: Physician Assistant

## 2017-08-12 DIAGNOSIS — R69 Illness, unspecified: Secondary | ICD-10-CM | POA: Diagnosis not present

## 2017-08-17 ENCOUNTER — Ambulatory Visit (INDEPENDENT_AMBULATORY_CARE_PROVIDER_SITE_OTHER): Payer: Medicare HMO | Admitting: Physician Assistant

## 2017-08-17 ENCOUNTER — Other Ambulatory Visit: Payer: Self-pay | Admitting: Physician Assistant

## 2017-08-17 ENCOUNTER — Encounter: Payer: Self-pay | Admitting: Physician Assistant

## 2017-08-17 VITALS — BP 145/67 | HR 70 | Wt 188.0 lb

## 2017-08-17 DIAGNOSIS — I482 Chronic atrial fibrillation, unspecified: Secondary | ICD-10-CM

## 2017-08-17 DIAGNOSIS — H905 Unspecified sensorineural hearing loss: Secondary | ICD-10-CM | POA: Diagnosis not present

## 2017-08-17 DIAGNOSIS — R7989 Other specified abnormal findings of blood chemistry: Secondary | ICD-10-CM

## 2017-08-17 DIAGNOSIS — R946 Abnormal results of thyroid function studies: Secondary | ICD-10-CM | POA: Diagnosis not present

## 2017-08-17 LAB — POCT INR: INR: 2.2

## 2017-08-17 NOTE — Progress Notes (Signed)
   Subjective:    Patient ID: Janet Joyce, female    DOB: 08/19/1956, 61 y.o.   MRN: [id number]  HPI  Pt is a 61 yo female who presents to the clinic to have INR recheck and discuss labs with elevated TSH.   Pt is currently taking coumadin 2mg  every day but Friday 4mg .   On last labs TSH was elevated just a bit. She wonders if could be contributing to her weight. She has pretty good energy.   She also mentions that she feels like she has some hearing loss in left ear. Denies any pain or discharge.     Review of Systems  All other systems reviewed and are negative.      Objective:   Physical Exam  Constitutional: She is oriented to person, place, and time. She appears well-developed and well-nourished.  HENT:  Head: Normocephalic and atraumatic.  Right Ear: External ear normal.  Left Ear: External ear normal.  Neck: Normal range of motion. Neck supple. No thyromegaly present.  Cardiovascular: Normal rate and regular rhythm.   Mechanical valve heard.   Pulmonary/Chest: Effort normal and breath sounds normal.  Neurological: She is alert and oriented to person, place, and time.  Psychiatric: She has a normal mood and affect. Her behavior is normal.          Assessment & Plan:  [name] KitchenMarland KitchenChieko was seen today for discuss most recent lab.  Diagnoses and all orders for this visit:  Chronic atrial fibrillation (HCC) -     POCT INR  Elevated TSH -     TSH  Sensorineural hearing loss (SNHL) of left ear, unspecified hearing status on contralateral side -     Ambulatory referral to Audiology   .[name] Kitchen Results for orders placed or performed in visit on 08/17/17  POCT INR  Result Value Ref Range   INR 2.2    INR was perfect. Stay on same dose and recheck in 4 weeks.   Discussed how TSH was barely elevated and likely would normalize since she has started to feel better. Lab order given to have checked in 2-3 weeks. If still elevated will consider treating low thyroid hormone.   Discussed she is on a lot of psych medications that could be causing some ofher weight gain. Discuss with psych risk vs benefit of switching around medications for weight loss.   Discussed no conductive hearing loss. Will refer to audiologist. She sees Dr. Coralie Keens ENT.

## 2017-08-18 ENCOUNTER — Encounter: Payer: Self-pay | Admitting: Physician Assistant

## 2017-08-18 ENCOUNTER — Ambulatory Visit: Payer: Medicare HMO | Admitting: Physician Assistant

## 2017-08-19 ENCOUNTER — Encounter: Payer: Self-pay | Admitting: Physician Assistant

## 2017-08-19 DIAGNOSIS — R7989 Other specified abnormal findings of blood chemistry: Secondary | ICD-10-CM | POA: Insufficient documentation

## 2017-08-21 ENCOUNTER — Other Ambulatory Visit: Payer: Self-pay | Admitting: Physician Assistant

## 2017-08-21 DIAGNOSIS — G2581 Restless legs syndrome: Secondary | ICD-10-CM

## 2017-09-06 ENCOUNTER — Telehealth: Payer: Self-pay

## 2017-09-06 DIAGNOSIS — J42 Unspecified chronic bronchitis: Secondary | ICD-10-CM

## 2017-09-06 DIAGNOSIS — J441 Chronic obstructive pulmonary disease with (acute) exacerbation: Secondary | ICD-10-CM

## 2017-09-06 DIAGNOSIS — R946 Abnormal results of thyroid function studies: Secondary | ICD-10-CM | POA: Diagnosis not present

## 2017-09-06 MED ORDER — OXYCODONE-ACETAMINOPHEN 10-325 MG PO TABS: 1.0000 | ORAL_TABLET | Freq: Four times a day (QID) | ORAL | 0 refills | Status: DC | PRN

## 2017-09-06 NOTE — Telephone Encounter (Signed)
Pt is coming in for labs today.  She is requesting to pick up her percocet script then.  I will print and put in your box.

## 2017-09-06 NOTE — Telephone Encounter (Signed)
Perfect

## 2017-09-07 LAB — TSH: TSH: 2.8 mIU/L (ref 0.40–4.50)

## 2017-09-07 NOTE — Progress Notes (Signed)
Stay on same dose and recheck in 3 weeks.

## 2017-09-09 DIAGNOSIS — H6521 Chronic serous otitis media, right ear: Secondary | ICD-10-CM | POA: Diagnosis not present

## 2017-09-09 DIAGNOSIS — Z720 Tobacco use: Secondary | ICD-10-CM | POA: Diagnosis not present

## 2017-09-09 DIAGNOSIS — H90A31 Mixed conductive and sensorineural hearing loss, unilateral, right ear with restricted hearing on the contralateral side: Secondary | ICD-10-CM | POA: Diagnosis not present

## 2017-09-09 DIAGNOSIS — J329 Chronic sinusitis, unspecified: Secondary | ICD-10-CM | POA: Diagnosis not present

## 2017-09-10 DIAGNOSIS — R911 Solitary pulmonary nodule: Secondary | ICD-10-CM | POA: Diagnosis not present

## 2017-09-10 DIAGNOSIS — R69 Illness, unspecified: Secondary | ICD-10-CM | POA: Diagnosis not present

## 2017-09-14 ENCOUNTER — Ambulatory Visit (INDEPENDENT_AMBULATORY_CARE_PROVIDER_SITE_OTHER): Payer: Medicare HMO | Admitting: Physician Assistant

## 2017-09-14 DIAGNOSIS — I482 Chronic atrial fibrillation, unspecified: Secondary | ICD-10-CM

## 2017-09-14 DIAGNOSIS — Z951 Presence of aortocoronary bypass graft: Secondary | ICD-10-CM

## 2017-09-14 DIAGNOSIS — Z952 Presence of prosthetic heart valve: Secondary | ICD-10-CM

## 2017-09-14 LAB — POCT INR: INR: 3.2

## 2017-09-14 NOTE — Progress Notes (Signed)
Stay on same dose and recheck in 2 weeks. Iran Planas PA-C

## 2017-09-28 ENCOUNTER — Ambulatory Visit: Payer: Medicare HMO

## 2017-09-29 ENCOUNTER — Ambulatory Visit (INDEPENDENT_AMBULATORY_CARE_PROVIDER_SITE_OTHER): Payer: Medicare HMO | Admitting: Family Medicine

## 2017-09-29 DIAGNOSIS — Z951 Presence of aortocoronary bypass graft: Secondary | ICD-10-CM

## 2017-09-29 DIAGNOSIS — I482 Chronic atrial fibrillation, unspecified: Secondary | ICD-10-CM

## 2017-09-29 DIAGNOSIS — Z952 Presence of prosthetic heart valve: Secondary | ICD-10-CM

## 2017-09-29 LAB — POCT INR: INR: 2.9

## 2017-09-29 NOTE — Progress Notes (Signed)
Lab Results  Component Value Date   INR 2.9 09/29/2017   INR 3.2 09/14/2017   INR 2.2 08/17/2017    INR at goal. Continue plan.

## 2017-10-07 ENCOUNTER — Other Ambulatory Visit: Payer: Self-pay | Admitting: *Deleted

## 2017-10-07 DIAGNOSIS — J42 Unspecified chronic bronchitis: Secondary | ICD-10-CM

## 2017-10-07 DIAGNOSIS — J441 Chronic obstructive pulmonary disease with (acute) exacerbation: Secondary | ICD-10-CM

## 2017-10-07 MED ORDER — OXYCODONE-ACETAMINOPHEN 10-325 MG PO TABS: 1.0000 | ORAL_TABLET | Freq: Four times a day (QID) | ORAL | 0 refills | Status: DC | PRN

## 2017-10-11 ENCOUNTER — Ambulatory Visit (INDEPENDENT_AMBULATORY_CARE_PROVIDER_SITE_OTHER): Payer: Medicare HMO | Admitting: Physician Assistant

## 2017-10-11 DIAGNOSIS — I482 Chronic atrial fibrillation, unspecified: Secondary | ICD-10-CM

## 2017-10-11 DIAGNOSIS — Z952 Presence of prosthetic heart valve: Secondary | ICD-10-CM

## 2017-10-11 DIAGNOSIS — Z951 Presence of aortocoronary bypass graft: Secondary | ICD-10-CM

## 2017-10-11 LAB — POCT INR: INR: 2.5

## 2017-10-11 NOTE — Progress Notes (Signed)
Stay on same dose of 2mg  daily. Follow up in 2weeks. Waverly Chavarria PA-C

## 2017-10-12 NOTE — Progress Notes (Signed)
Pt advised.

## 2017-10-15 ENCOUNTER — Other Ambulatory Visit: Payer: Self-pay | Admitting: Physician Assistant

## 2017-10-15 DIAGNOSIS — J301 Allergic rhinitis due to pollen: Secondary | ICD-10-CM

## 2017-10-15 DIAGNOSIS — J3489 Other specified disorders of nose and nasal sinuses: Secondary | ICD-10-CM

## 2017-10-15 DIAGNOSIS — R0982 Postnasal drip: Secondary | ICD-10-CM

## 2017-10-21 ENCOUNTER — Telehealth: Payer: Self-pay | Admitting: Physician Assistant

## 2017-10-21 DIAGNOSIS — I482 Chronic atrial fibrillation, unspecified: Secondary | ICD-10-CM

## 2017-10-21 NOTE — Telephone Encounter (Signed)
Ordered lab

## 2017-10-21 NOTE — Telephone Encounter (Signed)
Patient called to do coumadin and needs a lab order sent down. Thanks

## 2017-10-28 DIAGNOSIS — R69 Illness, unspecified: Secondary | ICD-10-CM | POA: Diagnosis not present

## 2017-10-28 DIAGNOSIS — I482 Chronic atrial fibrillation: Secondary | ICD-10-CM | POA: Diagnosis not present

## 2017-10-29 LAB — PROTIME-INR
INR: 1.9 — ABNORMAL HIGH
Prothrombin Time: 20.4 s — ABNORMAL HIGH (ref 9.0–11.5)

## 2017-10-29 NOTE — Telephone Encounter (Signed)
Increase to 2mg  every day except 4mg  on Wednesday. Recheck in 2 weeks.

## 2017-11-11 ENCOUNTER — Ambulatory Visit: Payer: Medicare HMO | Admitting: Physician Assistant

## 2017-11-11 ENCOUNTER — Encounter: Payer: Self-pay | Admitting: Physician Assistant

## 2017-11-11 ENCOUNTER — Ambulatory Visit (INDEPENDENT_AMBULATORY_CARE_PROVIDER_SITE_OTHER): Payer: Medicare HMO

## 2017-11-11 ENCOUNTER — Telehealth: Payer: Self-pay | Admitting: Physician Assistant

## 2017-11-11 VITALS — BP 133/62 | HR 62 | Ht 63.0 in | Wt 182.0 lb

## 2017-11-11 DIAGNOSIS — R062 Wheezing: Secondary | ICD-10-CM | POA: Diagnosis not present

## 2017-11-11 DIAGNOSIS — G8929 Other chronic pain: Secondary | ICD-10-CM | POA: Diagnosis not present

## 2017-11-11 DIAGNOSIS — F17200 Nicotine dependence, unspecified, uncomplicated: Secondary | ICD-10-CM | POA: Diagnosis not present

## 2017-11-11 DIAGNOSIS — R05 Cough: Secondary | ICD-10-CM

## 2017-11-11 DIAGNOSIS — Z952 Presence of prosthetic heart valve: Secondary | ICD-10-CM | POA: Diagnosis not present

## 2017-11-11 DIAGNOSIS — J209 Acute bronchitis, unspecified: Secondary | ICD-10-CM

## 2017-11-11 DIAGNOSIS — H02849 Edema of unspecified eye, unspecified eyelid: Secondary | ICD-10-CM

## 2017-11-11 DIAGNOSIS — I482 Chronic atrial fibrillation, unspecified: Secondary | ICD-10-CM

## 2017-11-11 DIAGNOSIS — M79661 Pain in right lower leg: Secondary | ICD-10-CM

## 2017-11-11 DIAGNOSIS — R059 Cough, unspecified: Secondary | ICD-10-CM

## 2017-11-11 DIAGNOSIS — M79662 Pain in left lower leg: Secondary | ICD-10-CM | POA: Diagnosis not present

## 2017-11-11 DIAGNOSIS — M545 Low back pain: Secondary | ICD-10-CM

## 2017-11-11 DIAGNOSIS — R69 Illness, unspecified: Secondary | ICD-10-CM | POA: Diagnosis not present

## 2017-11-11 LAB — POCT INR: INR: 2.7

## 2017-11-11 MED ORDER — GABAPENTIN 600 MG PO TABS: ORAL_TABLET | ORAL | 1 refills | Status: DC

## 2017-11-11 MED ORDER — OXYCODONE-ACETAMINOPHEN 10-325 MG PO TABS: 1.0000 | ORAL_TABLET | Freq: Four times a day (QID) | ORAL | 0 refills | Status: DC | PRN

## 2017-11-11 NOTE — Progress Notes (Addendum)
Subjective:    Patient ID: Janet Joyce, female    DOB: 1956/10/18, 61 y.o.   MRN: [id number]  HPI Janet Joyce is a 61 y/o female who presents today for INR recheck, leg pain, and bladder problems. She states that both legs began hurting a few months ago and recently got worse. She reports an achy and tingling feeling in bilateral lower extremities from the knee down to her feet. The pain is worse when walking and it feels like she is stepping on pins and needles. She rates the pain as an 8/10 and reports that her daily gabapentin does not provide relief of pain. She reports mild swelling in her legs that is improved with elevation. She denies numbness in her lower extremities.  She also reports wheezing, cough and chest congestion since last week. She states the symptoms are worse with laying down and she has to sleep with 2 pillows under her head and also reports increased fatigue for the last few months. She denies recent illness or sick contacts, SOB with walking, sinus pain or pressure, rhinorrhea, fever, or chills. She has an inhaler that she uses sometimes however feels like it makes her cough worse, she has not used it this past week. She does use her nebulizer and thinks it helps with the cough. She still smokes everyday.  She also complains of urinary frequency and urgency that she has had to wear a pad incase she has leakage. This has increased in the last few weeks. She reports some right sided flank pain, lower abdominal pain/pressure, some nausea in the last week and one episode of vomiting. She denies fever, chills, dysuria, hematuria, or vaginal discharge.  .. Active Ambulatory Problems    Diagnosis Date Noted  . CAD (coronary artery disease) 01/31/2013  . Hyperlipidemia LDL goal <70 01/31/2013  . H/O aortic valve replacement 01/31/2013  . GERD (gastroesophageal reflux disease) 01/31/2013  . S/P CABG (coronary artery bypass graft) 01/31/2013  . Anxiety and depression  01/31/2013  . IBS (irritable bowel syndrome) 01/31/2013  . Status post lung surgery 01/31/2013  . Chronic back pain 01/31/2013  . Tobacco use disorder 01/31/2013  . Bilateral knee pain 01/31/2013  . Hypertriglyceridemia 02/07/2013  . Atrial fibrillation ([place]) 02/20/2013  . Prediabetes 02/21/2013  . Encounter for monitoring Coumadin therapy 03/10/2013  . Urinary incontinence 04/21/2013  . Overactive bladder 04/21/2013  . Lumbago 05/16/2013  . Orthostatic hypotension 07/04/2013  . Essential hypertension, benign 08/04/2013  . Iliotibial band syndrome 11/06/2013  . Solitary pulmonary nodule 09/03/2014  . Acute bronchitis 07/03/2015  . Elevated INR 07/03/2015  . Allergic conjunctivitis 11/01/2015  . Humeral head fracture 11/11/2015  . Decreased diffusion capacity of lung 07/21/2017  . SOB (shortness of breath) 07/28/2017  . Elevated TSH 08/19/2017   Resolved Ambulatory Problems    Diagnosis Date Noted  . Hypokalemia 01/31/2013  . COPD exacerbation ([place]) 08/24/2014  . Emphysema of lung ([place]) 09/03/2014  . Candidal intertrigo 01/28/2017   Past Medical History:  Diagnosis Date  . CHF (congestive heart failure) ([place])   . Depression   . Heart attack ([place])   . Hyperlipidemia      Review of Systems  Constitutional: Positive for fatigue. Negative for chills, diaphoresis and fever.  HENT: Positive for facial swelling (under her eyes). Negative for congestion, rhinorrhea, sinus pressure and sinus pain.   Respiratory: Positive for cough, chest tightness and wheezing. Negative for shortness of breath.   Cardiovascular: Positive for leg swelling.  Gastrointestinal: Positive for  abdominal pain (lower abdominal pain), nausea and vomiting.  Genitourinary: Positive for flank pain, frequency and urgency. Negative for dysuria, hematuria and vaginal discharge.  Musculoskeletal: Positive for myalgias (bilateral lower extremities).  Skin: Negative for color change and rash.  Neurological:  Negative for numbness.      Objective:   Physical Exam  Constitutional: She is oriented to person, place, and time. She appears well-developed and well-nourished.  HENT:  Head: Normocephalic and atraumatic.  Edema present under bilateral eyes.  Cardiovascular: Normal rate, regular rhythm and intact distal pulses.  Mechanical heart valve heard  Pulmonary/Chest: Effort normal and breath sounds normal. She has no wheezes. She has no rhonchi. She has no rales.  Abdominal: Soft. Bowel sounds are normal. She exhibits no mass. There is tenderness in the right lower quadrant and left lower quadrant. There is no rebound, no guarding and no tenderness at McBurney's point.  Lymphadenopathy:    She has no cervical adenopathy.  Neurological: She is alert and oriented to person, place, and time. She has normal reflexes. No sensory deficit.  Skin: Skin is warm, dry and intact.  No LE edema, discoloration, erythema, or bruising.  Psychiatric: She has a normal mood and affect. Her behavior is normal. Thought content normal.   .. Vitals:   11/11/17 1130  BP: 133/62  Pulse: 62  SpO2: 96%      Assessment & Plan:  ... Results for orders placed or performed in visit on 11/11/17  POCT INR  Result Value Ref Range   INR 2.7    .[name] KitchenMarland KitchenDiagnoses and all orders for this visit:  Acute bronchitis, unspecified organism  H/O aortic valve replacement -     POCT INR -     Brain natriuretic peptide  Chronic atrial fibrillation (HCC) -     POCT INR  Cough -     DG Chest 2 View -     Brain natriuretic peptide  Wheezing -     DG Chest 2 View  Eyelid edema, unspecified laterality -     BASIC METABOLIC PANEL WITH GFR -     Brain natriuretic peptide  Pain in both lower legs -     VAS [place] ABI WITH/WO TBI; Future -     NCV with EMG(electromyography); Future -     NCV with EMG(electromyography) -     VAS [place] ABI WITH/WO TBI  Chronic bilateral low back pain without sciatica -     oxyCODONE-acetaminophen  (PERCOCET) 10-325 MG tablet; Take 1-2 tablets by mouth every 6 (six) hours as needed for pain.  Other orders -     gabapentin (NEURONTIN) 600 MG tablet; Two tabs PO TID  Chest Xray CLINICAL DATA:  Productive cough and wheezing for 1 week in a Smoker. EXAM: CHEST  2 VIEW COMPARISON:  PA and lateral chest 02/23/2017 and 07/10/2016. FINDINGS: The patient is status post CABG and aortic valve replacement. Heart size is normal. Mild, chronic peribronchial thickening is identified. No consolidative process, pneumothorax or effusion. No focal bony abnormality. IMPRESSION: No acute disease. Chronic bronchitic change. Electronically Signed By: Inge Rise M.D. On: 11/11/2017 12:32  H/O aortic valve replacement and chronic Afib - INR 2.7 today, continue at same dose of Warfarin and follow-up for recheck in 2 weeks.  Cough, Wheezing, acute bronchitis - CXR to check for heart enlargement, rule out heart failure, and assess for pneumonia. CXR results showed mild chronic bronchitic change. Offered prednisone. Pt declined. Will continue to use duoneb regularly with other symptomatic  care. -reviewed notes from pulmonologist. Pt does not have COPD. She has restrictive pattern.   Eyelid edema, unspecified laterality - BNP to rule out heart failure, BMP with GFR to rule out chronic kidney disease causing edema.  Pain in both lower legs - Patient is experiencing pain and tingling in bilateral lower extremities that is worse with walking and not relieved with her current dose of gabapentin. Discussed with patient that these symptoms could be consistent with either neuropathy or peripheral vascular disease. Refer to neurology for EMG testing and ABI of bilateral lower extremities rule out neuropathy vs peripheral vascular disease. Neuropathy vs claudication of lower extremities. Advised patient that she can increase her gabapentin to 600 mg up to 3 times daily, she can start with 600 mg Gabapentin at night, and  then gradually add 100 mg in the morning for increased relief. Advised patient that the gabapentin can make her tired so she should initiate the 600mg  gabapentin at night to see how she reacts before adding the 100 mg in the morning.  [place] controlled substance database reviewed with no concerns.  Updated pain contract

## 2017-11-11 NOTE — Patient Instructions (Signed)
Increase gabapentin 600mg  at night and then use your 100's to start in morning

## 2017-11-11 NOTE — Telephone Encounter (Signed)
Call pt: she left without leaving a urine sample for test for possible infection. If she is still having symptoms then please drop off urine tomorrow to be ran.

## 2017-11-12 ENCOUNTER — Other Ambulatory Visit: Payer: Self-pay | Admitting: Physician Assistant

## 2017-11-12 ENCOUNTER — Telehealth: Payer: Self-pay | Admitting: Physician Assistant

## 2017-11-12 LAB — BASIC METABOLIC PANEL WITH GFR
BUN: 8 mg/dL (ref 7–25)
CALCIUM: 9.1 mg/dL (ref 8.6–10.4)
CHLORIDE: 103 mmol/L (ref 98–110)
CO2: 25 mmol/L (ref 20–32)
Creat: 0.7 mg/dL (ref 0.50–1.05)
GFR, EST AFRICAN AMERICAN: 110 mL/min/{1.73_m2} (ref >=60)
GFR, Est Non African American: 95 mL/min/{1.73_m2} (ref >=60)
GLUCOSE: 97 mg/dL (ref 65–99)
POTASSIUM: 4.2 mmol/L (ref 3.5–5.3)
Sodium: 137 mmol/L (ref 135–146)

## 2017-11-12 LAB — BRAIN NATRIURETIC PEPTIDE: BRAIN NATRIURETIC PEPTIDE: 131 pg/mL — AB (ref <100)

## 2017-11-12 MED ORDER — FUROSEMIDE 20 MG PO TABS: ORAL_TABLET | ORAL | 0 refills | Status: DC

## 2017-11-12 NOTE — Telephone Encounter (Signed)
Spoke with pt and she said she would be in [place] on Monday and will leave a urine sample then.

## 2017-11-12 NOTE — Telephone Encounter (Signed)
Pt called and stated that the increase in gabapentin gives her a bad headache. She said she can not take that dose and she was going to decrease the medication to what she was on before.

## 2017-11-15 DIAGNOSIS — Z955 Presence of coronary angioplasty implant and graft: Secondary | ICD-10-CM | POA: Diagnosis not present

## 2017-11-15 DIAGNOSIS — Z951 Presence of aortocoronary bypass graft: Secondary | ICD-10-CM | POA: Diagnosis not present

## 2017-11-15 DIAGNOSIS — I519 Heart disease, unspecified: Secondary | ICD-10-CM | POA: Diagnosis not present

## 2017-11-15 DIAGNOSIS — E782 Mixed hyperlipidemia: Secondary | ICD-10-CM | POA: Diagnosis not present

## 2017-11-15 DIAGNOSIS — I517 Cardiomegaly: Secondary | ICD-10-CM | POA: Diagnosis not present

## 2017-11-15 DIAGNOSIS — I2510 Atherosclerotic heart disease of native coronary artery without angina pectoris: Secondary | ICD-10-CM | POA: Diagnosis not present

## 2017-11-15 DIAGNOSIS — I081 Rheumatic disorders of both mitral and tricuspid valves: Secondary | ICD-10-CM | POA: Diagnosis not present

## 2017-11-15 DIAGNOSIS — J449 Chronic obstructive pulmonary disease, unspecified: Secondary | ICD-10-CM | POA: Diagnosis not present

## 2017-11-15 DIAGNOSIS — Z952 Presence of prosthetic heart valve: Secondary | ICD-10-CM | POA: Diagnosis not present

## 2017-11-15 NOTE — Telephone Encounter (Signed)
She could also half most recent pills to be the same amt that she was taking when she took 100mg  3 tablets. FYI

## 2017-11-16 NOTE — Telephone Encounter (Signed)
Called pt to let her know and she said she was confused on what you want her to take. She said she has the 600mg  pills and 100 mg capsules. She wants to know if you still want her to take the 50 mg in the morning and then just cut the 600 in half and take that at night to equal a total of 350mg  in a day. Thanks

## 2017-11-16 NOTE — Telephone Encounter (Signed)
Yes and if she helps she can slowly increase(50mg  every week) until she is taking 300mg  twice a day.

## 2017-11-23 ENCOUNTER — Telehealth: Payer: Self-pay | Admitting: Physician Assistant

## 2017-11-23 MED ORDER — GABAPENTIN 100 MG PO CAPS: 400.0000 mg | ORAL_CAPSULE | Freq: Every day | ORAL | 1 refills | Status: DC

## 2017-11-23 NOTE — Telephone Encounter (Signed)
[facility] for refill of 100mg  4 tablets at bedtime. 90 day supply ok. 1 refill.

## 2017-11-23 NOTE — Telephone Encounter (Signed)
Called Pt, she does need the refill on the 600mg  tabs (which is what was sent in). Pt states she only wants the 100 mg tabs, and that she takes 4 tabs at night. Will route.

## 2017-11-23 NOTE — Telephone Encounter (Signed)
Rx sent 

## 2017-11-23 NOTE — Telephone Encounter (Addendum)
Pt called. She is still waiting for refill on gabapentin 100mg (cannot take the 600 mg).

## 2017-11-26 DIAGNOSIS — M79604 Pain in right leg: Secondary | ICD-10-CM | POA: Diagnosis not present

## 2017-11-26 DIAGNOSIS — M79605 Pain in left leg: Secondary | ICD-10-CM | POA: Diagnosis not present

## 2017-11-30 ENCOUNTER — Encounter: Payer: Self-pay | Admitting: Physician Assistant

## 2017-11-30 DIAGNOSIS — I071 Rheumatic tricuspid insufficiency: Secondary | ICD-10-CM | POA: Insufficient documentation

## 2017-11-30 DIAGNOSIS — I5189 Other ill-defined heart diseases: Secondary | ICD-10-CM | POA: Insufficient documentation

## 2017-11-30 DIAGNOSIS — I519 Heart disease, unspecified: Secondary | ICD-10-CM | POA: Insufficient documentation

## 2017-12-02 DIAGNOSIS — R634 Abnormal weight loss: Secondary | ICD-10-CM | POA: Diagnosis not present

## 2017-12-02 DIAGNOSIS — E531 Pyridoxine deficiency: Secondary | ICD-10-CM | POA: Diagnosis not present

## 2017-12-02 DIAGNOSIS — M542 Cervicalgia: Secondary | ICD-10-CM | POA: Diagnosis not present

## 2017-12-02 DIAGNOSIS — M5417 Radiculopathy, lumbosacral region: Secondary | ICD-10-CM | POA: Diagnosis not present

## 2017-12-02 DIAGNOSIS — G609 Hereditary and idiopathic neuropathy, unspecified: Secondary | ICD-10-CM | POA: Diagnosis not present

## 2017-12-02 DIAGNOSIS — E559 Vitamin D deficiency, unspecified: Secondary | ICD-10-CM | POA: Diagnosis not present

## 2017-12-02 DIAGNOSIS — G5601 Carpal tunnel syndrome, right upper limb: Secondary | ICD-10-CM | POA: Diagnosis not present

## 2017-12-02 DIAGNOSIS — R202 Paresthesia of skin: Secondary | ICD-10-CM | POA: Diagnosis not present

## 2017-12-02 DIAGNOSIS — G6289 Other specified polyneuropathies: Secondary | ICD-10-CM | POA: Diagnosis not present

## 2017-12-02 DIAGNOSIS — Z79899 Other long term (current) drug therapy: Secondary | ICD-10-CM | POA: Diagnosis not present

## 2017-12-02 DIAGNOSIS — E538 Deficiency of other specified B group vitamins: Secondary | ICD-10-CM | POA: Diagnosis not present

## 2017-12-08 ENCOUNTER — Ambulatory Visit (INDEPENDENT_AMBULATORY_CARE_PROVIDER_SITE_OTHER): Payer: Medicare HMO | Admitting: Physician Assistant

## 2017-12-08 VITALS — BP 115/69 | HR 71 | Wt 183.0 lb

## 2017-12-08 DIAGNOSIS — Z952 Presence of prosthetic heart valve: Secondary | ICD-10-CM

## 2017-12-08 DIAGNOSIS — Z951 Presence of aortocoronary bypass graft: Secondary | ICD-10-CM

## 2017-12-08 DIAGNOSIS — R300 Dysuria: Secondary | ICD-10-CM

## 2017-12-08 DIAGNOSIS — G8929 Other chronic pain: Secondary | ICD-10-CM | POA: Diagnosis not present

## 2017-12-08 DIAGNOSIS — I482 Chronic atrial fibrillation, unspecified: Secondary | ICD-10-CM

## 2017-12-08 DIAGNOSIS — M545 Low back pain: Secondary | ICD-10-CM | POA: Diagnosis not present

## 2017-12-08 LAB — POCT INR: INR: 1.6

## 2017-12-08 MED ORDER — OXYCODONE-ACETAMINOPHEN 10-325 MG PO TABS: 1.0000 | ORAL_TABLET | Freq: Four times a day (QID) | ORAL | 0 refills | Status: DC | PRN

## 2017-12-08 NOTE — Progress Notes (Signed)
Patient was in the office for INR check. Patient denied anything abnoraml. INR was 1.6. Patient was advised by PCP to increase Coumadin to 4 mg on Monday and Friday and take 2 mg all other days.. Also urine culture was added for repeat culture. Kameren Baade,CMA  Request pain medication. 11/11/17 last oxycodone refill in database. No concerns. Will send refill. Iran Planas PA-C

## 2017-12-09 LAB — URINE CULTURE
MICRO NUMBER: 81449533
SPECIMEN QUALITY:: ADEQUATE

## 2017-12-10 ENCOUNTER — Other Ambulatory Visit: Payer: Self-pay | Admitting: Physician Assistant

## 2017-12-20 ENCOUNTER — Telehealth: Payer: Self-pay | Admitting: Physician Assistant

## 2017-12-20 NOTE — Telephone Encounter (Signed)
Routing to PCP

## 2017-12-20 NOTE — Telephone Encounter (Signed)
Patient called advising that she needs a doctor's note stating that she is unable to take her trash to the end of the road which is a long way. And with a doctors note the trash company said they would be able to come to her home and pick it up as long as letter states disabled. Pt says she can not drag that heavy trash to the end of the street.Pt request to have letter emailed to the [employer] please see email address below and put pt's address with the email letter so they will know it is her. Thanks   Email to: [email or web address] Janet Joyce [address], [place], [place] [address]

## 2017-12-21 NOTE — Telephone Encounter (Signed)
Call patient and let patient know that letter written. Please send letter to company.

## 2017-12-22 ENCOUNTER — Ambulatory Visit: Payer: Medicare HMO

## 2017-12-22 ENCOUNTER — Ambulatory Visit (INDEPENDENT_AMBULATORY_CARE_PROVIDER_SITE_OTHER): Payer: Medicare HMO | Admitting: Physician Assistant

## 2017-12-22 ENCOUNTER — Encounter: Payer: Self-pay | Admitting: Physician Assistant

## 2017-12-22 VITALS — BP 113/66 | HR 62 | Temp 97.8°F | Ht 63.0 in | Wt 180.0 lb

## 2017-12-22 DIAGNOSIS — I482 Chronic atrial fibrillation, unspecified: Secondary | ICD-10-CM

## 2017-12-22 DIAGNOSIS — J029 Acute pharyngitis, unspecified: Secondary | ICD-10-CM | POA: Diagnosis not present

## 2017-12-22 DIAGNOSIS — Z952 Presence of prosthetic heart valve: Secondary | ICD-10-CM

## 2017-12-22 DIAGNOSIS — J069 Acute upper respiratory infection, unspecified: Secondary | ICD-10-CM

## 2017-12-22 DIAGNOSIS — Z951 Presence of aortocoronary bypass graft: Secondary | ICD-10-CM

## 2017-12-22 DIAGNOSIS — J0110 Acute frontal sinusitis, unspecified: Secondary | ICD-10-CM

## 2017-12-22 LAB — POCT RAPID STREP A (OFFICE): RAPID STREP A SCREEN: NEGATIVE

## 2017-12-22 LAB — POCT INR: INR: 2.9

## 2017-12-22 MED ORDER — AZITHROMYCIN 250 MG PO TABS
ORAL_TABLET | ORAL | 0 refills | Status: DC
Start: 2017-12-22 — End: 2018-01-04

## 2017-12-22 MED ORDER — DICLOFENAC SODIUM 1 % TD GEL: 4.0000 g | Freq: Four times a day (QID) | TRANSDERMAL | 11 refills | Status: DC

## 2017-12-22 MED ORDER — METHYLPREDNISOLONE 4 MG PO TBPK: ORAL_TABLET | ORAL | 0 refills | Status: DC

## 2017-12-22 NOTE — Progress Notes (Signed)
poct

## 2017-12-22 NOTE — Progress Notes (Signed)
Subjective:    Patient ID: Janet Joyce, female    DOB: 24-Feb-1956, 62 y.o.   MRN: [id number]  HPI  Pt is a 62 yo female with Chronic a.fib, S/P CABG who presents to the clinic for INR check for coumadin. She is doing well on current dose 4mg  mon/friday and 2mg  rest of week.   She is having a ST for last 3 days. Fever of 100 on Sunday and Monday. She has a lot of sinus pressure. Not taking any OTC medications due to her poly pharmacy. No body aches, nausea, vomiting.   Pt has seen neurologist and they want to do injections in back. She wants me to look at report and explain in to her.   .. Active Ambulatory Problems    Diagnosis Date Noted  . CAD (coronary artery disease) 01/31/2013  . Hyperlipidemia LDL goal <70 01/31/2013  . H/O aortic valve replacement 01/31/2013  . GERD (gastroesophageal reflux disease) 01/31/2013  . S/P CABG (coronary artery bypass graft) 01/31/2013  . Anxiety and depression 01/31/2013  . IBS (irritable bowel syndrome) 01/31/2013  . Status post lung surgery 01/31/2013  . Chronic back pain 01/31/2013  . Tobacco use disorder 01/31/2013  . Bilateral knee pain 01/31/2013  . Hypertriglyceridemia 02/07/2013  . Atrial fibrillation ([place]) 02/20/2013  . Prediabetes 02/21/2013  . Encounter for monitoring Coumadin therapy 03/10/2013  . Urinary incontinence 04/21/2013  . Overactive bladder 04/21/2013  . Lumbago 05/16/2013  . Orthostatic hypotension 07/04/2013  . Essential hypertension, benign 08/04/2013  . Iliotibial band syndrome 11/06/2013  . Solitary pulmonary nodule 09/03/2014  . Acute bronchitis 07/03/2015  . Elevated INR 07/03/2015  . Allergic conjunctivitis 11/01/2015  . Humeral head fracture 11/11/2015  . Decreased diffusion capacity of lung 07/21/2017  . SOB (shortness of breath) 07/28/2017  . Elevated TSH 08/19/2017  . Tricuspid regurgitation 11/30/2017  . Grade II diastolic dysfunction 62/70/3500   Resolved Ambulatory Problems    Diagnosis Date  Noted  . Hypokalemia 01/31/2013  . COPD exacerbation ([place]) 08/24/2014  . Emphysema of lung ([place]) 09/03/2014  . Candidal intertrigo 01/28/2017   Past Medical History:  Diagnosis Date  . CHF (congestive heart failure) ([place])   . Depression   . Heart attack ([place])   . Hyperlipidemia       Review of Systems  All other systems reviewed and are negative.      Objective:   Physical Exam  Constitutional: She is oriented to person, place, and time. She appears well-developed and well-nourished.  Obese.   HENT:  Head: Normocephalic and atraumatic.  Right Ear: External ear normal.  Left Ear: External ear normal.  Nose: Nose normal.  Mouth/Throat: Oropharynx is clear and moist. No oropharyngeal exudate.  TM"s clear bilaterally.  Oropharynx erythematous. No exudate.  Tenderness over frontal sinuses.  Nasal turbinates red and swollen.   Eyes: Conjunctivae are normal. Right eye exhibits no discharge. Left eye exhibits no discharge.  Neck: Normal range of motion. Neck supple.  Cardiovascular: Normal rate and regular rhythm.  Mechanical valve.   Pulmonary/Chest: Effort normal and breath sounds normal. She has no wheezes.  Lymphadenopathy:    She has cervical adenopathy.  Neurological: She is alert and oriented to person, place, and time.  Psychiatric: She has a normal mood and affect. Her behavior is normal.          Assessment & Plan:  [name] KitchenMarland KitchenBrentney was seen today for cough, sore throat, headache and atrial fibrillation.  Diagnoses and all orders for this visit:  Chronic atrial fibrillation (HCC) -     POCT INR  S/P CABG (coronary artery bypass graft) -     POCT INR  H/O aortic valve replacement -     POCT INR  Sore throat -     methylPREDNISolone (MEDROL DOSEPAK) 4 MG TBPK tablet; Take as directed by package insert -     POCT rapid strep A  Viral URI -     methylPREDNISolone (MEDROL DOSEPAK) 4 MG TBPK tablet; Take as directed by package insert  Acute non-recurrent  frontal sinusitis -     azithromycin (ZITHROMAX) 250 MG tablet; Take 2 tablets now and then one tablet for 4 days. -     methylPREDNISolone (MEDROL DOSEPAK) 4 MG TBPK tablet; Take as directed by package insert  Other orders -     diclofenac sodium (VOLTAREN) 1 % GEL; Apply 4 g topically 4 (four) times daily. To affected joint.   Rapid strep negative.   Discussed likely viral. Discussed symptomatic care. zpak given if symptoms persist in the next 3 to 5 days.   Dr. Derrill Center notes.  .. Results for orders placed or performed in visit on 12/22/17  POCT INR  Result Value Ref Range   INR 2.9    Stay on same dose 4mg  on Friday and Monday. 2mg  every other day. If start zpak need to be on 2mg  every day due to increased bleeding risk. Follow up for INR recheck in 2 weeks.

## 2017-12-22 NOTE — Patient Instructions (Signed)

## 2017-12-30 ENCOUNTER — Telehealth: Payer: Self-pay | Admitting: Physician Assistant

## 2017-12-30 ENCOUNTER — Telehealth: Payer: Self-pay

## 2017-12-30 DIAGNOSIS — G6289 Other specified polyneuropathies: Secondary | ICD-10-CM | POA: Diagnosis not present

## 2017-12-30 DIAGNOSIS — M545 Low back pain: Secondary | ICD-10-CM | POA: Diagnosis not present

## 2017-12-30 DIAGNOSIS — M542 Cervicalgia: Secondary | ICD-10-CM | POA: Diagnosis not present

## 2017-12-30 DIAGNOSIS — G5601 Carpal tunnel syndrome, right upper limb: Secondary | ICD-10-CM | POA: Diagnosis not present

## 2017-12-30 NOTE — Telephone Encounter (Signed)
Ok for doxycycline 100mg  bid #10 NRF.

## 2017-12-30 NOTE — Telephone Encounter (Signed)
Patient called in requesting a different antibiotic be called into [facility] due to not being able to take Azithromycin. Patient states she has nausea and vomiting when trying to take the antibiotic. Patient denies shortness of breath, chest pain, hives, rash or itching.  Advised patient to discontinue taking antibiotic and I would forward message to her PCP for review. She will receive a call from our office once a decision has been made. Patient stated she understood.

## 2017-12-30 NOTE — Telephone Encounter (Signed)
Patient called lvm about antibiotic adv message was sent to pcp but does not work on Thursday's. Adv pt her pcp called in doxycyline. You do not have to call pt back. Thanks

## 2018-01-04 ENCOUNTER — Ambulatory Visit (INDEPENDENT_AMBULATORY_CARE_PROVIDER_SITE_OTHER): Payer: Medicare HMO | Admitting: Physician Assistant

## 2018-01-04 ENCOUNTER — Encounter: Payer: Self-pay | Admitting: Physician Assistant

## 2018-01-04 VITALS — BP 142/59 | HR 70 | Temp 97.6°F | Ht 63.0 in | Wt 175.0 lb

## 2018-01-04 DIAGNOSIS — R1084 Generalized abdominal pain: Secondary | ICD-10-CM

## 2018-01-04 DIAGNOSIS — I482 Chronic atrial fibrillation, unspecified: Secondary | ICD-10-CM

## 2018-01-04 DIAGNOSIS — R112 Nausea with vomiting, unspecified: Secondary | ICD-10-CM

## 2018-01-04 DIAGNOSIS — Z952 Presence of prosthetic heart valve: Secondary | ICD-10-CM | POA: Diagnosis not present

## 2018-01-04 DIAGNOSIS — Z951 Presence of aortocoronary bypass graft: Secondary | ICD-10-CM | POA: Diagnosis not present

## 2018-01-04 DIAGNOSIS — J0191 Acute recurrent sinusitis, unspecified: Secondary | ICD-10-CM

## 2018-01-04 LAB — POCT INR: INR: 3.3

## 2018-01-04 MED ORDER — PROMETHAZINE HCL 25 MG RE SUPP: 25.0000 mg | Freq: Four times a day (QID) | RECTAL | 0 refills | Status: DC | PRN

## 2018-01-04 MED ORDER — CEFTRIAXONE SODIUM 1 G IJ SOLR
1.0000 g | Freq: Once | INTRAMUSCULAR | Status: AC
  Administered 2018-01-04: 1 g via INTRAMUSCULAR

## 2018-01-04 MED ORDER — AMOXICILLIN-POT CLAVULANATE 875-125 MG PO TABS: 1.0000 | ORAL_TABLET | Freq: Two times a day (BID) | ORAL | 0 refills | Status: DC

## 2018-01-04 NOTE — Progress Notes (Addendum)
Subjective:    Patient ID: Janet Joyce, female    DOB: [birth year], 62 y.o.   MRN: [id number]  HPI Pt is a 62 year old female with a past history of atrial fibrillation, CAD, HTN, s/p CABG, and s/p Aortic valve replacement. She reports to the clinic today to re-check her INR and discuss medications.   She came in to the clinic on 12/22/17 for a sore throat that started on 12/19/17 and INR Check. She had a fever of 100 at the time and sinus pressure. She was given azithromycin and called on 12/30/17  Adverse side effects with nausea and vomiting after taking the pill. We called in Doxycycline and she still couldn't tolerate the antibiotic. She took only one tablet for doxycycline. She has had this nausea intermittently and has felt very ill. Her symptoms include sinus pain and pressure, night sweats, nausea, vomiting, "feels hot" ,  chest discomfort with coughing, abdominal pain,  and chills. She has been taking tylenol for her pain but it has not been beneficial. She has decreased her cigarette use since becoming ill. She also reports she has lost 5 pounds since this illness started. According to her allergies and adverse effect records , she can only tolerate amoxicillin at this time.   Her last INR was 2.9    . Her INR goal is 2.5-3.5   Review of Systems  Constitutional: Positive for chills, diaphoresis, fatigue and fever. Negative for appetite change.  HENT: Positive for congestion, postnasal drip, sinus pressure and sinus pain. Negative for sore throat.   Respiratory: Positive for cough, chest tightness, shortness of breath and wheezing.   Musculoskeletal: Positive for myalgias. Negative for arthralgias.  Neurological: Positive for headaches.   Past Medical History:  Diagnosis Date  . CHF (congestive heart failure) ([place])   . Depression   . Heart attack ([place])   . Hyperlipidemia         Objective:   Physical Exam  Constitutional: She is oriented to person, place, and time. She  appears well-developed and well-nourished. No distress.  HENT:  Head: Normocephalic and atraumatic.  Right Ear: External ear normal.  Left Ear: External ear normal.  Tenderness over maxillary and frontal sinuses to palpation.   Neck: Normal range of motion. Neck supple.  Cardiovascular: Normal rate, regular rhythm and normal heart sounds. Exam reveals no gallop and no friction rub.  No murmur heard. Pulmonary/Chest: Effort normal and breath sounds normal. She has no wheezes.  Abdominal: Bowel sounds are normal. There is generalized tenderness. There is guarding.  Tenderness more severe in the right upper and lower quadrants.   Lymphadenopathy:    She has no cervical adenopathy.  Neurological: She is alert and oriented to person, place, and time.  Psychiatric: She has a normal mood and affect. Her behavior is normal.   Vitals:   01/04/18 1410  BP: (!) 142/59  Pulse: 70  Temp: 97.6 F (36.4 C)  SpO2: 96%   POCT INR: 3.3     Assessment & Plan:   Diagnoses and all orders for this visit:  Generalized abdominal pain -     CT Abdomen Pelvis W Contrast -     amoxicillin-clavulanate (AUGMENTIN) 875-125 MG tablet; Take 1 tablet by mouth 2 (two) times daily. For 10 days. -     CBC with Differential/Platelet -     COMPLETE METABOLIC PANEL WITH GFR -     Lipase  H/O aortic valve replacement -     POCT INR  S/P CABG (coronary artery bypass graft) -     POCT INR  Chronic atrial fibrillation (HCC) -     POCT INR  Non-intractable vomiting with nausea, unspecified vomiting type -     CT Abdomen Pelvis W Contrast -     promethazine (PHENERGAN) 25 MG suppository; Place 1 suppository (25 mg total) rectally every 6 (six) hours as needed for nausea or vomiting. -     CBC with Differential/Platelet -     COMPLETE METABOLIC PANEL WITH GFR -     Lipase  Acute recurrent sinusitis, unspecified location -     cefTRIAXone (ROCEPHIN) injection 1 g   Janet Joyce was asked if she was able to  get a contrast CT today. Pt reported that she is not ready to drive to [place] to get imaging today. She needs to get a CT of abdomen Pelvis with contrast in order to rule out acute diverticulitis or other acute abdomen. She has been given pherergan suppository for nausea and Augmentin empirically for sinusitis and to cover possible diverticulitis. She has been given a Rocephin injection today since she cannot tolerate food by mouth and will  She should start her augmentin tomorrow and get her CT scan. She will also get labs today for evaluation of infection, anemia,  And electrolyte abnormalities. We will follow up with a plan after we get the results of her CT scan. If her symptoms get worse she can message me on myChart or call the office.   Follow up in 2 weeks for INR recheck.

## 2018-01-04 NOTE — Patient Instructions (Signed)
2 week INR recheck.  Start augmentin tomorrow.  Phenergan as needed.  CT scan for abdominal pain.

## 2018-01-05 LAB — CBC WITH DIFFERENTIAL/PLATELET
Basophils Absolute: 38 cells/uL (ref 0–200)
Basophils Relative: 0.3 %
Eosinophils Absolute: 63 cells/uL (ref 15–500)
Eosinophils Relative: 0.5 %
HEMATOCRIT: 46.2 % — AB (ref 35.0–45.0)
Hemoglobin: 15.1 g/dL (ref 11.7–15.5)
LYMPHS ABS: 4133 {cells}/uL — AB (ref 850–3900)
MCH: 27 pg (ref 27.0–33.0)
MCHC: 32.7 g/dL (ref 32.0–36.0)
MCV: 82.5 fL (ref 80.0–100.0)
MPV: 11.4 fL (ref 7.5–12.5)
Monocytes Relative: 7.7 %
NEUTROS PCT: 58.7 %
Neutro Abs: 7396 cells/uL (ref 1500–7800)
Platelets: 286 10*3/uL (ref 140–400)
RBC: 5.6 10*6/uL — AB (ref 3.80–5.10)
RDW: 14.5 % (ref 11.0–15.0)
Total Lymphocyte: 32.8 %
WBC: 12.6 10*3/uL — AB (ref 3.8–10.8)
WBCMIX: 970 {cells}/uL — AB (ref 200–950)

## 2018-01-05 LAB — COMPLETE METABOLIC PANEL WITH GFR
AG RATIO: 1.6 (calc) (ref 1.0–2.5)
ALT: 50 U/L — AB (ref 6–29)
AST: 41 U/L — AB (ref 10–35)
Albumin: 4.4 g/dL (ref 3.6–5.1)
Alkaline phosphatase (APISO): 100 U/L (ref 33–130)
BUN: 16 mg/dL (ref 7–25)
CALCIUM: 10 mg/dL (ref 8.6–10.4)
CO2: 27 mmol/L (ref 20–32)
CREATININE: 0.96 mg/dL (ref 0.50–1.05)
Chloride: 102 mmol/L (ref 98–110)
GFR, EST NON AFRICAN AMERICAN: 65 mL/min/{1.73_m2} (ref >=60)
GFR, Est African American: 75 mL/min/{1.73_m2} (ref >=60)
GLOBULIN: 2.8 g/dL (ref 1.9–3.7)
Glucose, Bld: 122 mg/dL — ABNORMAL HIGH (ref 65–99)
POTASSIUM: 3.9 mmol/L (ref 3.5–5.3)
SODIUM: 138 mmol/L (ref 135–146)
TOTAL PROTEIN: 7.2 g/dL (ref 6.1–8.1)
Total Bilirubin: 0.5 mg/dL (ref 0.2–1.2)

## 2018-01-05 LAB — LIPASE: Lipase: 34 U/L (ref 7–60)

## 2018-01-06 ENCOUNTER — Other Ambulatory Visit: Payer: Self-pay | Admitting: Physician Assistant

## 2018-01-07 ENCOUNTER — Encounter: Payer: Self-pay | Admitting: Physician Assistant

## 2018-01-07 ENCOUNTER — Other Ambulatory Visit: Payer: Self-pay

## 2018-01-07 ENCOUNTER — Ambulatory Visit (HOSPITAL_BASED_OUTPATIENT_CLINIC_OR_DEPARTMENT_OTHER)
Admission: RE | Admit: 2018-01-07 | Discharge: 2018-01-07 | Disposition: A | Discharge status: Home / Self Care | Payer: Medicare HMO | Source: Ambulatory Visit | Attending: Physician Assistant | Admitting: Physician Assistant

## 2018-01-07 ENCOUNTER — Other Ambulatory Visit: Payer: Self-pay | Admitting: Physician Assistant

## 2018-01-07 DIAGNOSIS — R112 Nausea with vomiting, unspecified: Secondary | ICD-10-CM | POA: Diagnosis present

## 2018-01-07 DIAGNOSIS — Z9049 Acquired absence of other specified parts of digestive tract: Secondary | ICD-10-CM | POA: Insufficient documentation

## 2018-01-07 DIAGNOSIS — I700 Atherosclerosis of aorta: Secondary | ICD-10-CM | POA: Insufficient documentation

## 2018-01-07 DIAGNOSIS — M545 Low back pain: Principal | ICD-10-CM

## 2018-01-07 DIAGNOSIS — I4891 Unspecified atrial fibrillation: Secondary | ICD-10-CM | POA: Diagnosis not present

## 2018-01-07 DIAGNOSIS — R1084 Generalized abdominal pain: Secondary | ICD-10-CM | POA: Diagnosis present

## 2018-01-07 DIAGNOSIS — K862 Cyst of pancreas: Secondary | ICD-10-CM | POA: Insufficient documentation

## 2018-01-07 DIAGNOSIS — I2510 Atherosclerotic heart disease of native coronary artery without angina pectoris: Secondary | ICD-10-CM | POA: Diagnosis not present

## 2018-01-07 DIAGNOSIS — R109 Unspecified abdominal pain: Secondary | ICD-10-CM | POA: Diagnosis not present

## 2018-01-07 DIAGNOSIS — G8929 Other chronic pain: Secondary | ICD-10-CM

## 2018-01-07 LAB — PROTIME-INR
INR: 2.3 — ABNORMAL HIGH
Prothrombin Time: 24.4 s — ABNORMAL HIGH (ref 9.0–11.5)

## 2018-01-07 MED ORDER — IOPAMIDOL (ISOVUE-300) INJECTION 61%
100.0000 mL | Freq: Once | INTRAVENOUS | Status: AC | PRN
  Administered 2018-01-07: 100 mL via INTRAVENOUS

## 2018-01-07 MED ORDER — OXYCODONE-ACETAMINOPHEN 10-325 MG PO TABS: 1.0000 | ORAL_TABLET | Freq: Four times a day (QID) | ORAL | 0 refills | Status: DC | PRN

## 2018-01-07 NOTE — Progress Notes (Signed)
Pt called in for oxycodone refill. Sent today.

## 2018-01-14 ENCOUNTER — Ambulatory Visit (INDEPENDENT_AMBULATORY_CARE_PROVIDER_SITE_OTHER): Payer: Medicare HMO | Admitting: Family Medicine

## 2018-01-14 DIAGNOSIS — I4891 Unspecified atrial fibrillation: Secondary | ICD-10-CM

## 2018-01-14 DIAGNOSIS — Z951 Presence of aortocoronary bypass graft: Secondary | ICD-10-CM

## 2018-01-14 DIAGNOSIS — Z952 Presence of prosthetic heart valve: Secondary | ICD-10-CM

## 2018-01-14 LAB — POCT INR: INR: 1.4

## 2018-01-14 NOTE — Progress Notes (Signed)
Patient advised of recommendations.  

## 2018-01-22 ENCOUNTER — Other Ambulatory Visit: Payer: Self-pay | Admitting: Physician Assistant

## 2018-01-26 ENCOUNTER — Ambulatory Visit (INDEPENDENT_AMBULATORY_CARE_PROVIDER_SITE_OTHER): Payer: Medicare HMO | Admitting: Physician Assistant

## 2018-01-26 ENCOUNTER — Encounter: Payer: Self-pay | Admitting: Physician Assistant

## 2018-01-26 VITALS — BP 124/63 | HR 72 | Ht 63.0 in | Wt 174.0 lb

## 2018-01-26 DIAGNOSIS — Z952 Presence of prosthetic heart valve: Secondary | ICD-10-CM | POA: Diagnosis not present

## 2018-01-26 DIAGNOSIS — L600 Ingrowing nail: Secondary | ICD-10-CM

## 2018-01-26 DIAGNOSIS — B351 Tinea unguium: Secondary | ICD-10-CM

## 2018-01-26 DIAGNOSIS — Z951 Presence of aortocoronary bypass graft: Secondary | ICD-10-CM | POA: Diagnosis not present

## 2018-01-26 DIAGNOSIS — I4891 Unspecified atrial fibrillation: Secondary | ICD-10-CM | POA: Diagnosis not present

## 2018-01-26 LAB — POCT INR: INR: 3.9

## 2018-01-26 MED ORDER — TERBINAFINE HCL 250 MG PO TABS: 250.0000 mg | ORAL_TABLET | Freq: Every day | ORAL | 2 refills | Status: DC

## 2018-01-26 NOTE — Progress Notes (Signed)
Subjective:    Patient ID: Janet Joyce, female    DOB: [birth year], 62 y.o.   MRN: [id number]  HPI  Pt is a 62 yo female with HTN, CAD, mechanical heart valve, a.fib, CABG who presents to the clinic for INR recheck and to discuss painful ingrown toenail.   Pt has been off abx for at least a week. She reports to be taking 2mg  daily.   Over the past 4 weeks her left ingrown toenail is painful and tender to touch. No redness, pus, warmth. It is very tender to touch.   .. Active Ambulatory Problems    Diagnosis Date Noted  . CAD (coronary artery disease) 01/31/2013  . Hyperlipidemia LDL goal <70 01/31/2013  . H/O aortic valve replacement 01/31/2013  . GERD (gastroesophageal reflux disease) 01/31/2013  . S/P CABG (coronary artery bypass graft) 01/31/2013  . Anxiety and depression 01/31/2013  . IBS (irritable bowel syndrome) 01/31/2013  . Status post lung surgery 01/31/2013  . Chronic back pain 01/31/2013  . Tobacco use disorder 01/31/2013  . Bilateral knee pain 01/31/2013  . Hypertriglyceridemia 02/07/2013  . Atrial fibrillation ([place]) 02/20/2013  . Prediabetes 02/21/2013  . Encounter for monitoring Coumadin therapy 03/10/2013  . Urinary incontinence 04/21/2013  . Overactive bladder 04/21/2013  . Lumbago 05/16/2013  . Orthostatic hypotension 07/04/2013  . Essential hypertension, benign 08/04/2013  . Iliotibial band syndrome 11/06/2013  . Solitary pulmonary nodule 09/03/2014  . Acute bronchitis 07/03/2015  . Elevated INR 07/03/2015  . Allergic conjunctivitis 11/01/2015  . Humeral head fracture 11/11/2015  . Decreased diffusion capacity of lung 07/21/2017  . SOB (shortness of breath) 07/28/2017  . Elevated TSH 08/19/2017  . Tricuspid regurgitation 11/30/2017  . Grade II diastolic dysfunction 42/68/3419  . Pancreatic cyst 01/07/2018  . Fungal toenail infection 01/30/2018  . Ingrown toenail of left foot 01/30/2018   Resolved Ambulatory Problems    Diagnosis Date Noted  .  Hypokalemia 01/31/2013  . COPD exacerbation ([place]) 08/24/2014  . Emphysema of lung ([place]) 09/03/2014  . Candidal intertrigo 01/28/2017   Past Medical History:  Diagnosis Date  . CHF (congestive heart failure) ([place])   . Depression   . Heart attack ([place])   . Hyperlipidemia       Review of Systems  All other systems reviewed and are negative.      Objective:   Physical Exam  Constitutional: She is oriented to person, place, and time. She appears well-developed and well-nourished.  HENT:  Head: Normocephalic and atraumatic.  Neck: Normal range of motion. Neck supple.  Cardiovascular: Normal rate, regular rhythm and normal heart sounds.  Pulmonary/Chest: Effort normal and breath sounds normal. She has no wheezes.  Lymphadenopathy:    She has no cervical adenopathy.  Neurological: She is alert and oriented to person, place, and time.  Skin: Skin is dry.  Left medial nail tender to palpation. No blood or pus.no warmth. Ingrown toenail seen.  Multiple toenails yellow and thick that appear to be crackling at the tips.   Psychiatric: She has a normal mood and affect. Her behavior is normal.          Assessment & Plan:  [name] KitchenMarland KitchenDiagnoses and all orders for this visit:  Ingrown toenail of left foot  H/O aortic valve replacement -     POCT INR  S/P CABG (coronary artery bypass graft) -     POCT INR  Atrial fibrillation, unspecified type (HCC) -     POCT INR  Fungal toenail infection -  terbinafine (LAMISIL) 250 MG tablet; Take 1 tablet (250 mg total) by mouth daily. For 12 weeks.   .. Results for orders placed or performed in visit on 01/26/18  POCT INR  Result Value Ref Range   INR 3.9    INR too elevated. Due to starting lamisil due to fungal nails.will keep on same dose since lamisil can decreased coumadin effectiveness. Recheck in 2 weeks. Kidney function looks good currently. Recheck in 6 weeks.   Spent some time debriding area where it is ingrown. Discussed  epson water soaks and not wearing tight shoes. Discussed if I remove she will need to stop coumadin 2 days before. She will then restart after procedure is done. Let's see how nail is in 2 weeks.   [name] Kitchen.Spent 30 minutes with patient and greater than 50 percent of visit spent counseling patient regarding treatment plan.

## 2018-01-26 NOTE — Patient Instructions (Signed)
Ingrown Toenail An ingrown toenail occurs when the corner or sides of your toenail grow into the surrounding skin. The big toe is most commonly affected, but it can happen to any of your toes. If your ingrown toenail is not treated, you will be at risk for infection. What are the causes? This condition may be caused by:  Wearing shoes that are too small or tight.  Injury or trauma, such as stubbing your toe or having your toe stepped on.  Improper cutting or care of your toenails.  Being born with (congenital) nail or foot abnormalities, such as having a nail that is too big for your toe.  What increases the risk? Risk factors for an ingrown toenail include:  Age. Your nails tend to thicken as you get older, so ingrown nails are more common in older people.  Diabetes.  Cutting your toenails incorrectly.  Blood circulation problems.  What are the signs or symptoms? Symptoms may include:  Pain, soreness, or tenderness.  Redness.  Swelling.  Hardening of the skin surrounding the toe.  Your ingrown toenail may be infected if there is fluid, pus, or drainage. How is this diagnosed? An ingrown toenail may be diagnosed by medical history and physical exam. If your toenail is infected, your health care provider may test a sample of the drainage. How is this treated? Treatment depends on the severity of your ingrown toenail. Some ingrown toenails may be treated at home. More severe or infected ingrown toenails may require surgery to remove all or part of the nail. Infected ingrown toenails may also be treated with antibiotic medicines. Follow these instructions at home:  If you were prescribed an antibiotic medicine, finish all of it even if you start to feel better.  Soak your foot in warm soapy water for 20 minutes, 3 times per day or as directed by your health care provider.  Carefully lift the edge of the nail away from the sore skin by wedging a small piece of cotton under  the corner of the nail. This may help with the pain. Be careful not to cause more injury to the area.  Wear shoes that fit well. If your ingrown toenail is causing you pain, try wearing sandals, if possible.  Trim your toenails regularly and carefully. Do not cut them in a curved shape. Cut your toenails straight across. This prevents injury to the skin at the corners of the toenail.  Keep your feet clean and dry.  If you are having trouble walking and are given crutches by your health care provider, use them as directed.  Do not pick at your toenail or try to remove it yourself.  Take medicines only as directed by your health care provider.  Keep all follow-up visits as directed by your health care provider. This is important. Contact a health care provider if:  Your symptoms do not improve with treatment. Get help right away if:  You have red streaks that start at your foot and go up your leg.  You have a fever.  You have increased redness, swelling, or pain.  You have fluid, blood, or pus coming from your toenail. This information is not intended to replace advice given to you by your health care provider. Make sure you discuss any questions you have with your health care provider. Document Released: 11/27/2000 Document Revised: 05/01/2016 Document Reviewed: 10/24/2014 Elsevier Interactive Patient Education  2018 [employer] Inc[employer]  

## 2018-01-27 DIAGNOSIS — R69 Illness, unspecified: Secondary | ICD-10-CM | POA: Diagnosis not present

## 2018-01-30 ENCOUNTER — Encounter: Payer: Self-pay | Admitting: Physician Assistant

## 2018-01-30 DIAGNOSIS — B351 Tinea unguium: Secondary | ICD-10-CM | POA: Insufficient documentation

## 2018-01-30 DIAGNOSIS — L600 Ingrowing nail: Secondary | ICD-10-CM | POA: Insufficient documentation

## 2018-02-02 ENCOUNTER — Ambulatory Visit: Payer: Medicare HMO

## 2018-02-02 ENCOUNTER — Ambulatory Visit (INDEPENDENT_AMBULATORY_CARE_PROVIDER_SITE_OTHER): Payer: Medicare HMO | Admitting: Physician Assistant

## 2018-02-02 VITALS — BP 124/78 | HR 65

## 2018-02-02 DIAGNOSIS — R7989 Other specified abnormal findings of blood chemistry: Secondary | ICD-10-CM | POA: Diagnosis not present

## 2018-02-02 DIAGNOSIS — Z951 Presence of aortocoronary bypass graft: Secondary | ICD-10-CM

## 2018-02-02 DIAGNOSIS — Z1321 Encounter for screening for nutritional disorder: Secondary | ICD-10-CM

## 2018-02-02 DIAGNOSIS — Z952 Presence of prosthetic heart valve: Secondary | ICD-10-CM

## 2018-02-02 DIAGNOSIS — R112 Nausea with vomiting, unspecified: Secondary | ICD-10-CM

## 2018-02-02 DIAGNOSIS — I4891 Unspecified atrial fibrillation: Secondary | ICD-10-CM

## 2018-02-02 DIAGNOSIS — R5383 Other fatigue: Secondary | ICD-10-CM | POA: Diagnosis not present

## 2018-02-02 LAB — POCT INR: INR: >4.5

## 2018-02-02 LAB — PROTIME-INR
INR: 4.7 — ABNORMAL HIGH
Prothrombin Time: 49.8 s — ABNORMAL HIGH (ref 9.0–11.5)

## 2018-02-02 MED ORDER — PROMETHAZINE HCL 25 MG PO TABS: 25.0000 mg | ORAL_TABLET | Freq: Three times a day (TID) | ORAL | 0 refills | Status: DC | PRN

## 2018-02-02 NOTE — Progress Notes (Signed)
Pt advised. Verbalized understanding. She will cut her current tabs in half. Advised Pt to bring her Rx bottle with her to that appt for review. She will call to schedule.

## 2018-02-02 NOTE — Progress Notes (Signed)
Hold next dose. Decrease dose to 2mg  then 1mg  daily alternating. Recheck in 2 weeks. May need to send 1mg  tablets.

## 2018-02-02 NOTE — Patient Instructions (Signed)
Hold next dose. Decrease dose to 2mg  then 1mg  daily alternating. Recheck in 2 weeks.

## 2018-02-03 LAB — VITAMIN B12: Vitamin B-12: 658 pg/mL (ref 200–1100)

## 2018-02-04 NOTE — Progress Notes (Signed)
Call pt: Janet Joyce is wonderful!

## 2018-02-08 ENCOUNTER — Telehealth: Payer: Self-pay | Admitting: Physician Assistant

## 2018-02-08 ENCOUNTER — Other Ambulatory Visit: Payer: Self-pay | Admitting: Physician Assistant

## 2018-02-08 DIAGNOSIS — M545 Low back pain, unspecified: Secondary | ICD-10-CM

## 2018-02-08 DIAGNOSIS — G8929 Other chronic pain: Secondary | ICD-10-CM

## 2018-02-08 MED ORDER — OXYCODONE-ACETAMINOPHEN 10-325 MG PO TABS: 1.0000 | ORAL_TABLET | Freq: Four times a day (QID) | ORAL | 0 refills | Status: DC | PRN

## 2018-02-08 NOTE — Telephone Encounter (Signed)
Pt called and asked me to remind Jade to call in her pain meds today.

## 2018-02-08 NOTE — Telephone Encounter (Signed)
Done

## 2018-02-08 NOTE — Telephone Encounter (Signed)
Pt called and stated that she needs a refill on her percocet. Thanks

## 2018-02-09 NOTE — Telephone Encounter (Signed)
Pt advised.

## 2018-02-13 ENCOUNTER — Other Ambulatory Visit: Payer: Self-pay | Admitting: Physician Assistant

## 2018-02-15 ENCOUNTER — Ambulatory Visit (INDEPENDENT_AMBULATORY_CARE_PROVIDER_SITE_OTHER): Payer: Medicare HMO | Admitting: Physician Assistant

## 2018-02-15 DIAGNOSIS — Z951 Presence of aortocoronary bypass graft: Secondary | ICD-10-CM | POA: Diagnosis not present

## 2018-02-15 DIAGNOSIS — I4891 Unspecified atrial fibrillation: Secondary | ICD-10-CM

## 2018-02-15 DIAGNOSIS — Z952 Presence of prosthetic heart valve: Secondary | ICD-10-CM

## 2018-02-15 LAB — POCT INR: INR: 1.4

## 2018-02-15 NOTE — Patient Instructions (Signed)
Increase dose: take 2mg  every day except Sunday and Wednesday. Follow up in 2 weeks.

## 2018-02-15 NOTE — Progress Notes (Signed)
HPI: Patient is here to have her coumadin checked. Her INR was 4.7 mg on 02/02/18.   Assessment and Plan: INR resutls are 1.4 mg. Per provider - patient advised to take 2 mg - Monday, Tuesday, Thursday, Friday and Saturday. She is to take 1 mg - Sunday and Wednesday. Patient stated she understood - wrote new instruction/directions for patient as well.

## 2018-02-28 ENCOUNTER — Other Ambulatory Visit: Payer: Self-pay | Admitting: Physician Assistant

## 2018-02-28 NOTE — Telephone Encounter (Signed)
Dr. Madilyn Fireman can you look at this and let me know if ok to fill. Hasn't been filled since 08/2017 and I dont see in the notes anywhere of why she was given it.

## 2018-03-01 ENCOUNTER — Telehealth: Payer: Self-pay

## 2018-03-01 ENCOUNTER — Ambulatory Visit (INDEPENDENT_AMBULATORY_CARE_PROVIDER_SITE_OTHER): Payer: Medicare HMO | Admitting: Physician Assistant

## 2018-03-01 DIAGNOSIS — I4891 Unspecified atrial fibrillation: Secondary | ICD-10-CM

## 2018-03-01 DIAGNOSIS — Z951 Presence of aortocoronary bypass graft: Secondary | ICD-10-CM

## 2018-03-01 DIAGNOSIS — Z952 Presence of prosthetic heart valve: Secondary | ICD-10-CM | POA: Diagnosis not present

## 2018-03-01 LAB — POCT INR: INR: 1.9

## 2018-03-01 NOTE — Patient Instructions (Signed)
Increase dose: take 2mg  every day except Sunday 1mg .  Follow up in 2 weeks.

## 2018-03-01 NOTE — Telephone Encounter (Addendum)
Per provider - called patient advised to begin taking 2 mg every day except Sunday and to follow up in two weeks. Patient verbally repeated new directions/instructions and stated she understood- no further questions.

## 2018-03-01 NOTE — Telephone Encounter (Deleted)
Patient verbally repeated new directions/instructions and stated she understood - no further questions.

## 2018-03-01 NOTE — Progress Notes (Signed)
Patient ID: Janet Joyce, female   DOB: 1956-07-04, 62 y.o.   MRN: [id number]  Increase dose: take 2mg  every day except Sunday 1mg .  Follow up in 2 weeks.

## 2018-03-02 NOTE — Progress Notes (Signed)
Patient was called yesterday, Tuesday regarding instruction/direction change in her medication. Patient is to take 2 mg every day except Sunday. On Sunday she is to take 1 mg and follow in two weeks. Patient verbally repeated new instructions - no further questions.

## 2018-03-07 ENCOUNTER — Other Ambulatory Visit: Payer: Self-pay

## 2018-03-07 DIAGNOSIS — M545 Low back pain, unspecified: Secondary | ICD-10-CM

## 2018-03-07 DIAGNOSIS — G8929 Other chronic pain: Secondary | ICD-10-CM

## 2018-03-07 NOTE — Telephone Encounter (Signed)
Pt requesting med refill for oxycodone-ace. Pt did not state which pharmacy she would like rx sent to. Thanks.

## 2018-03-08 MED ORDER — OXYCODONE-ACETAMINOPHEN 10-325 MG PO TABS
1.0000 | ORAL_TABLET | Freq: Four times a day (QID) | ORAL | 0 refills | Status: DC | PRN
Start: 2018-03-08 — End: 2018-04-08

## 2018-03-08 NOTE — Telephone Encounter (Signed)
Pt has been updated that rx went to [facility] pharmacy on file.

## 2018-03-16 ENCOUNTER — Ambulatory Visit (INDEPENDENT_AMBULATORY_CARE_PROVIDER_SITE_OTHER): Payer: Medicare HMO | Admitting: Physician Assistant

## 2018-03-16 ENCOUNTER — Telehealth: Payer: Self-pay | Admitting: Emergency Medicine

## 2018-03-16 DIAGNOSIS — I480 Paroxysmal atrial fibrillation: Secondary | ICD-10-CM

## 2018-03-16 DIAGNOSIS — Z952 Presence of prosthetic heart valve: Secondary | ICD-10-CM

## 2018-03-16 DIAGNOSIS — Z951 Presence of aortocoronary bypass graft: Secondary | ICD-10-CM

## 2018-03-16 LAB — POCT INR: INR: 2

## 2018-03-16 NOTE — Progress Notes (Signed)
Increase dose: take 2mg  every day and 3mg  on Sunday. Follow up in 2 weeks.

## 2018-03-16 NOTE — Telephone Encounter (Signed)
Spoke with patient who confirmed she has been taking coumadin 2 mg pl qd of the week. Per J.Breeback, she was instructed to take 2 mg po every day except Sunday, when she should take 3 mg po. Patient understands and will comply.

## 2018-03-31 ENCOUNTER — Ambulatory Visit (INDEPENDENT_AMBULATORY_CARE_PROVIDER_SITE_OTHER): Payer: Medicare HMO | Admitting: Physician Assistant

## 2018-03-31 DIAGNOSIS — I4891 Unspecified atrial fibrillation: Secondary | ICD-10-CM | POA: Diagnosis not present

## 2018-03-31 DIAGNOSIS — Z951 Presence of aortocoronary bypass graft: Secondary | ICD-10-CM

## 2018-03-31 DIAGNOSIS — Z952 Presence of prosthetic heart valve: Secondary | ICD-10-CM

## 2018-03-31 LAB — POCT INR: INR: 4.3

## 2018-04-01 LAB — COMPLETE METABOLIC PANEL WITH GFR
AG Ratio: 1.7 (calc) (ref 1.0–2.5)
ALBUMIN MSPROF: 4 g/dL (ref 3.6–5.1)
ALKALINE PHOSPHATASE (APISO): 79 U/L (ref 33–130)
ALT: 12 U/L (ref 6–29)
AST: 17 U/L (ref 10–35)
BUN: 9 mg/dL (ref 7–25)
CO2: 28 mmol/L (ref 20–32)
CREATININE: 0.8 mg/dL (ref 0.50–0.99)
Calcium: 9.3 mg/dL (ref 8.6–10.4)
Chloride: 106 mmol/L (ref 98–110)
GFR, Est African American: 93 mL/min/{1.73_m2} (ref >=60)
GFR, Est Non African American: 80 mL/min/{1.73_m2} (ref >=60)
GLUCOSE: 102 mg/dL — AB (ref 65–99)
Globulin: 2.3 g/dL (calc) (ref 1.9–3.7)
Potassium: 4.2 mmol/L (ref 3.5–5.3)
Sodium: 138 mmol/L (ref 135–146)
Total Bilirubin: 0.4 mg/dL (ref 0.2–1.2)
Total Protein: 6.3 g/dL (ref 6.1–8.1)

## 2018-04-01 NOTE — Progress Notes (Signed)
Great! Labs look great. How is your eye this morning?

## 2018-04-01 NOTE — Progress Notes (Signed)
From what I saw there is something in her eye a stye. Most resolve on their own without any other treatment except warm compresses. She can use some saline eye drops to help flush them out.

## 2018-04-02 ENCOUNTER — Other Ambulatory Visit: Payer: Self-pay | Admitting: Family Medicine

## 2018-04-07 ENCOUNTER — Other Ambulatory Visit: Payer: Self-pay | Admitting: Physician Assistant

## 2018-04-07 DIAGNOSIS — M545 Low back pain: Principal | ICD-10-CM

## 2018-04-07 DIAGNOSIS — G8929 Other chronic pain: Secondary | ICD-10-CM

## 2018-04-07 NOTE — Telephone Encounter (Signed)
Pt called and stated she needs a refill on her pain medication. I advised her that Luvenia Starch is out of the office this week so another provider may have to sign off on it. Thanks

## 2018-04-07 NOTE — Telephone Encounter (Signed)
She needs nurse visit to sign a pain contract and do a urine. Needs documented opoid risk assessment.

## 2018-04-08 ENCOUNTER — Ambulatory Visit (INDEPENDENT_AMBULATORY_CARE_PROVIDER_SITE_OTHER): Payer: Medicare HMO | Admitting: Family Medicine

## 2018-04-08 VITALS — BP 126/72 | HR 66

## 2018-04-08 DIAGNOSIS — G8929 Other chronic pain: Secondary | ICD-10-CM | POA: Diagnosis not present

## 2018-04-08 DIAGNOSIS — M545 Low back pain: Secondary | ICD-10-CM

## 2018-04-08 MED ORDER — OXYCODONE-ACETAMINOPHEN 10-325 MG PO TABS: 1.0000 | ORAL_TABLET | Freq: Four times a day (QID) | ORAL | 0 refills | Status: DC | PRN

## 2018-04-08 NOTE — Telephone Encounter (Signed)
If she can come in today and go ahead and get everything updated I do not mind refilling for 30 days.  If she cannot make it until May 3 then I will fill enough medication until her appointment.

## 2018-04-08 NOTE — Telephone Encounter (Signed)
Spoke with Pt, she will come in this afternoon for UDS and pain contract signature.

## 2018-04-08 NOTE — Progress Notes (Signed)
Pt came into clinic today to sign pain contract for 2019. Opioid risk tool completed, score: 1. Pt also provided urine for UDS. No complications or concerns.

## 2018-04-08 NOTE — Telephone Encounter (Signed)
Pt has a [place] INR check on 04/15/18, can this be done at that time?

## 2018-04-08 NOTE — Progress Notes (Signed)
Agree with documentation as above.  Management agreement, and opioid risk tool and urine drug screen performed.  Medication filled for 30 days.  Beatrice Lecher, MD

## 2018-04-09 LAB — DRUG ABUSE PANEL 10-50 NO CONF, U
AMPHETAMINES (1000 NG/ML SCRN): NEGATIVE
BARBITURATES: NEGATIVE
BENZODIAZEPINES: POSITIVE — AB
COCAINE METABOLITES: NEGATIVE
MARIJUANA MET (50 ng/mL SCRN): NEGATIVE
METHADONE: NEGATIVE
METHAQUALONE: NEGATIVE
OPIATES: NEGATIVE
PHENCYCLIDINE: NEGATIVE
PROPOXYPHENE: NEGATIVE

## 2018-04-11 LAB — TEST AUTHORIZATION

## 2018-04-12 DIAGNOSIS — R69 Illness, unspecified: Secondary | ICD-10-CM | POA: Diagnosis not present

## 2018-04-15 ENCOUNTER — Ambulatory Visit (INDEPENDENT_AMBULATORY_CARE_PROVIDER_SITE_OTHER): Payer: Medicare HMO | Admitting: Physician Assistant

## 2018-04-15 DIAGNOSIS — Z952 Presence of prosthetic heart valve: Secondary | ICD-10-CM

## 2018-04-15 DIAGNOSIS — Z951 Presence of aortocoronary bypass graft: Secondary | ICD-10-CM

## 2018-04-15 DIAGNOSIS — I4891 Unspecified atrial fibrillation: Secondary | ICD-10-CM

## 2018-04-15 LAB — POCT INR: INR: 2

## 2018-04-17 LAB — PAIN MGMT, OPIATES EXP. QN, UR
Codeine: NEGATIVE ng/mL (ref <50)
Hydrocodone: NEGATIVE ng/mL (ref <50)
Hydromorphone: NEGATIVE ng/mL (ref <50)
Morphine: NEGATIVE ng/mL (ref <50)
Norhydrocodone: NEGATIVE ng/mL (ref <50)
Noroxycodone: NEGATIVE ng/mL (ref <50)
OXYCODONE: NEGATIVE ng/mL (ref <50)
OXYMORPHONE: NEGATIVE ng/mL (ref <50)

## 2018-04-18 NOTE — Progress Notes (Signed)
Next INR need urine to repeat more sensitive drug testing looking for opioids.

## 2018-04-26 ENCOUNTER — Other Ambulatory Visit: Payer: Self-pay | Admitting: Physician Assistant

## 2018-04-27 ENCOUNTER — Other Ambulatory Visit: Payer: Self-pay | Admitting: *Deleted

## 2018-04-27 DIAGNOSIS — R609 Edema, unspecified: Secondary | ICD-10-CM | POA: Insufficient documentation

## 2018-04-28 ENCOUNTER — Other Ambulatory Visit: Payer: Self-pay | Admitting: Physician Assistant

## 2018-04-28 DIAGNOSIS — B351 Tinea unguium: Secondary | ICD-10-CM

## 2018-04-28 DIAGNOSIS — Z01419 Encounter for gynecological examination (general) (routine) without abnormal findings: Secondary | ICD-10-CM | POA: Diagnosis not present

## 2018-04-28 DIAGNOSIS — R350 Frequency of micturition: Secondary | ICD-10-CM | POA: Diagnosis not present

## 2018-04-28 LAB — HM PAP SMEAR

## 2018-05-05 ENCOUNTER — Telehealth: Payer: Self-pay | Admitting: Physician Assistant

## 2018-05-05 DIAGNOSIS — G8929 Other chronic pain: Secondary | ICD-10-CM

## 2018-05-05 DIAGNOSIS — M545 Low back pain: Principal | ICD-10-CM

## 2018-05-05 NOTE — Telephone Encounter (Signed)
Pt. Called at 415 on 05/05/18. Would refill on percocet called in to pharmacy on file. She is worried about not being able to get it over the holiday weekend.

## 2018-05-05 NOTE — Telephone Encounter (Signed)
Rx not due until 05/08/18. Routing.

## 2018-05-06 MED ORDER — OXYCODONE-ACETAMINOPHEN 10-325 MG PO TABS: 1.0000 | ORAL_TABLET | Freq: Four times a day (QID) | ORAL | 0 refills | Status: DC | PRN

## 2018-05-06 NOTE — Telephone Encounter (Signed)
Sent early to pharmacy.

## 2018-05-06 NOTE — Telephone Encounter (Signed)
Pt advised RX was sent to pharmacy.  No further needs at this time

## 2018-05-08 ENCOUNTER — Other Ambulatory Visit: Payer: Self-pay | Admitting: Physician Assistant

## 2018-05-10 ENCOUNTER — Other Ambulatory Visit: Payer: Self-pay | Admitting: Physician Assistant

## 2018-05-10 DIAGNOSIS — B351 Tinea unguium: Secondary | ICD-10-CM

## 2018-05-13 ENCOUNTER — Ambulatory Visit (INDEPENDENT_AMBULATORY_CARE_PROVIDER_SITE_OTHER): Payer: Medicare HMO | Admitting: Physician Assistant

## 2018-05-13 VITALS — BP 118/68 | HR 66 | Wt 170.0 lb

## 2018-05-13 DIAGNOSIS — I4891 Unspecified atrial fibrillation: Secondary | ICD-10-CM

## 2018-05-13 DIAGNOSIS — R791 Abnormal coagulation profile: Secondary | ICD-10-CM

## 2018-05-13 DIAGNOSIS — Z951 Presence of aortocoronary bypass graft: Secondary | ICD-10-CM

## 2018-05-13 DIAGNOSIS — Z952 Presence of prosthetic heart valve: Secondary | ICD-10-CM

## 2018-05-13 LAB — POCT INR: INR: 3.9 — AB (ref 2.0–3.0)

## 2018-05-13 MED ORDER — WARFARIN SODIUM 2 MG PO TABS: 2.0000 mg | ORAL_TABLET | Freq: Every day | ORAL | 3 refills | Status: DC

## 2018-05-13 NOTE — Progress Notes (Signed)
Due to patients flutuating INR I think we should test anti phosolipid antibodies which were ordered. Decreased dose to 2mg  everyday except 1mg  on Sunday. Follow up in 2 weeks. Iran Planas PA-C

## 2018-05-13 NOTE — Addendum Note (Signed)
Addended by: Huel Cote on: 05/13/2018 03:28 PM   Modules accepted: Orders

## 2018-05-13 NOTE — Progress Notes (Signed)
Pt advised, she will get labs done at next visit since she lives out of town. Pt requested rx for 2mg  tabs to be sent in. rx sent.

## 2018-05-15 ENCOUNTER — Other Ambulatory Visit: Payer: Self-pay | Admitting: Physician Assistant

## 2018-05-15 DIAGNOSIS — B351 Tinea unguium: Secondary | ICD-10-CM

## 2018-05-25 ENCOUNTER — Ambulatory Visit (INDEPENDENT_AMBULATORY_CARE_PROVIDER_SITE_OTHER): Payer: Medicare HMO | Admitting: Physician Assistant

## 2018-05-25 ENCOUNTER — Encounter: Payer: Self-pay | Admitting: Physician Assistant

## 2018-05-25 VITALS — BP 125/52 | HR 66 | Ht 63.0 in | Wt 171.0 lb

## 2018-05-25 DIAGNOSIS — I4891 Unspecified atrial fibrillation: Secondary | ICD-10-CM | POA: Diagnosis not present

## 2018-05-25 DIAGNOSIS — J40 Bronchitis, not specified as acute or chronic: Secondary | ICD-10-CM

## 2018-05-25 DIAGNOSIS — Z951 Presence of aortocoronary bypass graft: Secondary | ICD-10-CM

## 2018-05-25 DIAGNOSIS — Z952 Presence of prosthetic heart valve: Secondary | ICD-10-CM | POA: Diagnosis not present

## 2018-05-25 DIAGNOSIS — R05 Cough: Secondary | ICD-10-CM

## 2018-05-25 DIAGNOSIS — J329 Chronic sinusitis, unspecified: Secondary | ICD-10-CM

## 2018-05-25 DIAGNOSIS — R059 Cough, unspecified: Secondary | ICD-10-CM

## 2018-05-25 LAB — POCT INR: INR: 1.8 — AB (ref 2.0–3.0)

## 2018-05-25 MED ORDER — BENZONATATE 200 MG PO CAPS: 200.0000 mg | ORAL_CAPSULE | Freq: Two times a day (BID) | ORAL | 0 refills | Status: DC | PRN

## 2018-05-25 MED ORDER — DOXYCYCLINE HYCLATE 100 MG PO TABS
100.0000 mg | ORAL_TABLET | Freq: Two times a day (BID) | ORAL | 0 refills | Status: DC
Start: 2018-05-25 — End: 2018-06-21

## 2018-05-25 MED ORDER — METHYLPREDNISOLONE ACETATE 40 MG/ML IJ SUSP
40.0000 mg | Freq: Once | INTRAMUSCULAR | Status: AC
  Administered 2018-05-25: 40 mg via INTRAMUSCULAR

## 2018-05-25 NOTE — Patient Instructions (Addendum)
Recheck in 2 weeks.   Delsym/robatussin   Sinusitis, Adult Sinusitis is soreness and inflammation of your sinuses. Sinuses are hollow spaces in the bones around your face. They are located:  Around your eyes.  In the middle of your forehead.  Behind your nose.  In your cheekbones.  Your sinuses and nasal passages are lined with a stringy fluid (mucus). Mucus normally drains out of your sinuses. When your nasal tissues get inflamed or swollen, the mucus can get trapped or blocked so air cannot flow through your sinuses. This lets bacteria, viruses, and funguses grow, and that leads to infection. Follow these instructions at home: Medicines  Take, use, or apply over-the-counter and prescription medicines only as told by your doctor. These may include nasal sprays.  If you were prescribed an antibiotic medicine, take it as told by your doctor. Do not stop taking the antibiotic even if you start to feel better. Hydrate and Humidify  Drink enough water to keep your pee (urine) clear or pale yellow.  Use a cool mist humidifier to keep the humidity level in your home above 50%.  Breathe in steam for 10-15 minutes, 3-4 times a day or as told by your doctor. You can do this in the bathroom while a hot shower is running.  Try not to spend time in cool or dry air. Rest  Rest as much as possible.  Sleep with your head raised (elevated).  Make sure to get enough sleep each night. General instructions  Put a warm, moist washcloth on your face 3-4 times a day or as told by your doctor. This will help with discomfort.  Wash your hands often with soap and water. If there is no soap and water, use hand sanitizer.  Do not smoke. Avoid being around people who are smoking (secondhand smoke).  Keep all follow-up visits as told by your doctor. This is important. Contact a doctor if:  You have a fever.  Your symptoms get worse.  Your symptoms do not get better within 10 days. Get help  right away if:  You have a very bad headache.  You cannot stop throwing up (vomiting).  You have pain or swelling around your face or eyes.  You have trouble seeing.  You feel confused.  Your neck is stiff.  You have trouble breathing. This information is not intended to replace advice given to you by your health care provider. Make sure you discuss any questions you have with your health care provider. Document Released: 05/18/2008 Document Revised: 07/26/2016 Document Reviewed: 09/25/2015 Elsevier Interactive Patient Education  [employer].

## 2018-05-27 ENCOUNTER — Ambulatory Visit: Payer: Medicare HMO

## 2018-05-27 NOTE — Progress Notes (Signed)
Subjective:    Patient ID: Janet Joyce, female    DOB: 24-Jan-1956, 62 y.o.   MRN: [id number]  HPI Patient is a 62 year old female with a history of atrial fibrillation, CAD, hypertension, mechanical heart valve who presents to the clinic today for INR recheck and also discussed 2 weeks of sinus pressure, headache, nasal congestion, coughing up clear to yellow sputum.  She denies any fever, chills, body aches.  She does feel like her lungs are tight.  She does have some shortness of breath.  She has not taken anything over-the-counter because she is worried about interactions with her current medications.  Although patient is a daily smoker she has been evaluated by pulmonology and had pulmonary function tests that revealed no COPD.  .. Active Ambulatory Problems    Diagnosis Date Noted  . CAD (coronary artery disease) 01/31/2013  . Hyperlipidemia LDL goal <70 01/31/2013  . H/O aortic valve replacement 01/31/2013  . GERD (gastroesophageal reflux disease) 01/31/2013  . S/P CABG (coronary artery bypass graft) 01/31/2013  . Anxiety and depression 01/31/2013  . IBS (irritable bowel syndrome) 01/31/2013  . Status post lung surgery 01/31/2013  . Chronic back pain 01/31/2013  . Tobacco use disorder 01/31/2013  . Bilateral knee pain 01/31/2013  . Hypertriglyceridemia 02/07/2013  . Atrial fibrillation ([place]) 02/20/2013  . Prediabetes 02/21/2013  . Encounter for monitoring Coumadin therapy 03/10/2013  . Urinary incontinence 04/21/2013  . Overactive bladder 04/21/2013  . Lumbago 05/16/2013  . Orthostatic hypotension 07/04/2013  . Essential hypertension, benign 08/04/2013  . Iliotibial band syndrome 11/06/2013  . Solitary pulmonary nodule 09/03/2014  . Acute bronchitis 07/03/2015  . Elevated INR 07/03/2015  . Allergic conjunctivitis 11/01/2015  . Humeral head fracture 11/11/2015  . Decreased diffusion capacity of lung 07/21/2017  . SOB (shortness of breath) 07/28/2017  . Elevated TSH  08/19/2017  . Tricuspid regurgitation 11/30/2017  . Grade II diastolic dysfunction 78/24/2353  . Pancreatic cyst 01/07/2018  . Fungal toenail infection 01/30/2018  . Ingrown toenail of left foot 01/30/2018  . Encounter for chronic pain management 04/08/2018  . Edema 04/27/2018   Resolved Ambulatory Problems    Diagnosis Date Noted  . Hypokalemia 01/31/2013  . COPD exacerbation ([place]) 08/24/2014  . Emphysema of lung ([place]) 09/03/2014  . Candidal intertrigo 01/28/2017   Past Medical History:  Diagnosis Date  . CHF (congestive heart failure) ([place])   . Depression   . Heart attack ([place])   . Hyperlipidemia       Review of Systems    see HPI.  Objective:   Physical Exam  Constitutional: She is oriented to person, place, and time. She appears well-developed and well-nourished.  HENT:  Head: Normocephalic and atraumatic.  Right Ear: External ear normal.  Left Ear: External ear normal.  TM's erythematous.  Tenderness over maxillary sinuses to palpation.  Nasal turbinates red and swollen.   Eyes: Conjunctivae and EOM are normal.  Neck: Normal range of motion. Neck supple.  Cardiovascular: Normal rate and regular rhythm.  Mechanical heart valve.   Pulmonary/Chest: Effort normal.  Expiratory wheezing on exam with deep breaths.   Lymphadenopathy:    She has no cervical adenopathy.  Neurological: She is alert and oriented to person, place, and time.  Psychiatric: She has a normal mood and affect. Her behavior is normal.          Assessment & Plan:  [name] KitchenMarland KitchenDiagnoses and all orders for this visit:  Sinobronchitis -     doxycycline (VIBRA-TABS) 100 MG tablet; Take  1 tablet (100 mg total) by mouth 2 (two) times daily. For 10 days. -     methylPREDNISolone acetate (DEPO-MEDROL) injection 40 mg  H/O aortic valve replacement -     POCT INR  S/P CABG (coronary artery bypass graft) -     POCT INR  Atrial fibrillation, unspecified type (HCC) -     POCT INR  Cough -      benzonatate (TESSALON) 200 MG capsule; Take 1 capsule (200 mg total) by mouth 2 (two) times daily as needed for cough. -     methylPREDNISolone acetate (DEPO-MEDROL) injection 40 mg   .. Results for orders placed or performed in visit on 05/25/18  POCT INR  Result Value Ref Range   INR 1.8 (A) 2.0 - 3.0   INR low today but starting an abx so will not make any changes and recheck after abx. Discussed symptomatic care with mucinex and flonase. Tessalon pearls and delsym for cough. Rest and hydration. Consider humidifer in room at night. Follow up as needed.

## 2018-05-31 DIAGNOSIS — Z1212 Encounter for screening for malignant neoplasm of rectum: Secondary | ICD-10-CM | POA: Diagnosis not present

## 2018-05-31 DIAGNOSIS — Z1211 Encounter for screening for malignant neoplasm of colon: Secondary | ICD-10-CM | POA: Diagnosis not present

## 2018-06-01 ENCOUNTER — Other Ambulatory Visit: Payer: Self-pay | Admitting: Physician Assistant

## 2018-06-02 DIAGNOSIS — R350 Frequency of micturition: Secondary | ICD-10-CM | POA: Diagnosis not present

## 2018-06-03 ENCOUNTER — Telehealth: Payer: Self-pay | Admitting: Physician Assistant

## 2018-06-03 NOTE — Telephone Encounter (Signed)
PT stated her hair is "Falling out and becoming thinner". PT wants to know if this is something to come in for.

## 2018-06-07 ENCOUNTER — Ambulatory Visit (INDEPENDENT_AMBULATORY_CARE_PROVIDER_SITE_OTHER): Payer: Medicare HMO | Admitting: Physician Assistant

## 2018-06-07 DIAGNOSIS — I4891 Unspecified atrial fibrillation: Secondary | ICD-10-CM

## 2018-06-07 DIAGNOSIS — M545 Low back pain, unspecified: Secondary | ICD-10-CM

## 2018-06-07 DIAGNOSIS — Z952 Presence of prosthetic heart valve: Secondary | ICD-10-CM

## 2018-06-07 DIAGNOSIS — G8929 Other chronic pain: Secondary | ICD-10-CM

## 2018-06-07 DIAGNOSIS — Z951 Presence of aortocoronary bypass graft: Secondary | ICD-10-CM

## 2018-06-07 LAB — POCT INR: INR: 2.3 (ref 2.0–3.0)

## 2018-06-07 MED ORDER — OXYCODONE-ACETAMINOPHEN 10-325 MG PO TABS: 1.0000 | ORAL_TABLET | Freq: Four times a day (QID) | ORAL | 0 refills | Status: DC | PRN

## 2018-06-07 NOTE — Progress Notes (Signed)
increase dose to 2 mg daily every day. Follow up in 2 weeks.

## 2018-06-07 NOTE — Progress Notes (Signed)
Pt advised.

## 2018-06-21 ENCOUNTER — Encounter: Payer: Self-pay | Admitting: Physician Assistant

## 2018-06-21 ENCOUNTER — Ambulatory Visit (INDEPENDENT_AMBULATORY_CARE_PROVIDER_SITE_OTHER): Payer: Medicare HMO | Admitting: Physician Assistant

## 2018-06-21 VITALS — BP 116/67 | HR 69 | Ht 62.99 in | Wt 168.0 lb

## 2018-06-21 DIAGNOSIS — R195 Other fecal abnormalities: Secondary | ICD-10-CM | POA: Diagnosis not present

## 2018-06-21 DIAGNOSIS — L659 Nonscarring hair loss, unspecified: Secondary | ICD-10-CM | POA: Diagnosis not present

## 2018-06-21 DIAGNOSIS — Z5181 Encounter for therapeutic drug level monitoring: Secondary | ICD-10-CM | POA: Diagnosis not present

## 2018-06-21 DIAGNOSIS — Z7901 Long term (current) use of anticoagulants: Secondary | ICD-10-CM | POA: Diagnosis not present

## 2018-06-21 NOTE — Patient Instructions (Addendum)
Biotin for hair and nails.  INR 1 week after colonoscopy.     [name] Kitchen

## 2018-06-21 NOTE — Progress Notes (Signed)
poc

## 2018-06-21 NOTE — Progress Notes (Signed)
Subjective:    Patient ID: Janet Joyce, female    DOB: 1956-04-27, 62 y.o.   MRN: [id number]  HPI  Patient is a 62 year old female with CAD, hypertension, atrial fibrillation who presents to the clinic for INR check and to discuss hair loss.  Patient has been taking her Coumadin 2 mg daily.  She denies any concerns or complaints.  She does feel like she is bruising fairly easily but this is not the ordinary.  She complains of hair loss for the last 2 months.  Every time she brushes her hair she can get a handful of hair.  She denies any spots of baldness.  Overall she does feel more tired.  She denies any medication changes.  She has never had this problem before.  Patient did have a positive Cologuard and she has been seen by Janet Joyce at gastroenterology in [place] and will need a colonoscopy.  She is on Coumadin and will need to stop for a few days.  She needs me to send a order to her gastroenterologist for how long she should stop.   .. Active Ambulatory Problems    Diagnosis Date Noted  . CAD (coronary artery disease) 01/31/2013  . Hyperlipidemia LDL goal <70 01/31/2013  . H/O aortic valve replacement 01/31/2013  . GERD (gastroesophageal reflux disease) 01/31/2013  . S/P CABG (coronary artery bypass graft) 01/31/2013  . Anxiety and depression 01/31/2013  . IBS (irritable bowel syndrome) 01/31/2013  . Status post lung surgery 01/31/2013  . Chronic back pain 01/31/2013  . Tobacco use disorder 01/31/2013  . Bilateral knee pain 01/31/2013  . Hypertriglyceridemia 02/07/2013  . Atrial fibrillation ([place]) 02/20/2013  . Prediabetes 02/21/2013  . Encounter for monitoring Coumadin therapy 03/10/2013  . Urinary incontinence 04/21/2013  . Overactive bladder 04/21/2013  . Lumbago 05/16/2013  . Orthostatic hypotension 07/04/2013  . Essential hypertension, benign 08/04/2013  . Iliotibial band syndrome 11/06/2013  . Solitary pulmonary nodule 09/03/2014  . Acute bronchitis  07/03/2015  . Elevated INR 07/03/2015  . Allergic conjunctivitis 11/01/2015  . Humeral head fracture 11/11/2015  . Decreased diffusion capacity of lung 07/21/2017  . SOB (shortness of breath) 07/28/2017  . Elevated TSH 08/19/2017  . Tricuspid regurgitation 11/30/2017  . Grade II diastolic dysfunction 75/44/9201  . Pancreatic cyst 01/07/2018  . Fungal toenail infection 01/30/2018  . Ingrown toenail of left foot 01/30/2018  . Encounter for chronic pain management 04/08/2018  . Edema 04/27/2018  . Positive colorectal cancer screening using Cologuard test 06/24/2018  . Hair loss 06/24/2018   Resolved Ambulatory Problems    Diagnosis Date Noted  . Hypokalemia 01/31/2013  . COPD exacerbation ([place]) 08/24/2014  . Emphysema of lung ([place]) 09/03/2014  . Candidal intertrigo 01/28/2017   Past Medical History:  Diagnosis Date  . CHF (congestive heart failure) ([place])   . Depression   . Heart attack ([place])   . Hyperlipidemia         Review of Systems  All other systems reviewed and are negative.      Objective:   Physical Exam  Constitutional: She is oriented to person, place, and time. She appears well-developed and well-nourished.  HENT:  Head: Normocephalic and atraumatic.  Right Ear: External ear normal.  Left Ear: External ear normal.  Nose: Nose normal.  Mouth/Throat: Oropharynx is clear and moist.  Positive pull test. No patches of hair loss.   Eyes: Conjunctivae and EOM are normal.  Neck: No thyromegaly present.  Cardiovascular: Normal rate and regular rhythm.  Pulmonary/Chest: Effort normal and breath sounds normal.  Neurological: She is alert and oriented to person, place, and time.  Psychiatric: She has a normal mood and affect. Her behavior is normal.          Assessment & Plan:  [name] KitchenMarland KitchenDiagnoses and all orders for this visit:  Encounter for monitoring Coumadin therapy  Hair loss -     POCT INR -     TSH -     CBC with Differential/Platelet -      Ferritin -     B12 and Folate Panel -     VITAMIN D 25 Hydroxy (Vit-D Deficiency, Fractures) -     Sed Rate (ESR)  Positive colorectal cancer screening using Cologuard test   INR was 2.4 today. Stay on same dose. She has upcoming colonoscopy where she will have to go off coumadin for 5 days. Will recheck after resuming therapy for 1 week. Discussed risk of coming off is low but still a risk;however, it is something she needs to do to evaluate for colon pathology. Will send ok for 5 days off coumadin to GI doctor.   Will do labs to evaluate for hair loss. Discussed hair loss consistent with telogen effluvium. HO given. Start biotin. Consider rogaine in the future if hair loss continues. No new medication changes to cause this. Likely this is stress induced or cyclical. Continue to monitor.   [name] Kitchen.Spent 30 minutes with patient and greater than 50 percent of visit spent counseling patient regarding treatment plan.

## 2018-06-22 LAB — CBC WITH DIFFERENTIAL/PLATELET
BASOS ABS: 43 {cells}/uL (ref 0–200)
Basophils Relative: 0.6 %
EOS ABS: 72 {cells}/uL (ref 15–500)
Eosinophils Relative: 1 %
HEMATOCRIT: 42.2 % (ref 35.0–45.0)
Hemoglobin: 13.3 g/dL (ref 11.7–15.5)
Lymphs Abs: 2851 cells/uL (ref 850–3900)
MCH: 26.8 pg — ABNORMAL LOW (ref 27.0–33.0)
MCHC: 31.5 g/dL — AB (ref 32.0–36.0)
MCV: 84.9 fL (ref 80.0–100.0)
MONOS PCT: 6.3 %
MPV: 11.1 fL (ref 7.5–12.5)
Neutro Abs: 3780 cells/uL (ref 1500–7800)
Neutrophils Relative %: 52.5 %
PLATELETS: 229 10*3/uL (ref 140–400)
RBC: 4.97 10*6/uL (ref 3.80–5.10)
RDW: 14.4 % (ref 11.0–15.0)
TOTAL LYMPHOCYTE: 39.6 %
WBC mixed population: 454 cells/uL (ref 200–950)
WBC: 7.2 10*3/uL (ref 3.8–10.8)

## 2018-06-22 LAB — B12 AND FOLATE PANEL
Folate: 9.8 ng/mL
VITAMIN B 12: 254 pg/mL (ref 200–1100)

## 2018-06-22 LAB — SEDIMENTATION RATE: SED RATE: 6 mm/h (ref 0–30)

## 2018-06-22 LAB — FERRITIN: FERRITIN: 17 ng/mL (ref 16–232)

## 2018-06-22 LAB — VITAMIN D 25 HYDROXY (VIT D DEFICIENCY, FRACTURES): VIT D 25 HYDROXY: 34 ng/mL (ref 30–100)

## 2018-06-22 LAB — TSH: TSH: 2.92 mIU/L (ref 0.40–4.50)

## 2018-06-22 NOTE — Progress Notes (Signed)
Call pt: thyroid looks great. Not anemic. b12 much lower than 4 months ago. Have you changed the way you take b12? Vitamin D looks ok. Iron stores are a little low but hgb fine. Increase iron rich foods. No levels way out of normal but b12 and anemia are a little low. Add back in b12 supplement.

## 2018-06-24 ENCOUNTER — Encounter: Payer: Self-pay | Admitting: Physician Assistant

## 2018-06-24 ENCOUNTER — Telehealth: Payer: Self-pay | Admitting: Physician Assistant

## 2018-06-24 DIAGNOSIS — L659 Nonscarring hair loss, unspecified: Secondary | ICD-10-CM | POA: Insufficient documentation

## 2018-06-24 DIAGNOSIS — R195 Other fecal abnormalities: Secondary | ICD-10-CM | POA: Insufficient documentation

## 2018-06-24 LAB — POCT INR: INR: 2.4 (ref 2.0–3.0)

## 2018-06-24 NOTE — Telephone Encounter (Signed)
Entered

## 2018-06-24 NOTE — Telephone Encounter (Signed)
Point of care INR was not recorded for this patient.

## 2018-06-27 ENCOUNTER — Telehealth: Payer: Self-pay | Admitting: Physician Assistant

## 2018-06-27 NOTE — Telephone Encounter (Signed)
Pt needs to know when to stop her coumadin prior to scheduled colonoscopy on 07/05/18. I called over to Dr Vertell Limber office [phone]) to see what their protocol is, they were unavailable and are supposed to call back.  Will route to PCP for review/recommendation.

## 2018-06-27 NOTE — Telephone Encounter (Signed)
Per PCP, Pt to hold coumadin for 5 days before procedure. Faxed written copy to Dr Vertell Limber. Pt advised.

## 2018-07-05 ENCOUNTER — Encounter: Payer: Self-pay | Admitting: Physician Assistant

## 2018-07-05 DIAGNOSIS — D127 Benign neoplasm of rectosigmoid junction: Secondary | ICD-10-CM | POA: Diagnosis not present

## 2018-07-05 DIAGNOSIS — D124 Benign neoplasm of descending colon: Secondary | ICD-10-CM | POA: Diagnosis not present

## 2018-07-05 DIAGNOSIS — D122 Benign neoplasm of ascending colon: Secondary | ICD-10-CM | POA: Diagnosis not present

## 2018-07-05 DIAGNOSIS — D123 Benign neoplasm of transverse colon: Secondary | ICD-10-CM | POA: Diagnosis not present

## 2018-07-05 DIAGNOSIS — R195 Other fecal abnormalities: Secondary | ICD-10-CM | POA: Diagnosis not present

## 2018-07-07 ENCOUNTER — Other Ambulatory Visit: Payer: Self-pay | Admitting: Family Medicine

## 2018-07-07 DIAGNOSIS — R69 Illness, unspecified: Secondary | ICD-10-CM | POA: Diagnosis not present

## 2018-07-07 DIAGNOSIS — M545 Low back pain: Principal | ICD-10-CM

## 2018-07-07 DIAGNOSIS — G8929 Other chronic pain: Secondary | ICD-10-CM

## 2018-07-07 NOTE — Telephone Encounter (Signed)
Will refill when she comes in for urine drug screen.  We can schedule for her for a nurse visit for that.

## 2018-07-08 ENCOUNTER — Telehealth: Payer: Self-pay | Admitting: Physician Assistant

## 2018-07-08 ENCOUNTER — Encounter: Payer: Self-pay | Admitting: Physician Assistant

## 2018-07-08 NOTE — Telephone Encounter (Signed)
Notified patient refill will not be filled until comes in for urine drug screen. Has appointment Wedneasday July 31st. KG LPN

## 2018-07-08 NOTE — Telephone Encounter (Signed)
Pt called.  She wants to know if she can be seen today because she is not able to get her medicine and it runs out today.

## 2018-07-08 NOTE — Telephone Encounter (Signed)
Per provider note, she can come in today for [place] for UDS

## 2018-07-08 NOTE — Telephone Encounter (Signed)
Pt says she'll just go ahead and keep the appointment already scheduled for 7/31.

## 2018-07-12 ENCOUNTER — Ambulatory Visit (INDEPENDENT_AMBULATORY_CARE_PROVIDER_SITE_OTHER): Payer: Medicare HMO | Admitting: Physician Assistant

## 2018-07-12 DIAGNOSIS — Z952 Presence of prosthetic heart valve: Secondary | ICD-10-CM

## 2018-07-12 DIAGNOSIS — G8929 Other chronic pain: Secondary | ICD-10-CM

## 2018-07-12 DIAGNOSIS — I4891 Unspecified atrial fibrillation: Secondary | ICD-10-CM | POA: Diagnosis not present

## 2018-07-12 DIAGNOSIS — Z951 Presence of aortocoronary bypass graft: Secondary | ICD-10-CM

## 2018-07-12 DIAGNOSIS — M545 Low back pain: Secondary | ICD-10-CM

## 2018-07-12 LAB — POCT INR: INR: 1.6 — AB (ref 2.0–3.0)

## 2018-07-12 MED ORDER — OXYCODONE-ACETAMINOPHEN 10-325 MG PO TABS: 1.0000 | ORAL_TABLET | Freq: Four times a day (QID) | ORAL | 0 refills | Status: DC | PRN

## 2018-07-12 NOTE — Progress Notes (Signed)
...   Results for orders placed or performed in visit on 07/12/18  POCT INR  Result Value Ref Range   INR 1.6 (A) 2.0 - 3.0   Increase coumadin to 2mg  once daily recheck in 1 week.   Refilled pain medications. UDS ordered today. Up to date pain contract.   [place] controlled substance database reviewed with no concerns.

## 2018-07-13 ENCOUNTER — Ambulatory Visit: Payer: Medicare HMO

## 2018-07-20 ENCOUNTER — Other Ambulatory Visit: Payer: Self-pay | Admitting: Family Medicine

## 2018-07-20 ENCOUNTER — Ambulatory Visit (INDEPENDENT_AMBULATORY_CARE_PROVIDER_SITE_OTHER): Payer: Medicare HMO | Admitting: Family Medicine

## 2018-07-20 DIAGNOSIS — Z951 Presence of aortocoronary bypass graft: Secondary | ICD-10-CM

## 2018-07-20 DIAGNOSIS — I4891 Unspecified atrial fibrillation: Secondary | ICD-10-CM | POA: Diagnosis not present

## 2018-07-20 DIAGNOSIS — Z952 Presence of prosthetic heart valve: Secondary | ICD-10-CM

## 2018-07-20 LAB — POCT INR: INR: 1.9 — AB (ref 2.0–3.0)

## 2018-07-20 NOTE — Progress Notes (Signed)
Patient advised of recommendations.  

## 2018-07-26 ENCOUNTER — Ambulatory Visit: Payer: Medicare HMO

## 2018-07-29 ENCOUNTER — Ambulatory Visit: Payer: Medicare HMO

## 2018-08-01 ENCOUNTER — Ambulatory Visit (INDEPENDENT_AMBULATORY_CARE_PROVIDER_SITE_OTHER): Payer: Medicare HMO | Admitting: Family Medicine

## 2018-08-01 DIAGNOSIS — I4891 Unspecified atrial fibrillation: Secondary | ICD-10-CM | POA: Diagnosis not present

## 2018-08-01 DIAGNOSIS — Z952 Presence of prosthetic heart valve: Secondary | ICD-10-CM

## 2018-08-01 DIAGNOSIS — Z951 Presence of aortocoronary bypass graft: Secondary | ICD-10-CM

## 2018-08-01 LAB — POCT INR: INR: 2.5 (ref 2.0–3.0)

## 2018-08-01 NOTE — Progress Notes (Signed)
Patient presents to clinic for INR check. Patient was asked if she had abnormal bruising, bleeding, shortness of breath, or hopitizations and patient denied.  Patient INR was 2.5 at visit today and she states she is currently taking 4 mg on Wednesday and Saturday and 2 mg the rest of the week. Patient was advised she would receive a call with any adjustments in her medication.  Patient refused BP re-check since top was elevated due to patient moving arm and stating this hurts. I advised the patient to keep her arm as still as possible so it could get an accurate reading and she was agreeable.

## 2018-08-02 NOTE — Progress Notes (Signed)
Patient was notified of Dr. Madilyn Fireman recommendations and voices understanding. She did not have any further questions. She will call back to schedule a nurse visit.

## 2018-08-08 DIAGNOSIS — Z1231 Encounter for screening mammogram for malignant neoplasm of breast: Secondary | ICD-10-CM | POA: Diagnosis not present

## 2018-08-08 LAB — HM MAMMOGRAPHY

## 2018-08-09 ENCOUNTER — Other Ambulatory Visit: Payer: Self-pay | Admitting: Physician Assistant

## 2018-08-11 ENCOUNTER — Other Ambulatory Visit: Payer: Self-pay

## 2018-08-11 DIAGNOSIS — M545 Low back pain: Principal | ICD-10-CM

## 2018-08-11 DIAGNOSIS — G8929 Other chronic pain: Secondary | ICD-10-CM

## 2018-08-12 MED ORDER — OXYCODONE-ACETAMINOPHEN 10-325 MG PO TABS: 1.0000 | ORAL_TABLET | Freq: Four times a day (QID) | ORAL | 0 refills | Status: DC | PRN

## 2018-08-18 ENCOUNTER — Encounter: Payer: Self-pay | Admitting: Physician Assistant

## 2018-08-25 ENCOUNTER — Ambulatory Visit (INDEPENDENT_AMBULATORY_CARE_PROVIDER_SITE_OTHER): Payer: Medicare HMO | Admitting: Family Medicine

## 2018-08-25 DIAGNOSIS — I4891 Unspecified atrial fibrillation: Secondary | ICD-10-CM

## 2018-08-25 DIAGNOSIS — Z952 Presence of prosthetic heart valve: Secondary | ICD-10-CM

## 2018-08-25 DIAGNOSIS — Z951 Presence of aortocoronary bypass graft: Secondary | ICD-10-CM

## 2018-08-25 LAB — POCT INR: INR: 3.3 — AB (ref 2.0–3.0)

## 2018-08-25 NOTE — Progress Notes (Signed)
Patient advised of recommendations.  

## 2018-09-01 ENCOUNTER — Ambulatory Visit: Payer: Medicare HMO

## 2018-09-12 ENCOUNTER — Telehealth: Payer: Self-pay | Admitting: Physician Assistant

## 2018-09-12 DIAGNOSIS — G8929 Other chronic pain: Secondary | ICD-10-CM

## 2018-09-12 DIAGNOSIS — M545 Low back pain: Principal | ICD-10-CM

## 2018-09-12 MED ORDER — OXYCODONE-ACETAMINOPHEN 10-325 MG PO TABS: 1.0000 | ORAL_TABLET | Freq: Four times a day (QID) | ORAL | 0 refills | Status: DC | PRN

## 2018-09-12 NOTE — Telephone Encounter (Signed)
Done

## 2018-09-12 NOTE — Telephone Encounter (Signed)
Routing to PCP, last fill date 08/12/18

## 2018-09-12 NOTE — Telephone Encounter (Signed)
Janet Joyce called this morning, wanting to have her script for percoset called in to the pharmacy on file.

## 2018-09-12 NOTE — Telephone Encounter (Signed)
Pt advised.

## 2018-09-19 ENCOUNTER — Ambulatory Visit (INDEPENDENT_AMBULATORY_CARE_PROVIDER_SITE_OTHER): Payer: Medicare HMO | Admitting: Physician Assistant

## 2018-09-19 DIAGNOSIS — I4891 Unspecified atrial fibrillation: Secondary | ICD-10-CM | POA: Diagnosis not present

## 2018-09-19 DIAGNOSIS — Z951 Presence of aortocoronary bypass graft: Secondary | ICD-10-CM

## 2018-09-19 DIAGNOSIS — Z952 Presence of prosthetic heart valve: Secondary | ICD-10-CM

## 2018-09-19 LAB — POCT INR: INR: 3.4 — AB (ref 2.0–3.0)

## 2018-09-19 NOTE — Progress Notes (Signed)
Janet Joyce presents to clinic for INR check.  Denies changes in medication, diet, and bruises. Will route to PCP for further review. -EH/RMA  Decrease dose to 4mg  Wednesday and 2mg  all other days. Recheck in 2 weeks.  Iran Planas PA_C

## 2018-09-20 NOTE — Progress Notes (Signed)
Pt notified and scheduled -EH/RMA

## 2018-10-04 ENCOUNTER — Ambulatory Visit (INDEPENDENT_AMBULATORY_CARE_PROVIDER_SITE_OTHER): Payer: Medicare HMO | Admitting: Physician Assistant

## 2018-10-04 DIAGNOSIS — Z952 Presence of prosthetic heart valve: Secondary | ICD-10-CM

## 2018-10-04 DIAGNOSIS — I4891 Unspecified atrial fibrillation: Secondary | ICD-10-CM

## 2018-10-04 DIAGNOSIS — Z951 Presence of aortocoronary bypass graft: Secondary | ICD-10-CM

## 2018-10-04 LAB — POCT INR: INR: 1.6 — AB (ref 2.0–3.0)

## 2018-10-04 NOTE — Progress Notes (Signed)
increase dose 3 mg on Wednesday, Sunday, fridays and 2 mg on Monday, Tuesday, Thursday, Saturday.  Recheck in 2 weeks.  (please send over coumadin 1mg )

## 2018-10-05 MED ORDER — WARFARIN SODIUM 1 MG PO TABS: 1.0000 mg | ORAL_TABLET | Freq: Every day | ORAL | 1 refills | Status: AC

## 2018-10-05 MED ORDER — WARFARIN SODIUM 2 MG PO TABS: 2.0000 mg | ORAL_TABLET | Freq: Every day | ORAL | 1 refills | Status: AC

## 2018-10-05 NOTE — Progress Notes (Signed)
Called pt with her INR results.  INR was 1.6. Per provider she is to increase her coumadin.Pt is to take 3 mg on Wednesday, Sunday, fridays and 2 mg on Monday, Tuesday, Thursday, Saturday.  Recheck in 2 weeks.  RX for 1 mg and 2 mg tablets were sent to patient preferred pharmacy. Pt was advised to recheck INR in 2 weeks,

## 2018-10-05 NOTE — Addendum Note (Signed)
Addended by: Dory Larsen on: 10/05/2018 08:30 AM   Modules accepted: Orders

## 2018-10-06 DIAGNOSIS — R69 Illness, unspecified: Secondary | ICD-10-CM | POA: Diagnosis not present

## 2018-10-12 ENCOUNTER — Telehealth: Payer: Self-pay | Admitting: Physician Assistant

## 2018-10-12 DIAGNOSIS — G8929 Other chronic pain: Secondary | ICD-10-CM

## 2018-10-12 DIAGNOSIS — M545 Low back pain: Principal | ICD-10-CM

## 2018-10-13 MED ORDER — OXYCODONE-ACETAMINOPHEN 10-325 MG PO TABS: 1.0000 | ORAL_TABLET | Freq: Four times a day (QID) | ORAL | 0 refills | Status: DC | PRN

## 2018-10-13 NOTE — Telephone Encounter (Signed)
Pt called again.  She is hoping to get refill on Percocet today.

## 2018-10-13 NOTE — Telephone Encounter (Signed)
Pt advised.

## 2018-10-13 NOTE — Telephone Encounter (Signed)
Routing to Provider in office, last Rx 09/12/18

## 2018-10-13 NOTE — Telephone Encounter (Signed)
Medication refilled.  I did go ahead and remove clonazepam from her medication list as it looks like it had not been filled since 2014.

## 2018-10-19 ENCOUNTER — Ambulatory Visit (INDEPENDENT_AMBULATORY_CARE_PROVIDER_SITE_OTHER): Payer: Medicare HMO | Admitting: Physician Assistant

## 2018-10-19 VITALS — BP 132/62 | HR 82 | Temp 97.6°F | Wt 167.1 lb

## 2018-10-19 DIAGNOSIS — Z951 Presence of aortocoronary bypass graft: Secondary | ICD-10-CM

## 2018-10-19 DIAGNOSIS — Z952 Presence of prosthetic heart valve: Secondary | ICD-10-CM

## 2018-10-19 DIAGNOSIS — I4891 Unspecified atrial fibrillation: Secondary | ICD-10-CM | POA: Diagnosis not present

## 2018-10-19 DIAGNOSIS — Z23 Encounter for immunization: Secondary | ICD-10-CM

## 2018-10-19 LAB — POCT INR: INR: 2 (ref 2.0–3.0)

## 2018-10-19 NOTE — Progress Notes (Deleted)
Subjective:   Allsion Nogales is a 62 y.o. female who presents for an Initial Medicare Annual Wellness Visit.  Review of Systems    No ROS.  Medicare Wellness Visit. Additional risk factors are reflected in the social history.  Sleep patterns: {SX; SLEEP PATTERNS:18802::"feels rested on waking","does not get up to void","gets up *** times nightly to void","sleeps *** hours nightly"}.   Home Safety/Smoke Alarms: Feels safe in home. Smoke alarms in place.  Living environment; residence and Firearm Safety: {Rehab home environment / accessibility:30080::"no firearms","firearms stored safely"}. Seat Belt Safety/Bike Helmet: Wears seat belt.   Female:   Pap-       Mammo- utd      Dexa scan-  Not of age for testing      CCS- utd      Objective:    There were no vitals filed for this visit. There is no height or weight on file to calculate BMI.  No flowsheet data found.  Current Medications (verified) Outpatient Encounter Medications as of 10/31/2018  Medication Sig  . albuterol (PROVENTIL HFA;VENTOLIN HFA) 108 (90 Base) MCG/ACT inhaler TAKE 2 PUFFS BY MOUTH EVERY 6 HOURS AS NEEDED FOR WHEEZE OR SHORTNESS OF BREATH  . ARIPiprazole (ABILIFY) 2 MG tablet 2 mg.  . aspirin 81 MG tablet Take 81 mg by mouth.  . carvedilol (COREG) 3.125 MG tablet Take 1 tablet (3.125 mg total) by mouth 2 (two) times daily.  [name] Kitchen ezetimibe (ZETIA) 10 MG tablet Take 1 tablet (10 mg total) by mouth daily.  [name] Kitchen ipratropium-albuterol (DUONEB) 0.5-2.5 (3) MG/3ML SOLN Inhale 25mL every four to six hours as needed for cough, shortness of breath, or wheezing. Use via nebulizer.  . isosorbide mononitrate (IMDUR) 30 MG 24 hr tablet Take 1 tablet (30 mg total) by mouth daily.  [name] Kitchen KLOR-CON M20 20 MEQ tablet TAKE 2 TABLETS (40 MEQ TOTAL) BY MOUTH DAILY.  [name] Kitchen nystatin ointment (MYCOSTATIN) APPLY TO AFFECTED AREA TWICE A DAY  . oxyCODONE-acetaminophen (PERCOCET) 10-325 MG tablet Take 1-2 tablets by mouth every 6 (six) hours as  needed for pain.  . promethazine (PHENERGAN) 25 MG tablet Take 1 tablet (25 mg total) by mouth every 8 (eight) hours as needed for nausea or vomiting.  [name] Kitchen RisperiDONE (RISPERDAL PO) Take by mouth.  . rosuvastatin (CRESTOR) 40 MG tablet TAKE 1 TABLET BY MOUTH EVERY DAY  . sertraline (ZOLOFT) 100 MG tablet Take 100 mg by mouth daily.  . temazepam (RESTORIL) 15 MG capsule TK 1 C PO HS  . traZODone (DESYREL) 50 MG tablet Take 4 tablets by mouth at bedtime.  [name] Kitchen warfarin (COUMADIN) 1 MG tablet Take 1 tablet (1 mg total) by mouth daily.  [name] Kitchen warfarin (COUMADIN) 2 MG tablet Take 1 tablet (2 mg total) by mouth daily. Take 0.5 tablet (1mg ) on Sunday.   No facility-administered encounter medications on file as of 10/31/2018.     Allergies (verified) Azithromycin; Ciprofloxacin; Erythrocin [erythromycin]; Hydrocodone-acetaminophen; Hydromet [hydrocodone-homatropine]; Keflex [cephalexin]; Lipitor [atorvastatin]; Morphine and related; Niacin and related; Prednisone; and Septra [sulfamethoxazole-trimethoprim]   History: Past Medical History:  Diagnosis Date  . CHF (congestive heart failure) ([place])   . Depression   . Heart attack ([place])   . Hyperlipidemia    No past surgical history on file. Family History  Problem Relation Age of Onset  . Diabetes Father    Social History   Socioeconomic History  . Marital status: Divorced    Spouse name: Not on file  . Number of children: Not on file  .  Years of education: Not on file  . Highest education level: Not on file  Occupational History  . Not on file  Social Needs  . Financial resource strain: Not on file  . Food insecurity:    Worry: Not on file    Inability: Not on file  . Transportation needs:    Medical: Not on file    Non-medical: Not on file  Tobacco Use  . Smoking status: Current Every Day Smoker    Packs/day: 0.50    Types: Cigarettes  . Smokeless tobacco: Never Used  Substance and Sexual Activity  . Alcohol use: No  . Drug use: No   . Sexual activity: Never  Lifestyle  . Physical activity:    Days per week: Not on file    Minutes per session: Not on file  . Stress: Not on file  Relationships  . Social connections:    Talks on phone: Not on file    Gets together: Not on file    Attends religious service: Not on file    Active member of club or organization: Not on file    Attends meetings of clubs or organizations: Not on file    Relationship status: Not on file  Other Topics Concern  . Not on file  Social History Narrative  . Not on file    Tobacco Counseling Ready to quit: Not Answered Counseling given: Not Answered   Clinical Intake:                        Activities of Daily Living No flowsheet data found.   Immunizations and Health Maintenance Immunization History  Administered Date(s) Administered  . Influenza Split 09/09/2011  . Influenza,inj,Quad PF,6+ Mos 09/01/2013, 10/04/2015, 10/19/2018  . Pneumococcal Polysaccharide-23 10/04/2015   There are no preventive care reminders to display for this patient.  Patient Care Team: Lavada Mesi as PCP - General (Family Medicine)  Indicate any recent Medical Services you may have received from other than [facility] providers in the past year (date may be approximate).     Assessment:   This is a routine wellness examination for [place].Physical assessment deferred to PCP.   Hearing/Vision screen No exam data present  Dietary issues and exercise activities discussed:   Diet Breakfast: Lunch:  Dinner:       Goals   None    Depression Screen PHQ 2/9 Scores 11/11/2017 02/23/2017  PHQ - 2 Score 0 3  PHQ- 9 Score - 12    Fall Risk No flowsheet data found.  Is the patient's home free of loose throw rugs in walkways, pet beds, electrical cords, etc?   {Blank single:19197::"yes","no"}      Grab bars in the bathroom? {Blank single:19197::"yes","no"}      Handrails on the stairs?   {Blank single:19197::"yes","no"}       Adequate lighting?   {Blank single:19197::"yes","no"}  Cognitive Function:        Screening Tests Health Maintenance  Topic Date Due  . TETANUS/TDAP  08/02/2019 (Originally 02/10/1975)  . HIV Screening  08/02/2019 (Originally 02/10/1973)  . INFLUENZA VACCINE  08/15/2019 (Originally 07/14/2018)  . MAMMOGRAM  08/08/2020  . PAP SMEAR  04/28/2021  . COLONOSCOPY  07/05/2028  . Hepatitis C Screening  Completed      Plan:   ***  I have personally reviewed and noted the following in the patient's chart:   . Medical and social history . Use of alcohol, tobacco or illicit drugs  .  Current medications and supplements . Functional ability and status . Nutritional status . Physical activity . Advanced directives . List of other physicians . Hospitalizations, surgeries, and ER visits in previous 12 months . Vitals . Screenings to include cognitive, depression, and falls . Referrals and appointments  In addition, I have reviewed and discussed with patient certain preventive protocols, quality metrics, and best practice recommendations. A written personalized care plan for preventive services as well as general preventive health recommendations were provided to patient.     Joanne Chars, LPN   [id number]

## 2018-10-19 NOTE — Progress Notes (Signed)
Pt aware of change.

## 2018-10-19 NOTE — Progress Notes (Signed)
Pt in for PT/INR. Patients INR was 2.0 today. Pt reports no missed doses, no diet changes. Pt requested t flu vaccine. Vaccine given in left deltoid with no complications.  INR not to goal.  increase dose 3 mg on Wednesday, Friday, Saturday, Sunday and 2 mg on Monday, Tuesday, Thursday,  Recheck in 2 weeks.   Iran Planas PA-C

## 2018-10-31 ENCOUNTER — Ambulatory Visit: Payer: Medicare HMO

## 2018-11-04 ENCOUNTER — Ambulatory Visit (INDEPENDENT_AMBULATORY_CARE_PROVIDER_SITE_OTHER): Payer: Medicare HMO | Admitting: Physician Assistant

## 2018-11-04 DIAGNOSIS — Z952 Presence of prosthetic heart valve: Secondary | ICD-10-CM

## 2018-11-04 DIAGNOSIS — I4891 Unspecified atrial fibrillation: Secondary | ICD-10-CM

## 2018-11-04 DIAGNOSIS — Z951 Presence of aortocoronary bypass graft: Secondary | ICD-10-CM

## 2018-11-04 LAB — POCT INR: INR: 3.8 — AB (ref 2.0–3.0)

## 2018-11-04 NOTE — Progress Notes (Signed)
Pt' s INR was 3.8 will send to provider to review. Per Provider patient is to take 3 mg Monday and Fridays, and 2 mg all other days. Pt is to return in 2 weeks. Pt advised

## 2018-11-09 ENCOUNTER — Other Ambulatory Visit: Payer: Self-pay

## 2018-11-09 DIAGNOSIS — M545 Low back pain, unspecified: Secondary | ICD-10-CM

## 2018-11-09 DIAGNOSIS — G8929 Other chronic pain: Secondary | ICD-10-CM

## 2018-11-09 MED ORDER — OXYCODONE-ACETAMINOPHEN 10-325 MG PO TABS: 1.0000 | ORAL_TABLET | Freq: Four times a day (QID) | ORAL | 0 refills | Status: DC | PRN

## 2018-11-09 NOTE — Telephone Encounter (Signed)
Pt advised RX sent.

## 2018-11-09 NOTE — Telephone Encounter (Signed)
Requesting RF on pain meds  Last RF sent 10-13-18  RX pended, please advise

## 2018-11-15 DIAGNOSIS — Z952 Presence of prosthetic heart valve: Secondary | ICD-10-CM | POA: Diagnosis not present

## 2018-11-15 DIAGNOSIS — I2510 Atherosclerotic heart disease of native coronary artery without angina pectoris: Secondary | ICD-10-CM | POA: Diagnosis not present

## 2018-11-15 DIAGNOSIS — J449 Chronic obstructive pulmonary disease, unspecified: Secondary | ICD-10-CM | POA: Diagnosis not present

## 2018-11-15 DIAGNOSIS — E782 Mixed hyperlipidemia: Secondary | ICD-10-CM | POA: Diagnosis not present

## 2018-11-15 DIAGNOSIS — Z955 Presence of coronary angioplasty implant and graft: Secondary | ICD-10-CM | POA: Diagnosis not present

## 2018-11-15 DIAGNOSIS — Z951 Presence of aortocoronary bypass graft: Secondary | ICD-10-CM | POA: Diagnosis not present

## 2018-11-16 NOTE — Progress Notes (Signed)
Subjective:   Janet Joyce is a 62 y.o. female who presents for an Initial Medicare Annual Wellness Visit.  Review of Systems    No ROS.  Medicare Wellness Visit. Additional risk factors are reflected in the social history.   Cardiac Risk Factors include: dyslipidemia;smoking/ tobacco exposure;sedentary lifestyle Sleep patterns: {SX; SLEEP PATTERNS:18802: Sleeping 8-9 hours a night. Upon waking up feels rested most of the time. Wakes up 3 times a night to go to bathroom. Home Safety/Smoke Alarms: Feels safe in home. Smoke alarms in place.  Living environment; Lives with sister in 1 story home ramp in place no steps. Shower  Is a step over  [employer] and grab rails are in place. Seat Belt Safety/Bike Helmet: Wears seat belt.   Female:   Pap-  utd     Mammo- utd      Dexa scan-  ordered      CCS- utd      Objective:    Today's Vitals   11/22/18 1309  PainSc: 7    There is no height or weight on file to calculate BMI.  Advanced Directives 11/22/2018  Does Patient Have a Medical Advance Directive? Yes  Type of Advance Directive [place]  Does patient want to make changes to medical advance directive? No - Patient declined  Copy of [place] in Chart? No - copy requested    Current Medications (verified) Outpatient Encounter Medications as of 11/22/2018  Medication Sig  . ARIPiprazole (ABILIFY) 2 MG tablet 2 mg.  . aspirin 81 MG tablet Take 81 mg by mouth.  . carvedilol (COREG) 3.125 MG tablet Take 1 tablet (3.125 mg total) by mouth 2 (two) times daily.  [name] Kitchen ezetimibe (ZETIA) 10 MG tablet Take 1 tablet (10 mg total) by mouth daily.  . isosorbide mononitrate (IMDUR) 30 MG 24 hr tablet Take 1 tablet (30 mg total) by mouth daily. (Patient taking differently: Take 60 mg by mouth daily. )  . KLOR-CON M20 20 MEQ tablet TAKE 2 TABLETS (40 MEQ TOTAL) BY MOUTH DAILY.  [name] Kitchen nystatin ointment (MYCOSTATIN) APPLY TO AFFECTED AREA TWICE A DAY  .  oxyCODONE-acetaminophen (PERCOCET) 10-325 MG tablet Take 1-2 tablets by mouth every 6 (six) hours as needed for pain.  . promethazine (PHENERGAN) 25 MG tablet Take 1 tablet (25 mg total) by mouth every 8 (eight) hours as needed for nausea or vomiting.  [name] Kitchen RisperiDONE (RISPERDAL PO) Take by mouth.  . rosuvastatin (CRESTOR) 40 MG tablet TAKE 1 TABLET BY MOUTH EVERY DAY  . sertraline (ZOLOFT) 100 MG tablet Take 100 mg by mouth daily.  . temazepam (RESTORIL) 15 MG capsule TK 1 C PO HS  . traZODone (DESYREL) 50 MG tablet Take 4 tablets by mouth at bedtime.  [name] Kitchen warfarin (COUMADIN) 1 MG tablet Take 1 tablet (1 mg total) by mouth daily.  [name] Kitchen warfarin (COUMADIN) 2 MG tablet Take 1 tablet (2 mg total) by mouth daily. Take 0.5 tablet (1mg ) on Sunday.  [name] Kitchen albuterol (PROVENTIL HFA;VENTOLIN HFA) 108 (90 Base) MCG/ACT inhaler TAKE 2 PUFFS BY MOUTH EVERY 6 HOURS AS NEEDED FOR WHEEZE OR SHORTNESS OF BREATH (Patient not taking: Reported on 11/22/2018)  . ipratropium-albuterol (DUONEB) 0.5-2.5 (3) MG/3ML SOLN Inhale 56mL every four to six hours as needed for cough, shortness of breath, or wheezing. Use via nebulizer. (Patient not taking: Reported on 11/22/2018)   No facility-administered encounter medications on file as of 11/22/2018.     Allergies (verified) Azithromycin; Ciprofloxacin; Erythrocin [erythromycin]; Hydrocodone-acetaminophen;  Hydromet [hydrocodone-homatropine]; Keflex [cephalexin]; Lipitor [atorvastatin]; Morphine and related; Niacin and related; Prednisone; and Septra [sulfamethoxazole-trimethoprim]   History: Past Medical History:  Diagnosis Date  . CHF (congestive heart failure) ([place])   . Depression   . Heart attack ([place])   . Hyperlipidemia    History reviewed. No pertinent surgical history. Family History  Problem Relation Age of Onset  . Diabetes Father    Social History   Socioeconomic History  . Marital status: Divorced    Spouse name: Not on file  . Number of children: 1  . Years  of education: 24  . Highest education level: 10th grade  Occupational History  . Occupation: retired    Comment: Glass blower/designer  Social Needs  . Financial resource strain: Not hard at all  . Food insecurity:    Worry: Never true    Inability: Never true  . Transportation needs:    Medical: No    Non-medical: No  Tobacco Use  . Smoking status: Current Every Day Smoker    Packs/day: 1.00    Types: Cigarettes  . Smokeless tobacco: Never Used  Substance and Sexual Activity  . Alcohol use: No  . Drug use: No  . Sexual activity: Never  Lifestyle  . Physical activity:    Days per week: 0 days    Minutes per session: 0 min  . Stress: Not at all  Relationships  . Social connections:    Talks on phone: More than three times a week    Gets together: Once a week    Attends religious service: More than 4 times per year    Active member of club or organization: No    Attends meetings of clubs or organizations: Never    Relationship status: Divorced  Other Topics Concern  . Not on file  Social History Narrative   Does housework during the day. Drinks coffee every morning. Has chronic pain    Tobacco Counseling Ready to quit: Not Answered Counseling given: Not Answered   Clinical Intake:  Pre-visit preparation completed: Yes  Pain : 0-10 Pain Score: 7  Pain Type: Chronic pain Pain Location: Back Pain Orientation: Posterior, Lower, Lateral Pain Descriptors / Indicators: Aching, Burning, Constant, Pounding Pain Onset: More than a month ago Pain Frequency: Constant Pain Relieving Factors: medicine Effect of Pain on Daily Activities: yes effects daily acitivty  Pain Relieving Factors: medicine  Nutritional Risks: None Diabetes: No  How often do you need to have someone help you when you read instructions, pamphlets, or other written materials from your doctor or pharmacy?: 1 - Never What is the last grade level you completed in school?: 10  Interpreter Needed?:  No  Information entered by :: Orlie Dakin, LPN   Activities of Daily Living In your present state of health, do you have any difficulty performing the following activities: 11/22/2018  Hearing? N  Comment is noticing some hearing loss  Vision? N  Difficulty concentrating or making decisions? Y  Comment pt states some  Walking or climbing stairs? Y  Comment avoids steps when can  Dressing or bathing? N  Doing errands, shopping? N  Preparing Food and eating ? N  Using the Toilet? N  In the past six months, have you accidently leaked urine? Y  Comment wears a pad has this all the time  Do you have problems with loss of bowel control? N  Managing your Medications? N  Managing your Finances? N  Housekeeping or managing your Housekeeping? N  Some recent data might be hidden     Immunizations and Health Maintenance Immunization History  Administered Date(s) Administered  . Influenza Split 09/09/2011  . Influenza,inj,Quad PF,6+ Mos 09/01/2013, 10/04/2015, 10/19/2018  . Pneumococcal Polysaccharide-23 10/04/2015   There are no preventive care reminders to display for this patient.  Patient Care Team: Lavada Mesi as PCP - General (Family Medicine)  Indicate any recent Medical Services you may have received from other than [facility] providers in the past year (date may be approximate).     Assessment:   This is a routine wellness examination for Janet Joyce.Physical assessment deferred to PCP.   Hearing/Vision screen  Visual Acuity Screening   Right eye Left eye Both eyes  Without correction: 20/30 20/70 20/30   With correction:     Hearing Screening Comments: Whisper test done and pt only repeated 1 word back. Has noticed a decline in hearing  Dietary issues and exercise activities discussed: Current Exercise Habits: The patient does not participate in regular exercise at present, Exercise limited by: orthopedic condition(s);Other - see comments(chroic pain syndrome) Diet  Eats a pretty healthy diet. Patient in a rush to leave so visit ended.         Goals   None    Depression Screen PHQ 2/9 Scores 11/22/2018 11/11/2017 02/23/2017  PHQ - 2 Score 6 0 3  PHQ- 9 Score 10 - 12    Fall Risk Fall Risk  11/22/2018  Falls in the past year? 0  Follow up Falls prevention discussed    Is the patient's home free of loose throw rugs in walkways, pet beds, electrical cords, etc?   yes      Grab bars in the bathroom? yes      Handrails on the stairs?   no      Adequate lighting?   yes  Cognitive Function:     6CIT Screen 11/22/2018  What Year? 0 points  What month? 0 points  What time? 0 points  Count back from 20 0 points  Months in reverse 2 points  Repeat phrase 0 points  Total Score 2    Screening Tests Health Maintenance  Topic Date Due  . TETANUS/TDAP  08/02/2019 (Originally [birth year]1977)  . HIV Screening  08/02/2019 (Originally 02/10/1973)  . MAMMOGRAM  08/08/2020  . PAP SMEAR  04/28/2021  . COLONOSCOPY  07/05/2028  . INFLUENZA VACCINE  Completed  . Hepatitis C Screening  Completed      Plan:    Janet Joyce , Thank you for taking time to come for your Medicare Wellness Visit. I appreciate your ongoing commitment to your health goals. Please review the following plan we discussed and let me know if I can assist you in the future.  Please schedule your next medicare wellness visit with me in 1 yr.   These are the goals we discussed: Goals   None     This is a list of the screening recommended for you and due dates:  Health Maintenance  Topic Date Due  . Tetanus Vaccine  08/02/2019*  . HIV Screening  08/02/2019*  . Mammogram  08/08/2020  . Pap Smear  04/28/2021  . Colon Cancer Screening  07/05/2028  . Flu Shot  Completed  .  Hepatitis C: One time screening is recommended by Center for Disease Control  (CDC) for  adults born from [birth year] through 1965.   Completed  *Topic was postponed. The date shown is not the original due date.  I have personally reviewed and noted the following in the patient's chart:   . Medical and social history . Use of alcohol, tobacco or illicit drugs  . Current medications and supplements . Functional ability and status . Nutritional status . Physical activity . Advanced directives . List of other physicians . Hospitalizations, surgeries, and ER visits in previous 12 months . Vitals . Screenings to include cognitive, depression, and falls . Referrals and appointments  In addition, I have reviewed and discussed with patient certain preventive protocols, quality metrics, and best practice recommendations. A written personalized care plan for preventive services as well as general preventive health recommendations were provided to patient.     Joanne Chars, LPN   [id number]

## 2018-11-22 ENCOUNTER — Ambulatory Visit (INDEPENDENT_AMBULATORY_CARE_PROVIDER_SITE_OTHER): Payer: Medicare HMO | Admitting: Family Medicine

## 2018-11-22 ENCOUNTER — Ambulatory Visit (INDEPENDENT_AMBULATORY_CARE_PROVIDER_SITE_OTHER): Payer: Medicare HMO | Admitting: *Deleted

## 2018-11-22 VITALS — BP 129/66 | HR 63 | Ht 63.0 in | Wt 169.0 lb

## 2018-11-22 DIAGNOSIS — I4891 Unspecified atrial fibrillation: Secondary | ICD-10-CM

## 2018-11-22 DIAGNOSIS — Z0000 Encounter for general adult medical examination without abnormal findings: Secondary | ICD-10-CM | POA: Diagnosis not present

## 2018-11-22 DIAGNOSIS — Z952 Presence of prosthetic heart valve: Secondary | ICD-10-CM

## 2018-11-22 DIAGNOSIS — Z951 Presence of aortocoronary bypass graft: Secondary | ICD-10-CM

## 2018-11-22 LAB — POCT INR: INR: 2.5 (ref 2.0–3.0)

## 2018-11-22 NOTE — Progress Notes (Signed)
Pt in office today for INR check. Pt reports taking 3 mg Monday and Friday , and 2 mg all others. Todays INR was 2.5.

## 2018-11-22 NOTE — Patient Instructions (Signed)
Janet Joyce , Thank you for taking time to come for your Medicare Wellness Visit. I appreciate your ongoing commitment to your health goals. Please review the following plan we discussed and let me know if I can assist you in the future.  Please schedule your next medicare wellness visit with me in 1 yr.  Health Maintenance, Female Adopting a healthy lifestyle and getting preventive care can go a long way to promote health and wellness. Talk with your health care provider about what schedule of regular examinations is right for you. This is a good chance for you to check in with your provider about disease prevention and staying healthy. In between checkups, there are plenty of things you can do on your own. Experts have done a lot of research about which lifestyle changes and preventive measures are most likely to keep you healthy. Ask your health care provider for more information. Weight and diet Eat a healthy diet  Be sure to include plenty of vegetables, fruits, low-fat dairy products, and lean protein.  Do not eat a lot of foods high in solid fats, added sugars, or salt.  Get regular exercise. This is one of the most important things you can do for your health. ? Most adults should exercise for at least 150 minutes each week. The exercise should increase your heart rate and make you sweat (moderate-intensity exercise). ? Most adults should also do strengthening exercises at least twice a week. This is in addition to the moderate-intensity exercise.  Maintain a healthy weight  Body mass index (BMI) is a measurement that can be used to identify possible weight problems. It estimates body fat based on height and weight. Your health care provider can help determine your BMI and help you achieve or maintain a healthy weight.  For females 89 years of age and older: ? A BMI below 18.5 is considered underweight. ? A BMI of 18.5 to 24.9 is normal. ? A BMI of 25 to 29.9 is considered  overweight. ? A BMI of 30 and above is considered obese.  Watch levels of cholesterol and blood lipids  You should start having your blood tested for lipids and cholesterol at 60 years of age, then have this test every 5 years.  You may need to have your cholesterol levels checked more often if: ? Your lipid or cholesterol levels are high. ? You are older than 60 years of age. ? You are at high risk for heart disease.  Cancer screening Lung Cancer  Lung cancer screening is recommended for adults 36-3 years old who are at high risk for lung cancer because of a history of smoking.  A yearly low-dose CT scan of the lungs is recommended for people who: ? Currently smoke. ? Have quit within the past 15 years. ? Have at least a 30-pack-year history of smoking. A pack year is smoking an average of one pack of cigarettes a day for 1 year.  Yearly screening should continue until it has been 15 years since you quit.  Yearly screening should stop if you develop a health problem that would prevent you from having lung cancer treatment.  Breast Cancer  Practice breast self-awareness. This means understanding how your breasts normally appear and feel.  It also means doing regular breast self-exams. Let your health care provider know about any changes, no matter how small.  If you are in your 20s or 30s, you should have a clinical breast exam (CBE) by a health care provider  every 1-3 years as part of a regular health exam.  If you are 41 or older, have a CBE every year. Also consider having a breast X-ray (mammogram) every year.  If you have a family history of breast cancer, talk to your health care provider about genetic screening.  If you are at high risk for breast cancer, talk to your health care provider about having an MRI and a mammogram every year.  Breast cancer gene (BRCA) assessment is recommended for women who have family members with BRCA-related cancers. BRCA-related cancers  include: ? Breast. ? Ovarian. ? Tubal. ? Peritoneal cancers.  Results of the assessment will determine the need for genetic counseling and BRCA1 and BRCA2 testing.  Cervical Cancer Your health care provider may recommend that you be screened regularly for cancer of the pelvic organs (ovaries, uterus, and vagina). This screening involves a pelvic examination, including checking for microscopic changes to the surface of your cervix (Pap test). You may be encouraged to have this screening done every 3 years, beginning at age 65.  For women ages 44-65, health care providers may recommend pelvic exams and Pap testing every 3 years, or they may recommend the Pap and pelvic exam, combined with testing for human papilloma virus (HPV), every 5 years. Some types of HPV increase your risk of cervical cancer. Testing for HPV may also be done on women of any age with unclear Pap test results.  Other health care providers may not recommend any screening for nonpregnant women who are considered low risk for pelvic cancer and who do not have symptoms. Ask your health care provider if a screening pelvic exam is right for you.  If you have had past treatment for cervical cancer or a condition that could lead to cancer, you need Pap tests and screening for cancer for at least 20 years after your treatment. If Pap tests have been discontinued, your risk factors (such as having a new sexual partner) need to be reassessed to determine if screening should resume. Some women have medical problems that increase the chance of getting cervical cancer. In these cases, your health care provider may recommend more frequent screening and Pap tests.  Colorectal Cancer  This type of cancer can be detected and often prevented.  Routine colorectal cancer screening usually begins at 60 years of age and continues through 60 years of age.  Your health care provider may recommend screening at an earlier age if you have risk factors  for colon cancer.  Your health care provider may also recommend using home test kits to check for hidden blood in the stool.  A small camera at the end of a tube can be used to examine your colon directly (sigmoidoscopy or colonoscopy). This is done to check for the earliest forms of colorectal cancer.  Routine screening usually begins at age 19.  Direct examination of the colon should be repeated every 5-10 years through 60 years of age. However, you may need to be screened more often if early forms of precancerous polyps or small growths are found.  Skin Cancer  Check your skin from head to toe regularly.  Tell your health care provider about any new moles or changes in moles, especially if there is a change in a mole's shape or color.  Also tell your health care provider if you have a mole that is larger than the size of a pencil eraser.  Always use sunscreen. Apply sunscreen liberally and repeatedly throughout the day.  Protect yourself by wearing long sleeves, pants, a wide-brimmed hat, and sunglasses whenever you are outside.  Heart disease, diabetes, and high blood pressure  High blood pressure causes heart disease and increases the risk of stroke. High blood pressure is more likely to develop in: ? People who have blood pressure in the high end of the normal range (130-139/85-89 mm Hg). ? People who are overweight or obese. ? People who are African American.  If you are 57-39 years of age, have your blood pressure checked every 3-5 years. If you are 19 years of age or older, have your blood pressure checked every year. You should have your blood pressure measured twice-once when you are at a hospital or clinic, and once when you are not at a hospital or clinic. Record the average of the two measurements. To check your blood pressure when you are not at a hospital or clinic, you can use: ? An automated blood pressure machine at a pharmacy. ? A home blood pressure monitor.  If  you are between 19 years and 38 years old, ask your health care provider if you should take aspirin to prevent strokes.  Have regular diabetes screenings. This involves taking a blood sample to check your fasting blood sugar level. ? If you are at a normal weight and have a low risk for diabetes, have this test once every three years after 60 years of age. ? If you are overweight and have a high risk for diabetes, consider being tested at a younger age or more often. Preventing infection Hepatitis B  If you have a higher risk for hepatitis B, you should be screened for this virus. You are considered at high risk for hepatitis B if: ? You were born in a country where hepatitis B is common. Ask your health care provider which countries are considered high risk. ? Your parents were born in a high-risk country, and you have not been immunized against hepatitis B (hepatitis B vaccine). ? You have HIV or AIDS. ? You use needles to inject street drugs. ? You live with someone who has hepatitis B. ? You have had sex with someone who has hepatitis B. ? You get hemodialysis treatment. ? You take certain medicines for conditions, including cancer, organ transplantation, and autoimmune conditions.  Hepatitis C  Blood testing is recommended for: ? Everyone born from [birth year] through 1963. ? Anyone with known risk factors for hepatitis C.  Sexually transmitted infections (STIs)  You should be screened for sexually transmitted infections (STIs) including gonorrhea and chlamydia if: ? You are sexually active and are younger than 60 years of age. ? You are older than 60 years of age and your health care provider tells you that you are at risk for this type of infection. ? Your sexual activity has changed since you were last screened and you are at an increased risk for chlamydia or gonorrhea. Ask your health care provider if you are at risk.  If you do not have HIV, but are at risk, it may be recommended  that you take a prescription medicine daily to prevent HIV infection. This is called pre-exposure prophylaxis (PrEP). You are considered at risk if: ? You are sexually active and do not regularly use condoms or know the HIV status of your partner(s). ? You take drugs by injection. ? You are sexually active with a partner who has HIV.  Talk with your health care provider about whether you are at high risk of  being infected with HIV. If you choose to begin PrEP, you should first be tested for HIV. You should then be tested every 3 months for as long as you are taking PrEP. Pregnancy  If you are premenopausal and you may become pregnant, ask your health care provider about preconception counseling.  If you may become pregnant, take 400 to 800 micrograms (mcg) of folic acid every day.  If you want to prevent pregnancy, talk to your health care provider about birth control (contraception). Osteoporosis and menopause  Osteoporosis is a disease in which the bones lose minerals and strength with aging. This can result in serious bone fractures. Your risk for osteoporosis can be identified using a bone density scan.  If you are 63 years of age or older, or if you are at risk for osteoporosis and fractures, ask your health care provider if you should be screened.  Ask your health care provider whether you should take a calcium or vitamin D supplement to lower your risk for osteoporosis.  Menopause may have certain physical symptoms and risks.  Hormone replacement therapy may reduce some of these symptoms and risks. Talk to your health care provider about whether hormone replacement therapy is right for you. Follow these instructions at home:  Schedule regular health, dental, and eye exams.  Stay current with your immunizations.  Do not use any tobacco products including cigarettes, chewing tobacco, or electronic cigarettes.  If you are pregnant, do not drink alcohol.  If you are  breastfeeding, limit how much and how often you drink alcohol.  Limit alcohol intake to no more than 1 drink per day for nonpregnant women. One drink equals 12 ounces of beer, 5 ounces of wine, or 1 ounces of hard liquor.  Do not use street drugs.  Do not share needles.  Ask your health care provider for help if you need support or information about quitting drugs.  Tell your health care provider if you often feel depressed.  Tell your health care provider if you have ever been abused or do not feel safe at home. This information is not intended to replace advice given to you by your health care provider. Make sure you discuss any questions you have with your health care provider. Document Released: 06/15/2011 Document Revised: 05/07/2016 Document Reviewed: 09/03/2015 Elsevier Interactive Patient Education  [employer].

## 2018-11-24 ENCOUNTER — Telehealth: Payer: Self-pay | Admitting: Family Medicine

## 2018-11-24 MED ORDER — FLUTICASONE PROPIONATE 50 MCG/ACT NA SUSP: 2.0000 | Freq: Every day | NASAL | 3 refills | Status: AC

## 2018-11-24 NOTE — Telephone Encounter (Signed)
Patient calls and wants to know if you could call in something for allergies for her; sneezing, congested. With her heart valve problems she doesn't know which ones would be ok to take. Please advise

## 2018-11-24 NOTE — Telephone Encounter (Signed)
Pt notified that med sent to pharmacy. KG LPN

## 2018-11-24 NOTE — Telephone Encounter (Signed)
flonase nasal spray sent  Can also add OTC Claritin, Zyrtec, Allegra or any of their generic equivalents

## 2018-12-08 ENCOUNTER — Telehealth: Payer: Self-pay

## 2018-12-09 ENCOUNTER — Other Ambulatory Visit: Payer: Self-pay | Admitting: Physician Assistant

## 2018-12-09 ENCOUNTER — Other Ambulatory Visit: Payer: Self-pay

## 2018-12-09 DIAGNOSIS — G8929 Other chronic pain: Secondary | ICD-10-CM

## 2018-12-09 DIAGNOSIS — M545 Low back pain: Principal | ICD-10-CM

## 2018-12-09 MED ORDER — OXYCODONE-ACETAMINOPHEN 10-325 MG PO TABS: 1.0000 | ORAL_TABLET | Freq: Four times a day (QID) | ORAL | 0 refills | Status: DC | PRN

## 2018-12-09 NOTE — Telephone Encounter (Signed)
Janet Joyce requests a refill on Oxycodone. Next visit scheduled for 12/19/2018.

## 2018-12-12 ENCOUNTER — Ambulatory Visit: Payer: Medicare HMO

## 2018-12-12 ENCOUNTER — Telehealth: Payer: Self-pay

## 2018-12-12 NOTE — Telephone Encounter (Signed)
Thanks, pt advised

## 2018-12-12 NOTE — Telephone Encounter (Signed)
Sure

## 2018-12-12 NOTE — Telephone Encounter (Signed)
Pt called and cancelled nurse visit for INR today and wants to know if OK to wait until her appt on Monday 12/19/18 to have checked  Please advise

## 2018-12-16 NOTE — Telephone Encounter (Signed)
No action needed at this time.

## 2018-12-19 ENCOUNTER — Ambulatory Visit (INDEPENDENT_AMBULATORY_CARE_PROVIDER_SITE_OTHER): Payer: Medicare HMO | Admitting: Physician Assistant

## 2018-12-19 ENCOUNTER — Encounter: Payer: Self-pay | Admitting: Physician Assistant

## 2018-12-19 VITALS — BP 150/64 | HR 64 | Temp 97.3°F | Ht 63.0 in | Wt 169.0 lb

## 2018-12-19 DIAGNOSIS — Z79899 Other long term (current) drug therapy: Secondary | ICD-10-CM

## 2018-12-19 DIAGNOSIS — Z5181 Encounter for therapeutic drug level monitoring: Secondary | ICD-10-CM

## 2018-12-19 DIAGNOSIS — I2510 Atherosclerotic heart disease of native coronary artery without angina pectoris: Secondary | ICD-10-CM

## 2018-12-19 DIAGNOSIS — I10 Essential (primary) hypertension: Secondary | ICD-10-CM | POA: Diagnosis not present

## 2018-12-19 DIAGNOSIS — M549 Dorsalgia, unspecified: Secondary | ICD-10-CM | POA: Diagnosis not present

## 2018-12-19 DIAGNOSIS — G8929 Other chronic pain: Secondary | ICD-10-CM

## 2018-12-19 DIAGNOSIS — Z7901 Long term (current) use of anticoagulants: Secondary | ICD-10-CM

## 2018-12-19 NOTE — Progress Notes (Signed)
Subjective:    Patient ID: Janet Joyce, female    DOB: 07/27/1956, 63 y.o.   MRN: [id number]  HPI  Pt is a 63 yo female with hx of Heart attack, atrial fibrillation, CAD, HTN, GERD, chronic pain who presents to the clinic for medication refills.   Pt is doing well. No concerns today. Taking medication as directed. Taking coumadin 3mg  M and F and 2mg  other days.   No CP, palpitaitons, headaches, vision changes, SOB, wheezing.   .. Active Ambulatory Problems    Diagnosis Date Noted  . CAD (coronary artery disease) 01/31/2013  . Hyperlipidemia LDL goal <70 01/31/2013  . H/O aortic valve replacement 01/31/2013  . GERD (gastroesophageal reflux disease) 01/31/2013  . S/P CABG (coronary artery bypass graft) 01/31/2013  . Anxiety and depression 01/31/2013  . IBS (irritable bowel syndrome) 01/31/2013  . Status post lung surgery 01/31/2013  . Chronic back pain 01/31/2013  . Tobacco use disorder 01/31/2013  . Bilateral knee pain 01/31/2013  . Hypertriglyceridemia 02/07/2013  . Atrial fibrillation ([place]) 02/20/2013  . Prediabetes 02/21/2013  . Encounter for monitoring Coumadin therapy 03/10/2013  . Urinary incontinence 04/21/2013  . Overactive bladder 04/21/2013  . Lumbago 05/16/2013  . Orthostatic hypotension 07/04/2013  . Essential hypertension, benign 08/04/2013  . Iliotibial band syndrome 11/06/2013  . Solitary pulmonary nodule 09/03/2014  . Acute bronchitis 07/03/2015  . Elevated INR 07/03/2015  . Allergic conjunctivitis 11/01/2015  . Humeral head fracture 11/11/2015  . Decreased diffusion capacity of lung 07/21/2017  . SOB (shortness of breath) 07/28/2017  . Elevated TSH 08/19/2017  . Tricuspid regurgitation 11/30/2017  . Grade II diastolic dysfunction 53/29/9242  . Pancreatic cyst 01/07/2018  . Fungal toenail infection 01/30/2018  . Ingrown toenail of left foot 01/30/2018  . Encounter for chronic pain management 04/08/2018  . Edema 04/27/2018  . Positive colorectal  cancer screening using Cologuard test 06/24/2018  . Hair loss 06/24/2018   Resolved Ambulatory Problems    Diagnosis Date Noted  . Hypokalemia 01/31/2013  . COPD exacerbation ([place]) 08/24/2014  . Emphysema of lung ([place]) 09/03/2014  . Candidal intertrigo 01/28/2017   Past Medical History:  Diagnosis Date  . CHF (congestive heart failure) ([place])   . Depression   . Heart attack ([place])   . Hyperlipidemia     Review of Systems  All other systems reviewed and are negative.      Objective:   Physical Exam Vitals signs reviewed.  Constitutional:      Appearance: Normal appearance. She is obese.  HENT:     Head: Normocephalic and atraumatic.  Cardiovascular:     Rate and Rhythm: Normal rate and regular rhythm.     Comments: mechanical heart.  Pulmonary:     Effort: Pulmonary effort is normal.     Breath sounds: Normal breath sounds.  Neurological:     General: No focal deficit present.     Mental Status: She is alert and oriented to person, place, and time.  Psychiatric:        Mood and Affect: Mood normal.        Behavior: Behavior normal.           Assessment & Plan:  [name] KitchenMarland KitchenBrittanee was seen today for medication management.  Diagnoses and all orders for this visit:  Coronary artery disease involving native heart without angina pectoris, unspecified vessel or lesion type -     Lipid Panel w/reflex Direct LDL  Essential hypertension, benign -     COMPLETE METABOLIC PANEL  WITH GFR  Medication management -     Lipid Panel w/reflex Direct LDL -     COMPLETE METABOLIC PANEL WITH GFR  Chronic back pain, unspecified back location, unspecified back pain laterality  Encounter for monitoring Coumadin therapy -     POCT INR   .[name] Kitchen Results for orders placed or performed in visit on 12/19/18  POCT INR  Result Value Ref Range   INR 2.0 2.0 - 3.0   Increase coumadin to 3mg  M, W, F and all other days 2mg . Follow up in 2 weeks.   Needs labs. Does not need refills right now.    Pain contract updated.  UDS up to date.  [place] controlled substance database reviewed with no concerns.  No need for refill right now. [place] for 3 months.

## 2018-12-19 NOTE — Patient Instructions (Signed)
Increase coumadin to 3mg  on Monday, Wednesday, Friday and 2mg  all other days.  Recheck in 2 weeks.

## 2018-12-20 ENCOUNTER — Encounter: Payer: Self-pay | Admitting: Physician Assistant

## 2018-12-20 LAB — POCT INR: INR: 2 (ref 2.0–3.0)

## 2018-12-29 ENCOUNTER — Telehealth: Payer: Self-pay

## 2018-12-29 DIAGNOSIS — R69 Illness, unspecified: Secondary | ICD-10-CM | POA: Diagnosis not present

## 2018-12-29 DIAGNOSIS — K862 Cyst of pancreas: Secondary | ICD-10-CM

## 2018-12-29 NOTE — Telephone Encounter (Signed)
Called patient and she would like to have the follow-up MRI done, but she would like to have it done in [place] if that would be possible. Thanks!

## 2018-12-29 NOTE — Telephone Encounter (Signed)
-----   Message from Donella Stade, [place] sent at 11/28/2018  2:12 PM EST ----- We need follow up MRI on pancreatic cyst. Ok to order?  ----- Message ----- From: Donella Stade, PA-C Sent: 01/07/2018   5:00 PM EST To: Donella Stade, PA-C  Follow up abdominal MRI on pancreatic cyst.

## 2018-12-30 NOTE — Addendum Note (Signed)
Addended by: Donella Stade on: 12/30/2018 05:40 PM   Modules accepted: Orders

## 2018-12-30 NOTE — Telephone Encounter (Signed)
Done

## 2019-01-02 ENCOUNTER — Ambulatory Visit: Payer: Medicare HMO

## 2019-01-02 ENCOUNTER — Ambulatory Visit (INDEPENDENT_AMBULATORY_CARE_PROVIDER_SITE_OTHER): Payer: Medicare HMO | Admitting: Family Medicine

## 2019-01-02 DIAGNOSIS — Z952 Presence of prosthetic heart valve: Secondary | ICD-10-CM

## 2019-01-02 DIAGNOSIS — I2510 Atherosclerotic heart disease of native coronary artery without angina pectoris: Secondary | ICD-10-CM | POA: Diagnosis not present

## 2019-01-02 DIAGNOSIS — I4891 Unspecified atrial fibrillation: Secondary | ICD-10-CM

## 2019-01-02 DIAGNOSIS — Z951 Presence of aortocoronary bypass graft: Secondary | ICD-10-CM

## 2019-01-02 DIAGNOSIS — I10 Essential (primary) hypertension: Secondary | ICD-10-CM | POA: Diagnosis not present

## 2019-01-02 DIAGNOSIS — Z79899 Other long term (current) drug therapy: Secondary | ICD-10-CM | POA: Diagnosis not present

## 2019-01-02 LAB — POCT INR: INR: 2.5 (ref 2.0–3.0)

## 2019-01-02 NOTE — Progress Notes (Signed)
Pt advised to stay on same dose and recheck 4 weeks. Pt states she will call back to schedule. No further needs at this time

## 2019-01-03 LAB — COMPLETE METABOLIC PANEL WITH GFR
AG RATIO: 1.9 (calc) (ref 1.0–2.5)
ALT: 12 U/L (ref 6–29)
AST: 22 U/L (ref 10–35)
Albumin: 3.9 g/dL (ref 3.6–5.1)
Alkaline phosphatase (APISO): 73 U/L (ref 33–130)
BUN: 11 mg/dL (ref 7–25)
CALCIUM: 9 mg/dL (ref 8.6–10.4)
CO2: 29 mmol/L (ref 20–32)
Chloride: 107 mmol/L (ref 98–110)
Creat: 0.76 mg/dL (ref 0.50–0.99)
GFR, Est African American: 99 mL/min/{1.73_m2} (ref >=60)
GFR, Est Non African American: 85 mL/min/{1.73_m2} (ref >=60)
Globulin: 2.1 g/dL (calc) (ref 1.9–3.7)
Glucose, Bld: 105 mg/dL — ABNORMAL HIGH (ref 65–99)
Potassium: 4.2 mmol/L (ref 3.5–5.3)
Sodium: 143 mmol/L (ref 135–146)
Total Bilirubin: 0.6 mg/dL (ref 0.2–1.2)
Total Protein: 6 g/dL — ABNORMAL LOW (ref 6.1–8.1)

## 2019-01-03 LAB — LIPID PANEL W/REFLEX DIRECT LDL
Cholesterol: 124 mg/dL (ref <200)
HDL: 41 mg/dL — ABNORMAL LOW (ref >50)
LDL Cholesterol (Calc): 59 mg/dL (calc)
Non-HDL Cholesterol (Calc): 83 mg/dL (calc) (ref <130)
Total CHOL/HDL Ratio: 3 (calc) (ref <5.0)
Triglycerides: 154 mg/dL — ABNORMAL HIGH (ref <150)

## 2019-01-03 NOTE — Progress Notes (Signed)
Call pt: LDL great. HDL increasing some which is good. Protein dropped some in labs. Make sure you are getting protein in your diet.

## 2019-01-09 ENCOUNTER — Other Ambulatory Visit: Payer: Self-pay

## 2019-01-09 DIAGNOSIS — G8929 Other chronic pain: Secondary | ICD-10-CM

## 2019-01-09 DIAGNOSIS — M545 Low back pain: Principal | ICD-10-CM

## 2019-01-09 MED ORDER — OXYCODONE-ACETAMINOPHEN 10-325 MG PO TABS: 1.0000 | ORAL_TABLET | Freq: Four times a day (QID) | ORAL | 0 refills | Status: DC | PRN

## 2019-01-10 ENCOUNTER — Telehealth: Payer: Self-pay

## 2019-01-10 NOTE — Telephone Encounter (Signed)
-----   Message from Donella Stade, [place] sent at 01/09/2019 12:07 PM EST ----- Regarding: FW: Scheduling Can we get in touch with patient? To see if we can get her to call them?  ----- Message ----- From: Max Sane A Sent: 01/09/2019  11:03 AM EST To: Donella Stade, PA-C Subject: [place],   I have called the above patient three times and the phone rings busy and then suddenly disconnects. Is there anyway yall could get in contact with the patient and have her call us back to schedule her MRI appointment?   Thank you!  Destiny - [facility]

## 2019-01-10 NOTE — Telephone Encounter (Signed)
Called pt and advised her to call [facility] dept and ask for Destiny.

## 2019-01-18 ENCOUNTER — Encounter: Payer: Self-pay | Admitting: Physician Assistant

## 2019-01-18 ENCOUNTER — Other Ambulatory Visit: Payer: Self-pay | Admitting: Physician Assistant

## 2019-01-18 ENCOUNTER — Ambulatory Visit: Payer: Medicare HMO | Admitting: Physician Assistant

## 2019-01-18 VITALS — BP 120/78 | HR 70 | Temp 97.6°F | Ht 63.0 in | Wt 168.0 lb

## 2019-01-18 DIAGNOSIS — R52 Pain, unspecified: Secondary | ICD-10-CM

## 2019-01-18 DIAGNOSIS — R6883 Chills (without fever): Secondary | ICD-10-CM | POA: Diagnosis not present

## 2019-01-18 DIAGNOSIS — M940 Chondrocostal junction syndrome [Tietze]: Secondary | ICD-10-CM

## 2019-01-18 DIAGNOSIS — J40 Bronchitis, not specified as acute or chronic: Principal | ICD-10-CM

## 2019-01-18 DIAGNOSIS — R059 Cough, unspecified: Secondary | ICD-10-CM

## 2019-01-18 DIAGNOSIS — J029 Acute pharyngitis, unspecified: Secondary | ICD-10-CM

## 2019-01-18 DIAGNOSIS — R791 Abnormal coagulation profile: Secondary | ICD-10-CM

## 2019-01-18 DIAGNOSIS — J329 Chronic sinusitis, unspecified: Secondary | ICD-10-CM

## 2019-01-18 DIAGNOSIS — R05 Cough: Secondary | ICD-10-CM

## 2019-01-18 LAB — POCT INFLUENZA A/B
Influenza A, POC: NEGATIVE
Influenza B, POC: NEGATIVE

## 2019-01-18 LAB — POCT INR: INR: 5.8 — AB (ref 2.0–3.0)

## 2019-01-18 LAB — POCT RAPID STREP A (OFFICE): Rapid Strep A Screen: NEGATIVE

## 2019-01-18 MED ORDER — METHYLPREDNISOLONE ACETATE 80 MG/ML IJ SUSP
80.0000 mg | Freq: Once | INTRAMUSCULAR | Status: AC
  Administered 2019-01-18: 80 mg via INTRAMUSCULAR

## 2019-01-18 MED ORDER — DOXYCYCLINE HYCLATE 100 MG PO TABS: 100.0000 mg | ORAL_TABLET | Freq: Two times a day (BID) | ORAL | 0 refills | Status: DC

## 2019-01-18 MED ORDER — METHYLPREDNISOLONE SODIUM SUCC 125 MG IJ SOLR
125.0000 mg | Freq: Once | INTRAMUSCULAR | Status: AC
  Administered 2019-01-18: 125 mg via INTRAMUSCULAR

## 2019-01-18 MED ORDER — BENZONATATE 200 MG PO CAPS: 200.0000 mg | ORAL_CAPSULE | Freq: Two times a day (BID) | ORAL | 0 refills | Status: DC | PRN

## 2019-01-18 NOTE — Progress Notes (Signed)
Subjective:    Patient ID: Janet Joyce, female    DOB: [birth year], 63 y.o.   MRN: [id number]  HPI  Pt is a 63 yo female with atrial fibrillation, CAD, daily smoker on daily anticoagulation who presents to the clinic with one week of productive cough, weakness, fatigue, sinus pressure, ear pain, SOB. She is always scared to take anything OTC with her coumadin. She is currently taking 3mg  of coumadin every M, W, F and 2mg  all other days. She does feel like her body is aching and having chills. She has not taken her temperature. She is concerned because her chest hurts bad when she coughs.   .. Active Ambulatory Problems    Diagnosis Date Noted  . CAD (coronary artery disease) 01/31/2013  . Hyperlipidemia LDL goal <70 01/31/2013  . H/O aortic valve replacement 01/31/2013  . GERD (gastroesophageal reflux disease) 01/31/2013  . S/P CABG (coronary artery bypass graft) 01/31/2013  . Anxiety and depression 01/31/2013  . IBS (irritable bowel syndrome) 01/31/2013  . Status post lung surgery 01/31/2013  . Chronic back pain 01/31/2013  . Tobacco use disorder 01/31/2013  . Bilateral knee pain 01/31/2013  . Hypertriglyceridemia 02/07/2013  . Atrial fibrillation ([place]) 02/20/2013  . Prediabetes 02/21/2013  . Encounter for monitoring Coumadin therapy 03/10/2013  . Urinary incontinence 04/21/2013  . Overactive bladder 04/21/2013  . Lumbago 05/16/2013  . Orthostatic hypotension 07/04/2013  . Essential hypertension, benign 08/04/2013  . Iliotibial band syndrome 11/06/2013  . Solitary pulmonary nodule 09/03/2014  . Acute bronchitis 07/03/2015  . Elevated INR 07/03/2015  . Allergic conjunctivitis 11/01/2015  . Humeral head fracture 11/11/2015  . Decreased diffusion capacity of lung 07/21/2017  . SOB (shortness of breath) 07/28/2017  . Elevated TSH 08/19/2017  . Tricuspid regurgitation 11/30/2017  . Grade II diastolic dysfunction 51/70/0174  . Pancreatic cyst 01/07/2018  . Fungal toenail  infection 01/30/2018  . Ingrown toenail of left foot 01/30/2018  . Encounter for chronic pain management 04/08/2018  . Edema 04/27/2018  . Positive colorectal cancer screening using Cologuard test 06/24/2018  . Hair loss 06/24/2018   Resolved Ambulatory Problems    Diagnosis Date Noted  . Hypokalemia 01/31/2013  . COPD exacerbation ([place]) 08/24/2014  . Emphysema of lung ([place]) 09/03/2014  . Candidal intertrigo 01/28/2017   Past Medical History:  Diagnosis Date  . CHF (congestive heart failure) ([place])   . Depression   . Heart attack ([place])   . Hyperlipidemia         Review of Systems  All other systems reviewed and are negative.      Objective:   Physical Exam Vitals signs reviewed.  Constitutional:      Appearance: She is well-developed.  HENT:     Head: Normocephalic and atraumatic.     Comments: Tenderness over sinuses to palpation, frontal and maxillary.     Right Ear: A middle ear effusion is present. Tympanic membrane is erythematous.     Left Ear: Tympanic membrane is erythematous.     Nose: Congestion present.     Mouth/Throat:     Pharynx: Uvula midline. Posterior oropharyngeal erythema present. No uvula swelling.     Tonsils: No tonsillar exudate or tonsillar abscesses. Swelling: 0 on the right. 0 on the left.  Eyes:     Conjunctiva/sclera: Conjunctivae normal.     Pupils: Pupils are equal, round, and reactive to light.  Neck:     Musculoskeletal: Normal range of motion.  Cardiovascular:     Rate and Rhythm:  Normal rate and regular rhythm.  Pulmonary:     Effort: Pulmonary effort is normal.     Comments: Tenderness to palpation upper chest, bilaterally.  Lymphadenopathy:     Cervical: Cervical adenopathy present.  Neurological:     Mental Status: She is alert.  Psychiatric:        Mood and Affect: Mood normal.           Assessment & Plan:  [name] KitchenMarland KitchenTaneeka was seen today for generalized body aches and sore throat.  Diagnoses and all orders for this  visit:  Sinobronchitis -     doxycycline (VIBRA-TABS) 100 MG tablet; Take 1 tablet (100 mg total) by mouth 2 (two) times daily. -     methylPREDNISolone sodium succinate (SOLU-MEDROL) 125 mg/2 mL injection 125 mg -     methylPREDNISolone acetate (DEPO-MEDROL) injection 80 mg  Elevated INR -     Protime-INR -     POCT INR  Chills -     POCT Influenza A/B -     POCT rapid strep A  Sore throat -     POCT Influenza A/B -     POCT rapid strep A  Body aches -     POCT Influenza A/B -     POCT rapid strep A  Cough -     benzonatate (TESSALON) 200 MG capsule; Take 1 capsule (200 mg total) by mouth 2 (two) times daily as needed for cough.  Costochondritis -     benzonatate (TESSALON) 200 MG capsule; Take 1 capsule (200 mg total) by mouth 2 (two) times daily as needed for cough. -     methylPREDNISolone sodium succinate (SOLU-MEDROL) 125 mg/2 mL injection 125 mg -     methylPREDNISolone acetate (DEPO-MEDROL) injection 80 mg   .. Results for orders placed or performed in visit on 01/18/19  POCT INR  Result Value Ref Range   INR 5.8 (A) 2.0 - 3.0  POCT Influenza A/B  Result Value Ref Range   Influenza A, POC Negative Negative   Influenza B, POC Negative Negative  POCT rapid strep A  Result Value Ref Range   Rapid Strep A Screen Negative Negative   Strep and flu negative.  Seems like URI due to duration turning into sinusitis and bronchitis.  Pt does not tolerate oral steroids. Depo medrol and solumedrol given in office today.  Doxy given fot 10 days. She has tolerated this well in past.  Her CP seems to be costochondritis from coughing. Tessalon pearls given to use as needed. HO given.   Her INR is TOO high. Sent to lab for blood confirmation. Hold today and tomorrow. Start 2mg  every day and follow up in one week. Likely will need to increase back up when she is not sick and not on abx.

## 2019-01-18 NOTE — Patient Instructions (Addendum)
Delsym OTC cough drop.   Start doxycycline.   1 week recheck.

## 2019-01-19 LAB — PROTIME-INR
INR: 4.6 — ABNORMAL HIGH
PROTHROMBIN TIME: 43.4 s — AB (ref 9.0–11.5)

## 2019-01-20 ENCOUNTER — Telehealth: Payer: Self-pay

## 2019-01-20 ENCOUNTER — Encounter: Payer: Self-pay | Admitting: Physician Assistant

## 2019-01-20 NOTE — Telephone Encounter (Signed)
Janet Joyce called to report Janet Joyce called during lunch and was short of breath and wheezing. Her sister said she looked very pale. The [facility] Nurse wanted to know if she should go to the ED. I advised her to send her to the ED.

## 2019-01-20 NOTE — Telephone Encounter (Signed)
Yes ED

## 2019-01-21 ENCOUNTER — Ambulatory Visit (HOSPITAL_BASED_OUTPATIENT_CLINIC_OR_DEPARTMENT_OTHER): Payer: Medicare HMO

## 2019-01-21 DIAGNOSIS — E876 Hypokalemia: Secondary | ICD-10-CM | POA: Diagnosis not present

## 2019-01-21 DIAGNOSIS — Z79899 Other long term (current) drug therapy: Secondary | ICD-10-CM | POA: Diagnosis not present

## 2019-01-21 DIAGNOSIS — I081 Rheumatic disorders of both mitral and tricuspid valves: Secondary | ICD-10-CM | POA: Diagnosis not present

## 2019-01-21 DIAGNOSIS — K5900 Constipation, unspecified: Secondary | ICD-10-CM | POA: Diagnosis not present

## 2019-01-21 DIAGNOSIS — I480 Paroxysmal atrial fibrillation: Secondary | ICD-10-CM | POA: Diagnosis not present

## 2019-01-21 DIAGNOSIS — I10 Essential (primary) hypertension: Secondary | ICD-10-CM | POA: Diagnosis not present

## 2019-01-21 DIAGNOSIS — K259 Gastric ulcer, unspecified as acute or chronic, without hemorrhage or perforation: Secondary | ICD-10-CM | POA: Diagnosis not present

## 2019-01-21 DIAGNOSIS — I519 Heart disease, unspecified: Secondary | ICD-10-CM | POA: Diagnosis not present

## 2019-01-21 DIAGNOSIS — H547 Unspecified visual loss: Secondary | ICD-10-CM | POA: Diagnosis not present

## 2019-01-21 DIAGNOSIS — Z954 Presence of other heart-valve replacement: Secondary | ICD-10-CM | POA: Diagnosis not present

## 2019-01-21 DIAGNOSIS — R05 Cough: Secondary | ICD-10-CM | POA: Diagnosis not present

## 2019-01-21 DIAGNOSIS — R69 Illness, unspecified: Secondary | ICD-10-CM | POA: Diagnosis not present

## 2019-01-21 DIAGNOSIS — K921 Melena: Secondary | ICD-10-CM | POA: Diagnosis not present

## 2019-01-21 DIAGNOSIS — K250 Acute gastric ulcer with hemorrhage: Secondary | ICD-10-CM | POA: Diagnosis not present

## 2019-01-21 DIAGNOSIS — J449 Chronic obstructive pulmonary disease, unspecified: Secondary | ICD-10-CM | POA: Diagnosis not present

## 2019-01-21 DIAGNOSIS — M549 Dorsalgia, unspecified: Secondary | ICD-10-CM | POA: Diagnosis not present

## 2019-01-21 DIAGNOSIS — Z952 Presence of prosthetic heart valve: Secondary | ICD-10-CM | POA: Diagnosis not present

## 2019-01-21 DIAGNOSIS — R0602 Shortness of breath: Secondary | ICD-10-CM | POA: Diagnosis not present

## 2019-01-21 DIAGNOSIS — R062 Wheezing: Secondary | ICD-10-CM | POA: Diagnosis not present

## 2019-01-21 DIAGNOSIS — Z955 Presence of coronary angioplasty implant and graft: Secondary | ICD-10-CM | POA: Diagnosis not present

## 2019-01-21 DIAGNOSIS — K922 Gastrointestinal hemorrhage, unspecified: Secondary | ICD-10-CM | POA: Diagnosis not present

## 2019-01-21 DIAGNOSIS — D62 Acute posthemorrhagic anemia: Secondary | ICD-10-CM | POA: Diagnosis not present

## 2019-01-21 DIAGNOSIS — Z7901 Long term (current) use of anticoagulants: Secondary | ICD-10-CM | POA: Diagnosis not present

## 2019-01-21 DIAGNOSIS — K3189 Other diseases of stomach and duodenum: Secondary | ICD-10-CM | POA: Diagnosis not present

## 2019-01-21 DIAGNOSIS — R112 Nausea with vomiting, unspecified: Secondary | ICD-10-CM | POA: Diagnosis not present

## 2019-01-21 DIAGNOSIS — R791 Abnormal coagulation profile: Secondary | ICD-10-CM | POA: Diagnosis not present

## 2019-01-21 DIAGNOSIS — R9431 Abnormal electrocardiogram [ECG] [EKG]: Secondary | ICD-10-CM | POA: Diagnosis not present

## 2019-01-21 DIAGNOSIS — I517 Cardiomegaly: Secondary | ICD-10-CM | POA: Diagnosis not present

## 2019-01-21 DIAGNOSIS — Z951 Presence of aortocoronary bypass graft: Secondary | ICD-10-CM | POA: Diagnosis not present

## 2019-01-21 DIAGNOSIS — R531 Weakness: Secondary | ICD-10-CM | POA: Diagnosis not present

## 2019-01-21 DIAGNOSIS — D649 Anemia, unspecified: Secondary | ICD-10-CM | POA: Diagnosis not present

## 2019-01-25 ENCOUNTER — Ambulatory Visit: Payer: Medicare HMO | Admitting: Physician Assistant

## 2019-01-29 MED ORDER — ALBUTEROL SULFATE (2.5 MG/3ML) 0.083% IN NEBU
2.50 | INHALATION_SOLUTION | RESPIRATORY_TRACT | Status: DC
Start: ? — End: 2019-01-29

## 2019-01-29 MED ORDER — MORPHINE SULFATE (PF) 4 MG/ML IV SOLN
2.00 | INTRAVENOUS | Status: DC
Start: ? — End: 2019-01-29

## 2019-01-29 MED ORDER — NITROGLYCERIN 0.4 MG SL SUBL
.40 | SUBLINGUAL_TABLET | SUBLINGUAL | Status: DC
Start: ? — End: 2019-01-29

## 2019-01-29 MED ORDER — EZETIMIBE 10 MG PO TABS
10.00 | ORAL_TABLET | ORAL | Status: DC
Start: 2019-01-28 — End: 2019-01-29

## 2019-01-29 MED ORDER — SODIUM CHLORIDE 0.9 % IV SOLN
10.00 | INTRAVENOUS | Status: DC
Start: ? — End: 2019-01-29

## 2019-01-29 MED ORDER — ACETAMINOPHEN 325 MG PO TABS
650.00 | ORAL_TABLET | ORAL | Status: DC
Start: ? — End: 2019-01-29

## 2019-01-29 MED ORDER — SERTRALINE HCL 100 MG PO TABS
200.00 | ORAL_TABLET | ORAL | Status: DC
Start: 2019-01-28 — End: 2019-01-29

## 2019-01-29 MED ORDER — ALUM & MAG HYDROXIDE-SIMETH 200-200-20 MG/5ML PO SUSP
30.00 | ORAL | Status: DC
Start: ? — End: 2019-01-29

## 2019-01-29 MED ORDER — POLYETHYLENE GLYCOL 3350 17 G PO PACK
17.00 | PACK | ORAL | Status: DC
Start: 2019-01-29 — End: 2019-01-29

## 2019-01-29 MED ORDER — CLONAZEPAM 0.5 MG PO TABS
0.50 | ORAL_TABLET | ORAL | Status: DC
Start: ? — End: 2019-01-29

## 2019-01-29 MED ORDER — TRAZODONE HCL 100 MG PO TABS
200.00 | ORAL_TABLET | ORAL | Status: DC
Start: 2019-01-28 — End: 2019-01-29

## 2019-01-29 MED ORDER — TEMAZEPAM 15 MG PO CAPS
15.00 | ORAL_CAPSULE | ORAL | Status: DC
Start: 2019-01-28 — End: 2019-01-29

## 2019-01-29 MED ORDER — ARIPIPRAZOLE 2 MG PO TABS
1.00 | ORAL_TABLET | ORAL | Status: DC
Start: 2019-01-29 — End: 2019-01-29

## 2019-01-29 MED ORDER — GENERIC EXTERNAL MEDICATION
.50 | Status: DC
Start: ? — End: 2019-01-29

## 2019-01-29 MED ORDER — GENERIC EXTERNAL MEDICATION
4.00 | Status: DC
Start: ? — End: 2019-01-29

## 2019-01-29 MED ORDER — WARFARIN SODIUM 1 MG PO TABS
ORAL_TABLET | ORAL | Status: DC
Start: ? — End: 2019-01-29

## 2019-01-29 MED ORDER — PANTOPRAZOLE SODIUM 40 MG PO TBEC
40.00 | DELAYED_RELEASE_TABLET | ORAL | Status: DC
Start: 2019-01-28 — End: 2019-01-29

## 2019-01-29 MED ORDER — HYDRALAZINE HCL 20 MG/ML IJ SOLN
10.00 | INTRAMUSCULAR | Status: DC
Start: ? — End: 2019-01-29

## 2019-01-29 MED ORDER — OXYCODONE HCL 5 MG PO TABS
10.00 | ORAL_TABLET | ORAL | Status: DC
Start: ? — End: 2019-01-29

## 2019-01-29 MED ORDER — NICOTINE 21 MG/24HR TD PT24
1.00 | MEDICATED_PATCH | TRANSDERMAL | Status: DC
Start: 2019-01-29 — End: 2019-01-29

## 2019-01-29 MED ORDER — ALBUTEROL SULFATE HFA 108 (90 BASE) MCG/ACT IN AERS
2.00 | INHALATION_SPRAY | RESPIRATORY_TRACT | Status: DC
Start: ? — End: 2019-01-29

## 2019-01-29 MED ORDER — ROSUVASTATIN CALCIUM 40 MG PO TABS
40.00 | ORAL_TABLET | ORAL | Status: DC
Start: 2019-01-28 — End: 2019-01-29

## 2019-01-29 MED ORDER — GENERIC EXTERNAL MEDICATION
10.00 | Status: DC
Start: ? — End: 2019-01-29

## 2019-01-29 MED ORDER — ISOSORBIDE MONONITRATE ER 60 MG PO TB24
60.00 | ORAL_TABLET | ORAL | Status: DC
Start: 2019-01-29 — End: 2019-01-29

## 2019-01-29 MED ORDER — CARVEDILOL 3.125 MG PO TABS
3.13 | ORAL_TABLET | ORAL | Status: DC
Start: 2019-01-28 — End: 2019-01-29

## 2019-01-31 ENCOUNTER — Encounter: Payer: Self-pay | Admitting: Physician Assistant

## 2019-01-31 ENCOUNTER — Ambulatory Visit (INDEPENDENT_AMBULATORY_CARE_PROVIDER_SITE_OTHER): Payer: Medicare HMO | Admitting: Physician Assistant

## 2019-01-31 VITALS — BP 130/68 | Ht 63.0 in | Wt 171.0 lb

## 2019-01-31 DIAGNOSIS — Z87891 Personal history of nicotine dependence: Secondary | ICD-10-CM | POA: Diagnosis not present

## 2019-01-31 DIAGNOSIS — D62 Acute posthemorrhagic anemia: Secondary | ICD-10-CM | POA: Diagnosis not present

## 2019-01-31 DIAGNOSIS — K921 Melena: Secondary | ICD-10-CM

## 2019-01-31 DIAGNOSIS — E876 Hypokalemia: Secondary | ICD-10-CM

## 2019-01-31 LAB — POCT HEMOGLOBIN: Hemoglobin: 7.7 g/dL — AB (ref 11–14.6)

## 2019-01-31 LAB — POCT INR: INR: 2.9 (ref 2.0–3.0)

## 2019-01-31 MED ORDER — METHYLPREDNISOLONE ACETATE 40 MG/ML IJ SUSP
40.0000 mg | Freq: Once | INTRAMUSCULAR | Status: AC
  Administered 2019-01-31: 40 mg via INTRAMUSCULAR

## 2019-01-31 MED ORDER — NICOTINE 21 MG/24HR TD PT24: 21.0000 mg | MEDICATED_PATCH | Freq: Every day | TRANSDERMAL | 0 refills | Status: DC

## 2019-01-31 MED ORDER — FERROUS SULFATE 325 (65 FE) MG PO TBEC: 325.0000 mg | DELAYED_RELEASE_TABLET | Freq: Every day | ORAL | 1 refills | Status: AC

## 2019-01-31 NOTE — Progress Notes (Signed)
Subjective:    Patient ID: Janet Joyce, female    DOB: 1956-07-17, 63 y.o.   MRN: [id number]  HPI  Pt is a 63 yo female with Atrial fibrillation, CAD, HTN, GERD, chronic pain who presents to the clinic to follow up after hospital admission on 2/8-2/15 for GI bleed with melena due to gastric ulcer.   She was seen the day before admission for sinobronchitis and elevated INR. EGD was done in hospital with cauterization of ulcers. She was started on protonix. She was taken off potassium, lasix, oxycodone.  Hemoglobin upon arrival was 4.7. she was discharged at 6.9.  She has GI appt on apirl 29th. Potassium 3.8.  She is very tired. She denies any melena.      .. Active Ambulatory Problems    Diagnosis Date Noted  . CAD (coronary artery disease) 01/31/2013  . Hyperlipidemia LDL goal <70 01/31/2013  . H/O aortic valve replacement 01/31/2013  . GERD (gastroesophageal reflux disease) 01/31/2013  . S/P CABG (coronary artery bypass graft) 01/31/2013  . Anxiety and depression 01/31/2013  . IBS (irritable bowel syndrome) 01/31/2013  . Status post lung surgery 01/31/2013  . Chronic back pain 01/31/2013  . Tobacco use disorder 01/31/2013  . Bilateral knee pain 01/31/2013  . Hypertriglyceridemia 02/07/2013  . Atrial fibrillation ([place]) 02/20/2013  . Prediabetes 02/21/2013  . Encounter for monitoring Coumadin therapy 03/10/2013  . Urinary incontinence 04/21/2013  . Overactive bladder 04/21/2013  . Lumbago 05/16/2013  . Orthostatic hypotension 07/04/2013  . Essential hypertension, benign 08/04/2013  . Iliotibial band syndrome 11/06/2013  . Solitary pulmonary nodule 09/03/2014  . Acute bronchitis 07/03/2015  . Elevated INR 07/03/2015  . Allergic conjunctivitis 11/01/2015  . Humeral head fracture 11/11/2015  . Decreased diffusion capacity of lung 07/21/2017  . SOB (shortness of breath) 07/28/2017  . Elevated TSH 08/19/2017  . Tricuspid regurgitation 11/30/2017  . Grade II diastolic  dysfunction 42/70/6237  . Pancreatic cyst 01/07/2018  . Fungal toenail infection 01/30/2018  . Ingrown toenail of left foot 01/30/2018  . Encounter for chronic pain management 04/08/2018  . Edema 04/27/2018  . Positive colorectal cancer screening using Cologuard test 06/24/2018  . Hair loss 06/24/2018  . Gastrointestinal hemorrhage with melena 02/01/2019  . Former smoker 02/01/2019  . Anemia associated with acute blood loss 02/01/2019   Resolved Ambulatory Problems    Diagnosis Date Noted  . Hypokalemia 01/31/2013  . COPD exacerbation ([place]) 08/24/2014  . Emphysema of lung ([place]) 09/03/2014  . Candidal intertrigo 01/28/2017   Past Medical History:  Diagnosis Date  . CHF (congestive heart failure) ([place])   . Depression   . Heart attack ([place])   . Hyperlipidemia          Review of Systems See HPI.     Objective:   Physical Exam Vitals signs reviewed.  Constitutional:      Appearance: Normal appearance.  HENT:     Head: Normocephalic and atraumatic.  Cardiovascular:     Rate and Rhythm: Normal rate.  Pulmonary:     Effort: Pulmonary effort is normal.     Breath sounds: Normal breath sounds. No rhonchi.  Skin:    Coloration: Skin is pale.  Neurological:     General: No focal deficit present.     Mental Status: She is alert.  Psychiatric:        Mood and Affect: Mood normal.           Assessment & Plan:  [name] was seen today for hospitalization follow-up.  Diagnoses and all orders for this visit:  Gastrointestinal hemorrhage with melena -     POCT INR -     POCT hemoglobin -     methylPREDNISolone acetate (DEPO-MEDROL) injection 40 mg  Former smoker -     nicotine (NICODERM CQ) 21 mg/24hr patch; Place 1 patch (21 mg total) onto the skin daily. -     methylPREDNISolone acetate (DEPO-MEDROL) injection 40 mg  Anemia associated with acute blood loss -     ferrous sulfate 325 (65 FE) MG EC tablet; Take 1 tablet (325 mg total) by mouth daily with  breakfast. -     methylPREDNISolone acetate (DEPO-MEDROL) injection 40 mg  Hypokalemia -     BASIC METABOLIC PANEL WITH GFR -     methylPREDNISolone acetate (DEPO-MEDROL) injection 40 mg   Range for INR is 2.5 to 3.5   .. Results for orders placed or performed in visit on 81/44/81  BASIC METABOLIC PANEL WITH GFR  Result Value Ref Range   Glucose, Bld 102 (H) 65 - 99 mg/dL   BUN 8 7 - 25 mg/dL   Creat 0.71 0.50 - 0.99 mg/dL   GFR, Est Non African American 93 > OR = 60 mL/min/1.64m2   GFR, Est African American 107 > OR = 60 mL/min/1.9m2   BUN/Creatinine Ratio NOT APPLICABLE 6 - 22 (calc)   Sodium 139 135 - 146 mmol/L   Potassium 4.0 3.5 - 5.3 mmol/L   Chloride 106 98 - 110 mmol/L   CO2 29 20 - 32 mmol/L   Calcium 8.3 (L) 8.6 - 10.4 mg/dL  POCT INR  Result Value Ref Range   INR 2.9 2.0 - 3.0  POCT hemoglobin  Result Value Ref Range   Hemoglobin 7.7 (A) 11 - 14.6 g/dL   INR looks great. Close follow up stay on 1mg  daily. Recheck in 1 week.   HgB rising. Should continue to rise. Given ferrous sulfate to take only in the morning at breakfast. Warned about constipation but hoping this will help with fatigue.   Checked potassium since taken off potassium in hospital.   Overall patient is doing better but tired. Hoping once anemia improves this will change.   Continue to stay not smoking. Refilled patches. Once no cigarettes for 2 months will decrease to 14mg  patches.

## 2019-01-31 NOTE — Patient Instructions (Signed)
Start ferrous sulfate in the morning with breakfast.  Continue 1mg  of coumadin daily.  Get potassium checked.  Continue on protonix twice a day.   Follow up in 1 week.

## 2019-02-01 ENCOUNTER — Telehealth: Payer: Self-pay | Admitting: Physician Assistant

## 2019-02-01 ENCOUNTER — Encounter: Payer: Self-pay | Admitting: Physician Assistant

## 2019-02-01 DIAGNOSIS — D62 Acute posthemorrhagic anemia: Secondary | ICD-10-CM | POA: Insufficient documentation

## 2019-02-01 DIAGNOSIS — K921 Melena: Secondary | ICD-10-CM | POA: Insufficient documentation

## 2019-02-01 DIAGNOSIS — Z87891 Personal history of nicotine dependence: Secondary | ICD-10-CM | POA: Insufficient documentation

## 2019-02-01 LAB — BASIC METABOLIC PANEL WITH GFR
BUN: 8 mg/dL (ref 7–25)
CO2: 29 mmol/L (ref 20–32)
Calcium: 8.3 mg/dL — ABNORMAL LOW (ref 8.6–10.4)
Chloride: 106 mmol/L (ref 98–110)
Creat: 0.71 mg/dL (ref 0.50–0.99)
GFR, Est African American: 107 mL/min/{1.73_m2} (ref >=60)
GFR, Est Non African American: 93 mL/min/{1.73_m2} (ref >=60)
Glucose, Bld: 102 mg/dL — ABNORMAL HIGH (ref 65–99)
Potassium: 4 mmol/L (ref 3.5–5.3)
Sodium: 139 mmol/L (ref 135–146)

## 2019-02-01 MED ORDER — ALBUTEROL SULFATE HFA 108 (90 BASE) MCG/ACT IN AERS: INHALATION_SPRAY | RESPIRATORY_TRACT | 0 refills | Status: AC

## 2019-02-01 NOTE — Telephone Encounter (Signed)
I would like for her to consider using less pain medication. Your were previously at three times a day. I would like for her to consider decreasing to 1/2 tablet twice a day.

## 2019-02-01 NOTE — Telephone Encounter (Addendum)
Pt called. She states that pharmacy has not gotten the script for inhaler. She also said, she had to go back on her pain meds and she apologizes for having to do this.  Thanks

## 2019-02-01 NOTE — Progress Notes (Signed)
Call pt: kidney function is great and potassium is wonderful. For now stay on potassium.

## 2019-02-01 NOTE — Addendum Note (Signed)
Addended by: Alena Bills R on: 02/01/2019 01:59 PM   Modules accepted: Orders

## 2019-02-01 NOTE — Telephone Encounter (Signed)
Inhaler sent. Routing to PCP regarding pain Rx

## 2019-02-02 NOTE — Telephone Encounter (Signed)
Pt advised. She is willing to try the decreased dose. Routing for Rx to be sent.

## 2019-02-03 MED ORDER — OXYCODONE-ACETAMINOPHEN 10-325 MG PO TABS: 1.0000 | ORAL_TABLET | Freq: Two times a day (BID) | ORAL | 0 refills | Status: DC | PRN

## 2019-02-03 NOTE — Addendum Note (Signed)
Addended by: Donella Stade on: 02/03/2019 09:57 AM   Modules accepted: Orders

## 2019-02-03 NOTE — Telephone Encounter (Signed)
Sent!

## 2019-02-03 NOTE — Telephone Encounter (Signed)
Pt advised, she does have some Rx at home and is OK to wait until 02/09/19.   Do you want to send dated Rx with updated directions and quantity?

## 2019-02-03 NOTE — Telephone Encounter (Signed)
Pt is not due for refill until 2/27 at old dose. If she was taken off in hospital she should have plenty.   [name] KitchenMarland KitchenPDMP reviewed during this encounter.

## 2019-02-07 ENCOUNTER — Ambulatory Visit (INDEPENDENT_AMBULATORY_CARE_PROVIDER_SITE_OTHER): Payer: Medicare HMO | Admitting: Physician Assistant

## 2019-02-07 ENCOUNTER — Encounter: Payer: Self-pay | Admitting: Physician Assistant

## 2019-02-07 VITALS — BP 120/64 | HR 60 | Temp 97.9°F | Resp 16 | Ht 63.0 in | Wt 171.0 lb

## 2019-02-07 DIAGNOSIS — G8929 Other chronic pain: Secondary | ICD-10-CM | POA: Diagnosis not present

## 2019-02-07 DIAGNOSIS — I480 Paroxysmal atrial fibrillation: Secondary | ICD-10-CM

## 2019-02-07 DIAGNOSIS — K921 Melena: Secondary | ICD-10-CM

## 2019-02-07 DIAGNOSIS — M545 Low back pain: Secondary | ICD-10-CM | POA: Diagnosis not present

## 2019-02-07 LAB — POCT INR: INR: 2.6 (ref 2.0–3.0)

## 2019-02-07 LAB — POCT HEMOGLOBIN: Hemoglobin: 11 g/dL (ref 11–14.6)

## 2019-02-07 MED ORDER — OXYCODONE-ACETAMINOPHEN 10-325 MG PO TABS: 1.0000 | ORAL_TABLET | Freq: Two times a day (BID) | ORAL | 0 refills | Status: DC | PRN

## 2019-02-07 NOTE — Patient Instructions (Addendum)
Keep on same dose at 1mg  daily. Follow up in 2 weeks.

## 2019-02-07 NOTE — Progress Notes (Signed)
Subjective:    Patient ID: Janet Joyce, female    DOB: 1956-05-28, 63 y.o.   MRN: [id number]  HPI  Janet Joyce is a 63 yo female with Atrial fibrillation, CAD, HTN, GERD, chronic pain who presents to the clinic to follow up after hospital admission on 2/8-2/15 for GI bleed with melena due to gastric ulcer.  She states she is feeling much better. She states her fatigue is improving everyday but she still has lightheadedness intermittently. Patient continues to not smoke and states the patches are working well for her. Her shortness of breath has improved significantly.   Patient mentioned her stools have gotten darker but this is likely a side effect of iron tablets. She denies abdominal pain, change in size or shape of stools, melena or bright red blood in the stool.   Patient still complaining of chronic back pain. She was unable to try to cut her pain medicine to 1/2 a tablet BID she states she is back to taking three tablets a day. She feels she is only able to function well at that dose.   .. Active Ambulatory Problems    Diagnosis Date Noted  . CAD (coronary artery disease) 01/31/2013  . Hyperlipidemia LDL goal <70 01/31/2013  . H/O aortic valve replacement 01/31/2013  . GERD (gastroesophageal reflux disease) 01/31/2013  . S/P CABG (coronary artery bypass graft) 01/31/2013  . Anxiety and depression 01/31/2013  . IBS (irritable bowel syndrome) 01/31/2013  . Status post lung surgery 01/31/2013  . Chronic back pain 01/31/2013  . Tobacco use disorder 01/31/2013  . Bilateral knee pain 01/31/2013  . Hypertriglyceridemia 02/07/2013  . Atrial fibrillation ([place]) 02/20/2013  . Prediabetes 02/21/2013  . Encounter for monitoring Coumadin therapy 03/10/2013  . Urinary incontinence 04/21/2013  . Overactive bladder 04/21/2013  . Lumbago 05/16/2013  . Orthostatic hypotension 07/04/2013  . Essential hypertension, benign 08/04/2013  . Iliotibial band syndrome 11/06/2013  . Solitary pulmonary  nodule 09/03/2014  . Acute bronchitis 07/03/2015  . Elevated INR 07/03/2015  . Allergic conjunctivitis 11/01/2015  . Humeral head fracture 11/11/2015  . Decreased diffusion capacity of lung 07/21/2017  . SOB (shortness of breath) 07/28/2017  . Elevated TSH 08/19/2017  . Tricuspid regurgitation 11/30/2017  . Grade II diastolic dysfunction 26/83/4196  . Pancreatic cyst 01/07/2018  . Fungal toenail infection 01/30/2018  . Ingrown toenail of left foot 01/30/2018  . Encounter for chronic pain management 04/08/2018  . Edema 04/27/2018  . Positive colorectal cancer screening using Cologuard test 06/24/2018  . Hair loss 06/24/2018  . Gastrointestinal hemorrhage with melena 02/01/2019  . Former smoker 02/01/2019  . Anemia associated with acute blood loss 02/01/2019   Resolved Ambulatory Problems    Diagnosis Date Noted  . Hypokalemia 01/31/2013  . COPD exacerbation ([place]) 08/24/2014  . Emphysema of lung ([place]) 09/03/2014  . [place] intertrigo 01/28/2017   Past Medical History:  Diagnosis Date  . CHF (congestive heart failure) ([place])   . Depression   . Heart attack ([place])   . Hyperlipidemia      Review of Systems See HPI    Objective:   Physical Exam Constitutional:      Appearance: Normal appearance.  HENT:     Head: Normocephalic.     Mouth/Throat:     Pharynx: Oropharynx is clear.  Eyes:     Conjunctiva/sclera: Conjunctivae normal.     Pupils: Pupils are equal, round, and reactive to light.  Cardiovascular:     Rate and Rhythm: Normal rate and regular rhythm.  Pulses: Normal pulses.     Heart sounds: Normal heart sounds.  Pulmonary:     Effort: Pulmonary effort is normal.     Breath sounds: Normal breath sounds.  Skin:    General: Skin is warm.     Capillary Refill: Capillary refill takes less than 2 seconds.  Neurological:     General: No focal deficit present.     Mental Status: She is alert.  Psychiatric:        Mood and Affect: Mood normal.         Behavior: Behavior normal.           Assessment & Plan:  [name] KitchenMarland KitchenCanna was seen today for 1 wk follow up.  Diagnoses and all orders for this visit:  Gastrointestinal hemorrhage with melena -     POCT INR -     POCT hemoglobin  Paroxysmal atrial fibrillation (HCC)  Chronic bilateral low back pain without sciatica -     oxyCODONE-acetaminophen (PERCOCET) 10-325 MG tablet; Take 1 tablet by mouth every 12 (twelve) hours as needed for pain.  .. Results for orders placed or performed in visit on 02/07/19  POCT INR  Result Value Ref Range   INR 2.6 2.0 - 3.0  POCT hemoglobin  Result Value Ref Range   Hemoglobin 11 11 - 14.6 g/dL    INR range 2.5-3.5.   INR barely in normal range. Continue on same 1mg  daily dose. Recheck in 2 weeks.   hgB increase to 11. Great improvement. Continue on iron supplement for another 2 weeks.   Continue to use nicotine patches. Goal after cravings subside to decrease patch to 14mg  then 7mg  then stop using patches.   Discussed oxycodone use. She has been on TID for years. ED took her off. She tried lesser dose and feels like she cannot tolerate using less.   Last refill 01/09/2019. Written for 90 tablets.  [name] KitchenMarland KitchenPDMP reviewed during this encounter.        [name] KitchenVernetta Honey PA-C, have reviewed and agree with the above documentation in it's entirety.

## 2019-02-14 ENCOUNTER — Telehealth: Payer: Self-pay

## 2019-02-14 NOTE — Telephone Encounter (Signed)
Janet Joyce called and states the back pain is too much to handle. She did cut back on the Oxycodone to 1 in the morning and 1 at bedtime. She states the pain starts back up about 3 hours after taking the morning dose. She would like to at least go up to 3 tablets daily. Please advise.

## 2019-02-21 ENCOUNTER — Ambulatory Visit (INDEPENDENT_AMBULATORY_CARE_PROVIDER_SITE_OTHER): Payer: Medicare HMO | Admitting: Physician Assistant

## 2019-02-21 DIAGNOSIS — I4891 Unspecified atrial fibrillation: Secondary | ICD-10-CM

## 2019-02-21 DIAGNOSIS — Z951 Presence of aortocoronary bypass graft: Secondary | ICD-10-CM

## 2019-02-21 DIAGNOSIS — Z952 Presence of prosthetic heart valve: Secondary | ICD-10-CM

## 2019-02-21 LAB — POCT INR: INR: 1.1 — AB (ref 2.0–3.0)

## 2019-02-21 MED ORDER — PROMETHAZINE HCL 25 MG PO TABS: 25.0000 mg | ORAL_TABLET | Freq: Three times a day (TID) | ORAL | 0 refills | Status: AC | PRN

## 2019-02-21 NOTE — Telephone Encounter (Signed)
Verbal given to try to do twice a day and only the 3rd if absolutely needs. Norco is a high fall risk

## 2019-02-22 NOTE — Patient Instructions (Signed)
Increase dose to 1/2 tablet of (2mg ) M, W, F and 1 full tablet T, TH, Saturday, and Sunday. Follow up in 1 week.

## 2019-02-22 NOTE — Progress Notes (Signed)
Increase dose to 1/2 tablet of (2mg ) M, W, F and 1 full tablet T, TH, Saturday, and Sunday. Follow up in 1 week.   Iran Planas PA-C

## 2019-02-24 ENCOUNTER — Other Ambulatory Visit: Payer: Self-pay | Admitting: Physician Assistant

## 2019-02-24 DIAGNOSIS — Z87891 Personal history of nicotine dependence: Secondary | ICD-10-CM

## 2019-02-27 ENCOUNTER — Telehealth: Payer: Self-pay

## 2019-02-27 MED ORDER — PANTOPRAZOLE SODIUM 40 MG PO TBEC: 40.0000 mg | DELAYED_RELEASE_TABLET | Freq: Every day | ORAL | 0 refills | Status: AC

## 2019-02-27 NOTE — Telephone Encounter (Signed)
I was able to pull med onto med list.   Per provider, OK to send in 90 day supply.   Pt advised

## 2019-02-27 NOTE — Telephone Encounter (Signed)
ED put her on it too. Can we look in reconciled medications. Yes, I want her to stay on it.

## 2019-02-27 NOTE — Telephone Encounter (Signed)
Pt calls questioning if she is supposed to continue taking Protonix. If yes, she needs a new RX sent to pharmacy.  I do not see medication active on current med list, appears last RX was sent 2017?   Please advise

## 2019-02-28 ENCOUNTER — Ambulatory Visit (INDEPENDENT_AMBULATORY_CARE_PROVIDER_SITE_OTHER): Payer: Medicare HMO | Admitting: Physician Assistant

## 2019-02-28 ENCOUNTER — Other Ambulatory Visit: Payer: Self-pay

## 2019-02-28 DIAGNOSIS — I4891 Unspecified atrial fibrillation: Secondary | ICD-10-CM | POA: Diagnosis not present

## 2019-02-28 DIAGNOSIS — Z952 Presence of prosthetic heart valve: Secondary | ICD-10-CM

## 2019-02-28 DIAGNOSIS — Z951 Presence of aortocoronary bypass graft: Secondary | ICD-10-CM

## 2019-02-28 LAB — POCT INR: INR: 1.2 — AB (ref 2.0–3.0)

## 2019-02-28 NOTE — Progress Notes (Signed)
increase dose 1mg  Wednesday and 2mg  all other days. Recheck in 2 weeks.

## 2019-02-28 NOTE — Progress Notes (Signed)
Pt advised. Follow up scheduled

## 2019-02-28 NOTE — Patient Instructions (Addendum)
increase dose 1mg  Wednesday and 2mg  all other days. Recheck in 2 weeks.

## 2019-03-03 ENCOUNTER — Telehealth: Payer: Self-pay

## 2019-03-03 DIAGNOSIS — M545 Low back pain, unspecified: Secondary | ICD-10-CM

## 2019-03-03 DIAGNOSIS — G8929 Other chronic pain: Secondary | ICD-10-CM

## 2019-03-03 MED ORDER — OXYCODONE-ACETAMINOPHEN 10-325 MG PO TABS: 1.0000 | ORAL_TABLET | Freq: Three times a day (TID) | ORAL | 0 refills | Status: AC | PRN

## 2019-03-03 NOTE — Telephone Encounter (Signed)
Pt calls in for pain medication. Last fill was 2/27 for #60. She has been taking 3 times a day but less than 4 times a day. Sent refill.   [name] KitchenMarland KitchenPDMP reviewed during this encounter.

## 2019-03-03 NOTE — Telephone Encounter (Signed)
Janet Joyce called and states she is out of her medication of Oxycodone. She states she is needing to take it 3 times daily. Please advise.

## 2019-03-14 ENCOUNTER — Other Ambulatory Visit: Payer: Self-pay

## 2019-03-14 ENCOUNTER — Ambulatory Visit (INDEPENDENT_AMBULATORY_CARE_PROVIDER_SITE_OTHER): Payer: Medicare HMO | Admitting: Physician Assistant

## 2019-03-14 DIAGNOSIS — I4891 Unspecified atrial fibrillation: Secondary | ICD-10-CM

## 2019-03-14 DIAGNOSIS — Z951 Presence of aortocoronary bypass graft: Secondary | ICD-10-CM

## 2019-03-14 DIAGNOSIS — Z952 Presence of prosthetic heart valve: Secondary | ICD-10-CM

## 2019-03-14 LAB — POCT INR: INR: 1.6 — AB (ref 2.0–3.0)

## 2019-03-14 NOTE — Patient Instructions (Signed)
Increase dose.  2mg  every day. Recheck in 2 weeks.

## 2019-03-14 NOTE — Progress Notes (Signed)
Increase dose.  2mg  every day. Recheck in 2 weeks.

## 2019-03-14 NOTE — Progress Notes (Signed)
Pt advised and scheduled.

## 2019-03-15 DIAGNOSIS — R001 Bradycardia, unspecified: Secondary | ICD-10-CM | POA: Diagnosis not present

## 2019-03-15 DIAGNOSIS — R9431 Abnormal electrocardiogram [ECG] [EKG]: Secondary | ICD-10-CM | POA: Diagnosis not present

## 2019-03-15 DIAGNOSIS — R1111 Vomiting without nausea: Secondary | ICD-10-CM | POA: Diagnosis not present

## 2019-03-15 DIAGNOSIS — I213 ST elevation (STEMI) myocardial infarction of unspecified site: Secondary | ICD-10-CM | POA: Diagnosis not present

## 2019-03-15 DIAGNOSIS — R072 Precordial pain: Secondary | ICD-10-CM | POA: Diagnosis not present

## 2019-03-15 DIAGNOSIS — J439 Emphysema, unspecified: Secondary | ICD-10-CM | POA: Diagnosis not present

## 2019-03-15 DIAGNOSIS — R079 Chest pain, unspecified: Secondary | ICD-10-CM | POA: Diagnosis not present

## 2019-03-15 DIAGNOSIS — R112 Nausea with vomiting, unspecified: Secondary | ICD-10-CM | POA: Diagnosis not present

## 2019-03-15 DIAGNOSIS — R110 Nausea: Secondary | ICD-10-CM | POA: Diagnosis not present

## 2019-03-15 MED ORDER — GENERIC EXTERNAL MEDICATION
Status: DC
Start: ? — End: 2019-03-15

## 2019-03-15 MED ORDER — SODIUM CHLORIDE 0.9 % IV SOLN
10.00 | INTRAVENOUS | Status: DC
Start: ? — End: 2019-03-15

## 2019-03-16 DIAGNOSIS — T8131XA Disruption of external operation (surgical) wound, not elsewhere classified, initial encounter: Secondary | ICD-10-CM | POA: Diagnosis not present

## 2019-03-16 DIAGNOSIS — I214 Non-ST elevation (NSTEMI) myocardial infarction: Secondary | ICD-10-CM | POA: Diagnosis not present

## 2019-03-16 DIAGNOSIS — Z7901 Long term (current) use of anticoagulants: Secondary | ICD-10-CM | POA: Diagnosis not present

## 2019-03-16 DIAGNOSIS — R570 Cardiogenic shock: Secondary | ICD-10-CM | POA: Diagnosis not present

## 2019-03-16 DIAGNOSIS — I2582 Chronic total occlusion of coronary artery: Secondary | ICD-10-CM | POA: Diagnosis not present

## 2019-03-16 DIAGNOSIS — I213 ST elevation (STEMI) myocardial infarction of unspecified site: Secondary | ICD-10-CM | POA: Diagnosis not present

## 2019-03-16 DIAGNOSIS — R079 Chest pain, unspecified: Secondary | ICD-10-CM | POA: Diagnosis not present

## 2019-03-16 DIAGNOSIS — R7989 Other specified abnormal findings of blood chemistry: Secondary | ICD-10-CM | POA: Diagnosis not present

## 2019-03-16 DIAGNOSIS — I517 Cardiomegaly: Secondary | ICD-10-CM | POA: Diagnosis not present

## 2019-03-16 DIAGNOSIS — I25110 Atherosclerotic heart disease of native coronary artery with unstable angina pectoris: Secondary | ICD-10-CM | POA: Diagnosis not present

## 2019-03-16 DIAGNOSIS — I10 Essential (primary) hypertension: Secondary | ICD-10-CM | POA: Diagnosis not present

## 2019-03-16 DIAGNOSIS — Z452 Encounter for adjustment and management of vascular access device: Secondary | ICD-10-CM | POA: Diagnosis not present

## 2019-03-16 DIAGNOSIS — R0602 Shortness of breath: Secondary | ICD-10-CM | POA: Diagnosis not present

## 2019-03-16 DIAGNOSIS — Z955 Presence of coronary angioplasty implant and graft: Secondary | ICD-10-CM | POA: Diagnosis not present

## 2019-03-16 DIAGNOSIS — I5189 Other ill-defined heart diseases: Secondary | ICD-10-CM | POA: Diagnosis not present

## 2019-03-16 DIAGNOSIS — R0989 Other specified symptoms and signs involving the circulatory and respiratory systems: Secondary | ICD-10-CM | POA: Diagnosis not present

## 2019-03-16 DIAGNOSIS — I77810 Thoracic aortic ectasia: Secondary | ICD-10-CM | POA: Diagnosis not present

## 2019-03-16 DIAGNOSIS — J449 Chronic obstructive pulmonary disease, unspecified: Secondary | ICD-10-CM | POA: Diagnosis not present

## 2019-03-16 DIAGNOSIS — I2510 Atherosclerotic heart disease of native coronary artery without angina pectoris: Secondary | ICD-10-CM | POA: Diagnosis not present

## 2019-03-16 DIAGNOSIS — Z951 Presence of aortocoronary bypass graft: Secondary | ICD-10-CM | POA: Diagnosis not present

## 2019-03-16 DIAGNOSIS — R9431 Abnormal electrocardiogram [ECG] [EKG]: Secondary | ICD-10-CM | POA: Diagnosis not present

## 2019-03-16 DIAGNOSIS — D649 Anemia, unspecified: Secondary | ICD-10-CM | POA: Diagnosis not present

## 2019-03-16 DIAGNOSIS — J439 Emphysema, unspecified: Secondary | ICD-10-CM | POA: Diagnosis not present

## 2019-03-16 DIAGNOSIS — D62 Acute posthemorrhagic anemia: Secondary | ICD-10-CM | POA: Diagnosis not present

## 2019-03-16 DIAGNOSIS — Z952 Presence of prosthetic heart valve: Secondary | ICD-10-CM | POA: Diagnosis not present

## 2019-03-16 DIAGNOSIS — I480 Paroxysmal atrial fibrillation: Secondary | ICD-10-CM | POA: Diagnosis not present

## 2019-03-16 DIAGNOSIS — Y838 Other surgical procedures as the cause of abnormal reaction of the patient, or of later complication, without mention of misadventure at the time of the procedure: Secondary | ICD-10-CM | POA: Diagnosis not present

## 2019-03-16 DIAGNOSIS — R69 Illness, unspecified: Secondary | ICD-10-CM | POA: Diagnosis not present

## 2019-03-16 DIAGNOSIS — J90 Pleural effusion, not elsewhere classified: Secondary | ICD-10-CM | POA: Diagnosis not present

## 2019-03-16 DIAGNOSIS — I5041 Acute combined systolic (congestive) and diastolic (congestive) heart failure: Secondary | ICD-10-CM | POA: Diagnosis not present

## 2019-03-16 DIAGNOSIS — E785 Hyperlipidemia, unspecified: Secondary | ICD-10-CM | POA: Diagnosis not present

## 2019-03-16 DIAGNOSIS — R740 Nonspecific elevation of levels of transaminase and lactic acid dehydrogenase [LDH]: Secondary | ICD-10-CM | POA: Diagnosis not present

## 2019-03-16 DIAGNOSIS — I5043 Acute on chronic combined systolic (congestive) and diastolic (congestive) heart failure: Secondary | ICD-10-CM | POA: Diagnosis not present

## 2019-03-16 DIAGNOSIS — I519 Heart disease, unspecified: Secondary | ICD-10-CM | POA: Diagnosis not present

## 2019-03-16 DIAGNOSIS — I255 Ischemic cardiomyopathy: Secondary | ICD-10-CM | POA: Diagnosis not present

## 2019-03-16 DIAGNOSIS — I240 Acute coronary thrombosis not resulting in myocardial infarction: Secondary | ICD-10-CM | POA: Diagnosis not present

## 2019-03-16 DIAGNOSIS — R072 Precordial pain: Secondary | ICD-10-CM | POA: Diagnosis not present

## 2019-03-16 DIAGNOSIS — I083 Combined rheumatic disorders of mitral, aortic and tricuspid valves: Secondary | ICD-10-CM | POA: Diagnosis not present

## 2019-03-20 MED ORDER — ACETAMINOPHEN 325 MG PO TABS
650.00 | ORAL_TABLET | ORAL | Status: DC
Start: ? — End: 2019-03-20

## 2019-03-20 MED ORDER — GENERIC EXTERNAL MEDICATION
Status: DC
Start: ? — End: 2019-03-20

## 2019-03-20 MED ORDER — SODIUM CHLORIDE 0.9 % IV SOLN
INTRAVENOUS | Status: DC
Start: ? — End: 2019-03-20

## 2019-03-20 MED ORDER — ALPRAZOLAM 0.25 MG PO TABS
0.25 | ORAL_TABLET | ORAL | Status: DC
Start: ? — End: 2019-03-20

## 2019-03-20 MED ORDER — FERROUS SULFATE 325 (65 FE) MG PO TABS
325.00 | ORAL_TABLET | ORAL | Status: DC
Start: 2019-03-20 — End: 2019-03-20

## 2019-03-20 MED ORDER — MORPHINE SULFATE (PF) 2 MG/ML IV SOLN
2.00 | INTRAVENOUS | Status: DC
Start: ? — End: 2019-03-20

## 2019-03-20 MED ORDER — ROSUVASTATIN CALCIUM 40 MG PO TABS
40.00 | ORAL_TABLET | ORAL | Status: DC
Start: 2019-03-19 — End: 2019-03-20

## 2019-03-20 MED ORDER — SODIUM CHLORIDE 0.9 % IV SOLN
10.00 | INTRAVENOUS | Status: DC
Start: ? — End: 2019-03-20

## 2019-03-20 MED ORDER — MUPIROCIN 2 % EX OINT
TOPICAL_OINTMENT | CUTANEOUS | Status: DC
Start: 2019-03-19 — End: 2019-03-20

## 2019-03-20 MED ORDER — SENNA-DOCUSATE SODIUM 8.6-50 MG PO TABS
1.00 | ORAL_TABLET | ORAL | Status: DC
Start: ? — End: 2019-03-20

## 2019-03-20 MED ORDER — CLONAZEPAM 0.5 MG PO TABS
.50 | ORAL_TABLET | ORAL | Status: DC
Start: ? — End: 2019-03-20

## 2019-03-20 MED ORDER — NOREPINEPHRINE-DEXTROSE 8-5 MG/250ML-% IV SOLN
2.00 | INTRAVENOUS | Status: DC
Start: ? — End: 2019-03-20

## 2019-03-20 MED ORDER — ALUM & MAG HYDROXIDE-SIMETH 200-200-20 MG/5ML PO SUSP
15.00 | ORAL | Status: DC
Start: ? — End: 2019-03-20

## 2019-03-20 MED ORDER — TRAZODONE HCL 100 MG PO TABS
200.00 | ORAL_TABLET | ORAL | Status: DC
Start: 2019-03-19 — End: 2019-03-20

## 2019-03-20 MED ORDER — TEMAZEPAM 15 MG PO CAPS
15.00 | ORAL_CAPSULE | ORAL | Status: DC
Start: 2019-03-19 — End: 2019-03-20

## 2019-03-20 MED ORDER — OXYBUTYNIN CHLORIDE 5 MG PO TABS
5.00 | ORAL_TABLET | ORAL | Status: DC
Start: 2019-03-19 — End: 2019-03-20

## 2019-03-20 MED ORDER — SERTRALINE HCL 100 MG PO TABS
200.00 | ORAL_TABLET | ORAL | Status: DC
Start: 2019-03-20 — End: 2019-03-20

## 2019-03-20 MED ORDER — FLUTICASONE PROPIONATE 50 MCG/ACT NA SUSP
1.00 | NASAL | Status: DC
Start: 2019-03-20 — End: 2019-03-20

## 2019-03-20 MED ORDER — CLOPIDOGREL BISULFATE 75 MG PO TABS
75.00 | ORAL_TABLET | ORAL | Status: DC
Start: 2019-03-20 — End: 2019-03-20

## 2019-03-20 MED ORDER — EZETIMIBE 10 MG PO TABS
10.00 | ORAL_TABLET | ORAL | Status: DC
Start: 2019-03-19 — End: 2019-03-20

## 2019-03-20 MED ORDER — ALBUTEROL SULFATE (2.5 MG/3ML) 0.083% IN NEBU
2.50 | INHALATION_SOLUTION | RESPIRATORY_TRACT | Status: DC
Start: ? — End: 2019-03-20

## 2019-03-20 MED ORDER — WARFARIN SODIUM 1 MG PO TABS
ORAL_TABLET | ORAL | Status: DC
Start: ? — End: 2019-03-20

## 2019-03-20 MED ORDER — PANTOPRAZOLE SODIUM 40 MG PO TBEC
40.00 | DELAYED_RELEASE_TABLET | ORAL | Status: DC
Start: 2019-03-19 — End: 2019-03-20

## 2019-03-20 MED ORDER — NITROGLYCERIN 0.4 MG SL SUBL
0.40 | SUBLINGUAL_TABLET | SUBLINGUAL | Status: DC
Start: ? — End: 2019-03-20

## 2019-03-20 MED ORDER — ASPIRIN EC 81 MG PO TBEC
81.00 | DELAYED_RELEASE_TABLET | ORAL | Status: DC
Start: 2019-03-20 — End: 2019-03-20

## 2019-03-20 MED ORDER — METOCLOPRAMIDE HCL 5 MG/ML IJ SOLN
10.00 | INTRAMUSCULAR | Status: DC
Start: ? — End: 2019-03-20

## 2019-03-20 MED ORDER — ARIPIPRAZOLE 2 MG PO TABS
1.00 | ORAL_TABLET | ORAL | Status: DC
Start: 2019-03-20 — End: 2019-03-20

## 2019-03-20 MED ORDER — OXYCODONE HCL 10 MG PO TABS
10.00 | ORAL_TABLET | ORAL | Status: DC
Start: ? — End: 2019-03-20

## 2019-03-28 ENCOUNTER — Ambulatory Visit: Payer: Medicare HMO

## 2019-04-14 DEATH — deceased

## 2019-11-27 ENCOUNTER — Ambulatory Visit: Payer: Medicare HMO
